# Patient Record
Sex: Female | Born: 1995 | Hispanic: Yes | Marital: Married | Smoking: Never smoker
Health system: Southern US, Community
[De-identification: ages and names within clinical notes are randomized; demographics above are authoritative.]

## PROBLEM LIST (undated history)

## (undated) ENCOUNTER — Inpatient Hospital Stay (HOSPITAL_COMMUNITY): Payer: Self-pay

## (undated) DIAGNOSIS — Z789 Other specified health status: Secondary | ICD-10-CM

## (undated) DIAGNOSIS — F329 Major depressive disorder, single episode, unspecified: Secondary | ICD-10-CM

## (undated) DIAGNOSIS — F419 Anxiety disorder, unspecified: Secondary | ICD-10-CM

## (undated) DIAGNOSIS — O24419 Gestational diabetes mellitus in pregnancy, unspecified control: Secondary | ICD-10-CM

## (undated) DIAGNOSIS — K219 Gastro-esophageal reflux disease without esophagitis: Secondary | ICD-10-CM

## (undated) DIAGNOSIS — F32A Depression, unspecified: Secondary | ICD-10-CM

## (undated) DIAGNOSIS — E119 Type 2 diabetes mellitus without complications: Secondary | ICD-10-CM

## (undated) HISTORY — DX: Depression, unspecified: F32.A

## (undated) HISTORY — PX: NO PAST SURGERIES: SHX2092

## (undated) HISTORY — PX: UPPER GASTROINTESTINAL ENDOSCOPY: SHX188

## (undated) HISTORY — DX: Gastro-esophageal reflux disease without esophagitis: K21.9

## (undated) HISTORY — PX: COLONOSCOPY: SHX174

---
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
## 2013-06-29 ENCOUNTER — Ambulatory Visit: Payer: Self-pay

## 2013-06-30 ENCOUNTER — Ambulatory Visit: Payer: Self-pay

## 2013-07-28 ENCOUNTER — Ambulatory Visit: Payer: Self-pay

## 2013-11-17 ENCOUNTER — Encounter (HOSPITAL_COMMUNITY): Payer: Self-pay | Admitting: Emergency Medicine

## 2013-11-17 ENCOUNTER — Emergency Department (HOSPITAL_COMMUNITY)
Admission: EM | Admit: 2013-11-17 | Discharge: 2013-11-18 | Disposition: A | Discharge status: Home / Self Care | Payer: Self-pay | Attending: Emergency Medicine | Admitting: Emergency Medicine

## 2013-11-17 DIAGNOSIS — R1032 Left lower quadrant pain: Secondary | ICD-10-CM | POA: Insufficient documentation

## 2013-11-17 DIAGNOSIS — Z3202 Encounter for pregnancy test, result negative: Secondary | ICD-10-CM | POA: Insufficient documentation

## 2013-11-17 DIAGNOSIS — R10814 Left lower quadrant abdominal tenderness: Secondary | ICD-10-CM

## 2013-11-17 LAB — CBC WITH DIFFERENTIAL/PLATELET
BASOS ABS: 0 10*3/uL (ref 0.0–0.1)
BASOS PCT: 1 % (ref 0–1)
EOS PCT: 2 % (ref 0–5)
Eosinophils Absolute: 0.1 10*3/uL (ref 0.0–0.7)
HEMATOCRIT: 36.2 % (ref 36.0–46.0)
Hemoglobin: 11.8 g/dL — ABNORMAL LOW (ref 12.0–15.0)
LYMPHS PCT: 53 % — AB (ref 12–46)
Lymphs Abs: 3.3 10*3/uL (ref 0.7–4.0)
MCH: 27.7 pg (ref 26.0–34.0)
MCHC: 32.6 g/dL (ref 30.0–36.0)
MCV: 85 fL (ref 78.0–100.0)
MONO ABS: 0.5 10*3/uL (ref 0.1–1.0)
Monocytes Relative: 7 % (ref 3–12)
NEUTROS ABS: 2.3 10*3/uL (ref 1.7–7.7)
Neutrophils Relative %: 37 % — ABNORMAL LOW (ref 43–77)
PLATELETS: 165 10*3/uL (ref 150–400)
RBC: 4.26 MIL/uL (ref 3.87–5.11)
RDW: 14 % (ref 11.5–15.5)
WBC: 6.2 10*3/uL (ref 4.0–10.5)

## 2013-11-17 LAB — URINALYSIS, ROUTINE W REFLEX MICROSCOPIC
Bilirubin Urine: NEGATIVE
Glucose, UA: NEGATIVE mg/dL
Hgb urine dipstick: NEGATIVE
KETONES UR: NEGATIVE mg/dL
LEUKOCYTES UA: NEGATIVE
Nitrite: NEGATIVE
PH: 6.5 (ref 5.0–8.0)
PROTEIN: NEGATIVE mg/dL
SPECIFIC GRAVITY, URINE: 1.022 (ref 1.005–1.030)
UROBILINOGEN UA: 0.2 mg/dL (ref 0.0–1.0)

## 2013-11-17 LAB — BASIC METABOLIC PANEL
ANION GAP: 10 (ref 5–15)
BUN: 14 mg/dL (ref 6–23)
CALCIUM: 8.8 mg/dL (ref 8.4–10.5)
CHLORIDE: 106 meq/L (ref 96–112)
CO2: 23 meq/L (ref 19–32)
CREATININE: 0.49 mg/dL — AB (ref 0.50–1.10)
GFR calc non Af Amer: >90 mL/min (ref >90)
Glucose, Bld: 93 mg/dL (ref 70–99)
Potassium: 4.3 mEq/L (ref 3.7–5.3)
SODIUM: 139 meq/L (ref 137–147)

## 2013-11-17 LAB — POC URINE PREG, ED: Preg Test, Ur: NEGATIVE

## 2013-11-17 NOTE — ED Notes (Signed)
Some pain otherwise comfortable

## 2013-11-17 NOTE — ED Provider Notes (Signed)
CSN: [id number]     Arrival date & time 11/17/13  1810 History   First MD Initiated Contact with Patient 11/17/13 2206     No chief complaint on file.    (Consider location/radiation/quality/duration/timing/severity/associated sxs/prior Treatment) HPI Jamie Lutz is a 18 y.o. female without significant PMH presenting with intermittent daily sharp LLQ or RLQ abdominal pain since May 2014 after she gave birth to her second child. Pain lasts for an hour. Pt denies anything making it better or worse. Pt states pain is worsening today in LLQ starting around 8am without relief with ibuprofen causing her to leave work. Pt noted that she has increased urinary frequency but denies dysuria, foul smell to urine or difficulty urinating. Pain not associated with menstrual cycle. LMP 10/28/13. Pt with no increased vaginal discharge or odor. Pt does note dyspareunia. Last BM yesterday and normal. Non bloody. Patient eating and drinking like normal. [facility] and tested for UTI and was unremarkable. Pt followed [place] Dr. Christin BachJohn Ferguson [place].    Past Medical History  Diagnosis Date  . No pertinent past medical history    Past Surgical History  Procedure Laterality Date  . No past surgeries    . Perineal laceration repair  04/17/2011    Procedure: SUTURE REPAIR PERINEAL LACERATION;  Surgeon: Tilda BurrowJohn V Ferguson, MD;  Location: [facility];  Service: Gynecology;  Laterality: N/A;   Family History  Problem Relation Age of Onset  . Diabetes Father    History  Substance Use Topics  . Smoking status: Never Smoker   . Smokeless tobacco: Never Used  . Alcohol Use: No   OB History   Grav Para Term Preterm Abortions TAB SAB Ect Mult Living   2 2 2  0 0 0 0 0 0 2     Review of Systems  Constitutional: Negative for fever and chills.  HENT: Negative for congestion and rhinorrhea.   Eyes: Negative for visual disturbance.  Respiratory: Negative for cough and  shortness of breath.   Cardiovascular: Negative for chest pain and palpitations.  Gastrointestinal: Positive for abdominal pain. Negative for nausea, vomiting and diarrhea.  Genitourinary: Negative for dysuria and hematuria.  Musculoskeletal: Negative for back pain and gait problem.  Skin: Negative for rash.  Neurological: Negative for weakness and headaches.      Allergies  Review of patient's allergies indicates no known allergies.  Home Medications   Prior to Admission medications   Medication Sig Start Date End Date Taking? Authorizing Provider  etonogestrel (IMPLANON) 68 MG IMPL implant Inject 1 each into the skin once.    Historical Provider, MD   BP 98/60  Pulse 80  Temp(Src) 98.3 F (36.8 C) (Oral)  Resp 18  Ht 5\' 4"  (1.626 m)  Wt 138 lb (62.596 kg)  BMI 23.68 kg/m2  SpO2 99%  LMP 11/08/2013 Physical Exam  Nursing note and vitals reviewed. Constitutional: She appears well-developed and well-nourished. No distress.  HENT:  Head: Normocephalic and atraumatic.  Eyes: Conjunctivae and EOM are normal. Right eye exhibits no discharge. Left eye exhibits no discharge.  Cardiovascular: Normal rate, regular rhythm and normal heart sounds.   Pulmonary/Chest: Effort normal and breath sounds normal. No respiratory distress. She has no wheezes.  Abdominal: Soft. Bowel sounds are normal. She exhibits no distension.  Mild LLQ tenderness without rebound, guarding, rigidity. No CVA tenderness.  Genitourinary:  Cervix pink without lesions. Os closed. No CMT mild left adnexal tenderness. Minimal discharge  without foul odor. Nursing in room for exam.  Neurological: She is alert. She exhibits normal muscle tone. Coordination normal.  Skin: Skin is warm and dry. She is not diaphoretic.    ED Course  Procedures (including critical care time) Labs Review Labs Reviewed  URINALYSIS, ROUTINE W REFLEX MICROSCOPIC - Abnormal; Notable for the following:    APPearance CLOUDY (*)    All  other components within normal limits  CBC WITH DIFFERENTIAL - Abnormal; Notable for the following:    Hemoglobin 11.8 (*)    Neutrophils Relative % 37 (*)    Lymphocytes Relative 53 (*)    All other components within normal limits  BASIC METABOLIC PANEL - Abnormal; Notable for the following:    Creatinine, Ser 0.49 (*)    All other components within normal limits  WET PREP, GENITAL  GC/CHLAMYDIA PROBE AMP  RPR  HIV ANTIBODY (ROUTINE TESTING)  POC URINE PREG, ED    Imaging Review No results found.   EKG Interpretation None      MDM   Final diagnoses:  LLQ abdominal tenderness   Pt with chronic abdominal pain with acute worsening today in LLQ. Pt with VSS. Mild LLQ abdominal tenderness and pelvic without significant discharge, no CMT with mild L adnexal tenderness. Pt without leukocytosis and UA without signs of infection. Wet prep and transvaginal US to rule out torsion pending. If testing unremarkable patient to follow up with Dr. Christin BachJohn Ferguson, [facility] for follow up.   Sign out to Dr. Mora Bellmanni at shift change.     Louann SjogrenVictoria L Sheril Hammond, PA-C 11/18/13 [phone]

## 2013-11-17 NOTE — ED Notes (Signed)
Pt reports pain in lower abdomen since July 2014 with increased difficulty urinating. Pt states that the pain caused her to leave work today because it was too painful to stand. Pt states that [facility] and was tested for a UTI (results were negative for UTI). Pt denies hematuria.

## 2013-11-18 ENCOUNTER — Emergency Department (HOSPITAL_COMMUNITY): Payer: Medicaid Other

## 2013-11-18 LAB — WET PREP, GENITAL
Trich, Wet Prep: NONE SEEN
YEAST WET PREP: NONE SEEN

## 2013-11-18 LAB — RPR

## 2013-11-18 LAB — HIV ANTIBODY (ROUTINE TESTING W REFLEX): HIV: NONREACTIVE

## 2013-11-18 MED ORDER — IBUPROFEN 800 MG PO TABS
800.0000 mg | ORAL_TABLET | Freq: Once | ORAL | Status: AC
  Administered 2013-11-18: 800 mg via ORAL
  Filled 2013-11-18: qty 1

## 2013-11-18 NOTE — ED Notes (Signed)
Patient transported to Ultrasound 

## 2013-11-18 NOTE — Discharge Instructions (Signed)
Return to the emergency room with worsening of symptoms, new symptoms or with symptoms that are concerning. Follow up with your OBGYN Dr. Christin BachJohn Ferguson. Call to make appointment as soon as possible. Number above.   Dolor abdominal en las mujeres (Abdominal Pain, Women) El dolor abdominal (en el estmago, la pelvis o el vientre) puede tener muchas causas. Es importante que le informe a su mdico:  La ubicacin del Engineer, miningdolor.  Viene y se va, o persiste todo el tiempo?  Hay situaciones que Location managerinician el dolor (comer ciertos alimentos, la actividad fsica)?  Tiene otros sntomas asociados al dolor (fiebre, nuseas, vmitos, diarrea)? Todo es de gran ayuda cuando se trata de hallar la causa del dolor. CAUSAS  Estmago: Infecciones por virus o bacterias, o lcera.  Intestino: Apendicitis (apndice inflamado), ileitis regional (enfermedad de Crohn), colitis ulcerosa (colon inflamado), sndrome del colon irritable, diverticulitis (inflamacin de los divertculos del colon) o cncer de estmago oo intestino.  Enfermedades de la vescula biliar o clculos.  Enfermedades renales, clculos o infecciones en el rin.  Infeccin o cncer del pncreas.  Fibromialgia (trastorno doloroso)  Enfermedades de los rganos femeninos:  Uterus: tero: fibroma (tumor no canceroso) o infeccin  Trompas de Falopio: infeccin o embarazo ectpico  En los ovarios, quistes o tumores.  Adherencias plvicas (tejido cicatrizal).  Endometriosis (el tejido que cubre el tero se desarrolla en la pelvis y los rganos plvicos).  Sndrome de Agricultural engineercongestin plvica (los rganos femeninos se llenan de sangre antes del periodo menstrual(  Dolor durante el periodo menstrual.  Dolor durante la ovulacin (al producir vulos).  Dolor al usar el DIU (dispositivo intrauterino para el control de la natalidad)  Psychologist, clinicalCncer en los rganos femeninos.  Dolor funcional (no est originado en una enfermedad, puede mejorar sin  tratamiento).  Dolor de origen psicolgico  Depresin. DIAGNSTICO Su mdico decidir la gravedad del dolor a travs del examen fsico  Anlisis de sangre  Radiografas  Ecografas  TC (tomografa computada, tipo especial de radiografas).  IMR (resonancia magntica)  Cultivos, en el caso una infeccin  Colon por enema de bario (se inserta una sustancia de contraste en el intestino grueso para mejorar la observacin con rayos X.)  Colonoscopa (observacin del intestino con un tubo luminoso).  Laparoscopa (examen del interior del abdomen con un tubo que tiene Intel Corporationuna luz).  Ciruga exploratoria abdominal mayor (se observa el abdomen realizando una gran incisin). TRATAMIENTO El tratamiento depender de la causa del problema.   Muchos de estos casos pueden controlarse y tratarse en casa.  Medicamentos de venta libre indicados por el mdico.  Medicamentos con receta.  Antibiticos, en caso de infeccin  Pldoras anticonceptivas, en el caso de perodos dolorosos o dolor al ovular.  Tratamiento hormonal, para la endometriosis  Inyecciones para bloqueo nervioso selectivo.  Fisioterapia.  Antidepresivos.  Consejos por parte de un psclogo o psiquiatra.  Ciruga mayor o menor. INSTRUCCIONES PARA EL CUIDADO DOMICILIARIO  No tome ni administre laxantes a menos que se lo haya indicado su mdico.  Tome analgsicos de venta libre slo si se lo ha indicado el profesional que lo asiste. No tome aspirina, ya que puede causar Apple Computermolestias en el estmago o hemorragias.  Consuma una dieta lquida (caldo o agua) segn lo indicado por el mdico. Progrese lentamente a una dieta blanda, segn la tolerancia, si el dolor se relaciona con el estmago o el intestino.  Tenga un termmetro y tmese la temperatura varias veces al da.  Haga reposo en la cama y [place] Palm Estatesduerma, si esto  alivia el dolor.  Evite las relaciones sexuales, Counsellorsi le producen dolor.  Evite las situaciones  estresantes.  Cumpla con las visitas y los anlisis de control, segn las indicaciones de su mdico.  Si el dolor no se [place]ivia con los medicamentos o la [place]erciruga, [place]puede tratar con:  Acupuntura.  Ejercicios de relajacin (yoga, meditacin).  Terapia grupal.  Psicoterapia. SOLICITE ATENCIN MDICA SI:  Nota que ciertos Pharmacist, communityalimentos le producen dolor de [place] Gardensestmago.  El tratamiento indicado para Arboriculturistrealizar en el hogar no Marketing executivele alivia el dolor.  Necesita analgsicos ms fuertes.  Quiere que le retiren el DIU.  Si se siente confundido o desfalleciente.  Presenta nuseas o vmitos.  Aparece una erupcin cutnea.  Sufre efectos adversos o una reaccin alrgica debido a los medicamentos que toma. SOLICITE ATENCIN MDICA DE INMEDIATO SI:  El dolor persiste o se agrava.  Tiene fiebre.  Siente el dolor slo en algunos sectores del abdomen. Si se localiza en la zona derecha, posiblemente podra tratarse de apendicitis. En un adulto, si se localiza en la regin inferior izquierda del abdomen, podra tratarse de colitis o diverticulitis.  Hay sangre en las heces (deposiciones de color rojo brillante o negro alquitranado), con o sin vmitos.  Usted presenta sangre en la orina.  Siente escalofros con o sin fiebre.  Se desmaya. ASEGRESE QUE:   Comprende estas instrucciones.  Controlar su enfermedad.  Solicitar ayuda de inmediato si no mejora o si empeora. Document Released: 04/24/2008 Document Revised: 03/31/2011 [facility] Patient Information 2015 [employer], [place]. This information is not intended to replace advice given to you by your health care provider. Make sure you discuss any questions you have with your health care provider.

## 2013-11-18 NOTE — ED Provider Notes (Signed)
Patient signed out to me as pending transvaginal ultrasound to evaluate for left-sided torsion. She had acute left lower quadrant pain occurring at 8:30 PM this evening. She has a history of chronic bilateral pelvic pain however. She also had left adnexal tenderness on exam. Transvaginal ultrasound is negative for torsion or other abnormality. Upon my repeat assessment patient's abdomen is soft nontender in the left lower quadrant. I informed her to continue to history of pituitary surgery follow-up with her regular doctor regarding her chronic pelvic pain. Her vital signs remain within normal limits and she is safe for discharge.  Tomasita CrumbleAdeleke Calahan Pak, MD 11/18/13 [phone]

## 2013-11-18 NOTE — ED Notes (Signed)
Pelvic  Prods sent to lab  Waiting on the us tech

## 2013-11-18 NOTE — ED Provider Notes (Signed)
Medical screening examination/treatment/procedure(s) were conducted as a shared visit with non-physician practitioner(s) and myself.  I personally evaluated the patient during the encounter.   EKG Interpretation None       Results for orders placed during the hospital encounter of 11/17/13  URINALYSIS, ROUTINE W REFLEX MICROSCOPIC      Result Value Ref Range   Color, Urine YELLOW  YELLOW   APPearance CLOUDY (*) CLEAR   Specific Gravity, Urine 1.022  1.005 - 1.030   pH 6.5  5.0 - 8.0   Glucose, UA NEGATIVE  NEGATIVE mg/dL   Hgb urine dipstick NEGATIVE  NEGATIVE   Bilirubin Urine NEGATIVE  NEGATIVE   Ketones, ur NEGATIVE  NEGATIVE mg/dL   Protein, ur NEGATIVE  NEGATIVE mg/dL   Urobilinogen, UA 0.2  0.0 - 1.0 mg/dL   Nitrite NEGATIVE  NEGATIVE   Leukocytes, UA NEGATIVE  NEGATIVE  CBC WITH DIFFERENTIAL      Result Value Ref Range   WBC 6.2  4.0 - 10.5 K/uL   RBC 4.26  3.87 - 5.11 MIL/uL   Hemoglobin 11.8 (*) 12.0 - 15.0 g/dL   HCT 13.036.2  86.536.0 - 78.446.0 %   MCV 85.0  78.0 - 100.0 fL   MCH 27.7  26.0 - 34.0 pg   MCHC 32.6  30.0 - 36.0 g/dL   RDW 69.614.0  29.511.5 - 28.415.5 %   Platelets 165  150 - 400 K/uL   Neutrophils Relative % 37 (*) 43 - 77 %   Neutro Abs 2.3  1.7 - 7.7 K/uL   Lymphocytes Relative 53 (*) 12 - 46 %   Lymphs Abs 3.3  0.7 - 4.0 K/uL   Monocytes Relative 7  3 - 12 %   Monocytes Absolute 0.5  0.1 - 1.0 K/uL   Eosinophils Relative 2  0 - 5 %   Eosinophils Absolute 0.1  0.0 - 0.7 K/uL   Basophils Relative 1  0 - 1 %   Basophils Absolute 0.0  0.0 - 0.1 K/uL  BASIC METABOLIC PANEL      Result Value Ref Range   Sodium 139  137 - 147 mEq/L   Potassium 4.3  3.7 - 5.3 mEq/L   Chloride 106  96 - 112 mEq/L   CO2 23  19 - 32 mEq/L   Glucose, Bld 93  70 - 99 mg/dL   BUN 14  6 - 23 mg/dL   Creatinine, Ser 1.320.49 (*) 0.50 - 1.10 mg/dL   Calcium 8.8  8.4 - 44.010.5 mg/dL   GFR calc non Af Amer >90  >90 mL/min   GFR calc Af Amer >90  >90 mL/min   Anion gap 10  5 - 15  POC URINE PREG,  ED      Result Value Ref Range   Preg Test, Ur NEGATIVE  NEGATIVE   Patient with several month history of pelvic complaints. Patient with acute onset of pain in the pelvic area mostly on the left side today. Much more severe than usual. That started about 8:30 in the morning. Patient was [facility] yesterday tested for urinary tract infection and it was negative. Patient has been followed by Dr. Emelda FearFerguson at [facility] [place] in the past. He has helped her with her first pregnancy. Patient due to the pain on the left side of the pelvis the concern is for torsion. Certainly does not explain her chronic pain over the past several months. Ultrasound ordered to rule out  torsion. Wet prep pending. If ultrasound negative for torsion patient will be discharged home with follow-up with Dr. Emelda FearFerguson.  On examination normal bowel sounds lungs clear on both sides heart regular rate and rhythm without murmurs. Mild tenderness left side pelvis on abdominal palpation. Also reported by mid-level that there was left adnexal tenderness. No significant discharge.  Basic metabolic panel negative pregnancy test negative urinalysis negative for urinary tract infection. No leukocytosis and no significant anemia.  Vanetta MuldersScott Amberlie Gaillard, MD 11/18/13 [phone]

## 2013-11-19 LAB — GC/CHLAMYDIA PROBE AMP
CT Probe RNA: NEGATIVE
GC PROBE AMP APTIMA: NEGATIVE

## 2013-11-21 ENCOUNTER — Encounter (HOSPITAL_COMMUNITY): Payer: Self-pay | Admitting: Emergency Medicine

## 2013-11-23 ENCOUNTER — Ambulatory Visit: Payer: Self-pay | Admitting: Obstetrics & Gynecology

## 2014-02-02 ENCOUNTER — Ambulatory Visit (INDEPENDENT_AMBULATORY_CARE_PROVIDER_SITE_OTHER): Payer: Self-pay | Admitting: Pediatrics

## 2014-02-02 DIAGNOSIS — Z30013 Encounter for initial prescription of injectable contraceptive: Secondary | ICD-10-CM

## 2014-02-02 DIAGNOSIS — Z113 Encounter for screening for infections with a predominantly sexual mode of transmission: Secondary | ICD-10-CM

## 2014-02-02 DIAGNOSIS — Z638 Other specified problems related to primary support group: Secondary | ICD-10-CM

## 2014-02-02 DIAGNOSIS — IMO0001 Reserved for inherently not codable concepts without codable children: Secondary | ICD-10-CM

## 2014-02-02 DIAGNOSIS — Z3202 Encounter for pregnancy test, result negative: Secondary | ICD-10-CM

## 2014-02-02 LAB — POCT URINE PREGNANCY: PREG TEST UR: NEGATIVE

## 2014-02-02 MED ORDER — MEDROXYPROGESTERONE ACETATE 150 MG/ML IM SUSP
150.0000 mg | Freq: Once | INTRAMUSCULAR | Status: AC
  Administered 2014-02-02: 150 mg via INTRAMUSCULAR

## 2014-02-03 DIAGNOSIS — IMO0001 Reserved for inherently not codable concepts without codable children: Secondary | ICD-10-CM | POA: Insufficient documentation

## 2014-02-03 LAB — GC/CHLAMYDIA PROBE AMP, URINE
Chlamydia, Swab/Urine, PCR: NEGATIVE
GC Probe Amp, Urine: NEGATIVE

## 2014-02-03 NOTE — Progress Notes (Signed)
  Subjective:    Jamie Lutz is a 19 y.o. old female here with her self for birth control.   HPI Here at child's appt. Desires birth control. Has previously had Nexplanon but didn't like it (excessive bleeding) so had it removed. Currently uses condoms. LMP 01/08/14.  No unprotected sex since LMP. Last sex approx 2 weeks ago, used a condom.  Also with h/o of some lower pelvic pain. Some what difficult to quantify or qualify. On chart review, went to [facility] approx 18 months ago with pain and had normal u/s to r/o torsed ovary. [facility] but wondering if she can get care here.    Review of Systems  Genitourinary: Negative for vaginal bleeding, vaginal pain and menstrual problem.    Immunizations needed: vaccine record not available     Objective:    There were no vitals taken for this visit. Physical Exam  Constitutional: She appears well-nourished. No distress.  HENT:  Head: Normocephalic and atraumatic.  Eyes: Conjunctivae and EOM are normal. Right eye exhibits no discharge. Left eye exhibits no discharge.  Neck: Normal range of motion.  Pulmonary/Chest: No respiratory distress.  Skin: Skin is warm and dry. No rash noted.  Nursing note and vitals reviewed.      Assessment and Plan:     Jamie Lutz was seen today for contraceptive management. .   Problem List Items Addressed This Visit    None    Visit Diagnoses    Negative pregnancy test    -  Primary    Relevant Orders    POCT urine pregnancy (Completed)    GC/chlamydia probe amp, urine (Completed)    Ambulatory referral to Adolescent Medicine    Encounter for initial prescription of injectable contraceptive          Discussed options including Nexplanon, Depo and IUD.  Would consider IUD placement but would like to think about it. Referred to Dr Marina GoodellPerry for possible IUD placement.  In the meantime, will cover with Depo today.   Referred to Dr Marina GoodellPerry - appt scheduled  for next month - possible IUD placement.  Dory PeruBROWN,Ambrose Wile R, MD

## 2014-02-06 ENCOUNTER — Encounter: Payer: Self-pay | Admitting: Pediatrics

## 2014-02-06 ENCOUNTER — Ambulatory Visit (INDEPENDENT_AMBULATORY_CARE_PROVIDER_SITE_OTHER): Payer: Self-pay | Admitting: Pediatrics

## 2014-02-06 VITALS — BP 104/82 | Temp 97.7°F | Wt 140.0 lb

## 2014-02-06 DIAGNOSIS — R350 Frequency of micturition: Secondary | ICD-10-CM

## 2014-02-06 DIAGNOSIS — R202 Paresthesia of skin: Secondary | ICD-10-CM

## 2014-02-06 LAB — POCT URINALYSIS DIPSTICK
BILIRUBIN UA: NEGATIVE
GLUCOSE UA: NORMAL
Ketones, UA: NEGATIVE
LEUKOCYTES UA: NEGATIVE
NITRITE UA: NEGATIVE
Protein, UA: NEGATIVE
RBC UA: NEGATIVE
Spec Grav, UA: 1.01
Urobilinogen, UA: NEGATIVE
pH, UA: 5

## 2014-02-06 LAB — CBC
HEMATOCRIT: 40.1 % (ref 36.0–46.0)
HEMOGLOBIN: 13.4 g/dL (ref 12.0–15.0)
MCH: 28 pg (ref 26.0–34.0)
MCHC: 33.4 g/dL (ref 30.0–36.0)
MCV: 83.7 fL (ref 78.0–100.0)
MPV: 11.1 fL (ref 8.6–12.4)
Platelets: 179 10*3/uL (ref 150–400)
RBC: 4.79 MIL/uL (ref 3.87–5.11)
RDW: 14.4 % (ref 11.5–15.5)
WBC: 6.8 10*3/uL (ref 4.0–10.5)

## 2014-02-06 NOTE — Progress Notes (Signed)
Subjective:    Jamie Lutz is a 19 y.o. old female here for Numbness .    HPI Comments: Pt comes in today for paresthesia in arms and legs that have been occuring for past 2 weeks. She report "pin and needle" sensation in her distal extremities a few times a day mainly when sitting on toilet or chairs that last for a few minutes and resolves with movement. Denies any numbness or weakness. She also reports multiple other problems. Freq urination that has been occuring for ~ 2 years without dysuria, polydipsia, or nocturia. Left pelvic pain x 10 months that is dull constant and helped some by mobic. Mild HA almost daily without photophobia, phonophobia or aura and doesn't need treatment. Also well as Dizziness a few times a week. On further questioning she report panic attacks that occur several times a week: extreme anxiety without known triggers, palpitation, trouble breathing - which last for 5-10 minutes and resolves without treatment. She says this started ~ 1.5 yrs ago during her second pregnancy and has been associated with cold intolerance, mild constipation and weight gain. Denies hx of thyroid problems, but sister has unknown thyroid issues.     Review of Systems  Constitutional: Negative for fever, appetite change and unexpected weight change.  Respiratory: Negative for cough, chest tightness and shortness of breath.   Cardiovascular: Positive for palpitations. Negative for chest pain.  Gastrointestinal: Positive for abdominal pain and constipation. Negative for nausea, vomiting and diarrhea.  Endocrine: Positive for cold intolerance and polyuria. Negative for heat intolerance, polydipsia and polyphagia.  Genitourinary: Positive for frequency. Negative for dysuria, difficulty urinating and menstrual problem.  Musculoskeletal: Positive for myalgias. Negative for arthralgias.  Skin: Negative for rash.  Neurological: Positive for dizziness and headaches. Negative for weakness and numbness.   Psychiatric/Behavioral: Positive for decreased concentration. Negative for dysphoric mood. The patient is nervous/anxious.     History and Problem List: Jamie Lutz has Teenage mother and Paresthesia on her problem list.  Jamie Lutz  has a past medical history of No pertinent past medical history.  Immunizations needed: none     Objective:    BP 104/82 mmHg  Temp(Src) 97.7 F (36.5 C)  Wt 140 lb (63.504 kg) Physical Exam  Constitutional: She is oriented to person, place, and time. She appears well-developed and well-nourished. No distress.  HENT:  Mouth/Throat: Oropharynx is clear and moist.  Eyes: Conjunctivae and EOM are normal. Pupils are equal, round, and reactive to light.  Neck: Normal range of motion. Neck supple. No thyromegaly present.  Cardiovascular: Normal rate, regular rhythm and normal heart sounds.   No murmur heard. Pulmonary/Chest: Effort normal and breath sounds normal.  Abdominal: Soft. Bowel sounds are normal. She exhibits no distension.  Mild LLQ tenderness   Musculoskeletal: Normal range of motion. She exhibits no edema or tenderness.  Neurological: She is alert and oriented to person, place, and time. No cranial nerve deficit. Coordination normal.  Upper and lower sensation and strength grossly intact   Skin: Skin is warm. She is not diaphoretic.       Assessment and Plan:     Jamie Lutz was seen today for numbness which she describes as paresthesias. Also with multiple other complaints. Chronic pelvic pain, panic attacks, anxiety, urinary frequency, and mild daily HA. No decreased sensory or strength that would be concerning for neuromuscular disease. No murmur concern for mitral valve prolapse. Constellation of symptoms likely due to anxiety, but will check TSH to assess thyroid and CBC, BMP to assess for  anemia or electrolyte abnormalities. Low suspicion for rheumatological pathology; will check ESR which if wnl would be reassuring. Recommend miralax to treat  constipation which could be contributing to LLQ pain and urinary frequency. We have her f/u with Jamie Lutz as scheduled to discuss lab results.  If no lab abnormalities found would consider treating with SSRI.   Problem List Items Addressed This Visit      Other   Paresthesia   Relevant Orders   CBC   Basic metabolic panel   TSH   Ferritin   Sedimentation rate    Other Visit Diagnoses    Frequency of urination    -  Primary    Relevant Orders    POCT urinalysis dipstick (Completed)       Return in about 1 month (around 03/09/2014).  Phill Myron, MD

## 2014-02-06 NOTE — Patient Instructions (Signed)
I have order some labs today to assess your numbness. You should follow-up with Dr Marina GoodellPerry as already scheduled to discuss this results. For you occasional constipation you can use over the counter Miralax as needed.

## 2014-02-06 NOTE — Progress Notes (Signed)
I reviewed the resident's note and agree with the findings and plan. Corran Lalone, PPCNP-BC  

## 2014-02-07 LAB — BASIC METABOLIC PANEL
BUN: 9 mg/dL (ref 6–23)
CALCIUM: 8.6 mg/dL (ref 8.4–10.5)
CO2: 23 mEq/L (ref 19–32)
CREATININE: 0.47 mg/dL — AB (ref 0.50–1.10)
Chloride: 105 mEq/L (ref 96–112)
Glucose, Bld: 62 mg/dL — ABNORMAL LOW (ref 70–99)
POTASSIUM: 4.8 meq/L (ref 3.5–5.3)
Sodium: 136 mEq/L (ref 135–145)

## 2014-02-07 LAB — SEDIMENTATION RATE: SED RATE: 4 mm/h (ref 0–22)

## 2014-02-07 LAB — TSH: TSH: 0.514 u[IU]/mL (ref 0.350–4.500)

## 2014-02-07 LAB — FERRITIN: Ferritin: 35 ng/mL (ref 10–291)

## 2014-02-25 ENCOUNTER — Encounter: Payer: Self-pay | Admitting: Licensed Clinical Social Worker

## 2014-03-14 ENCOUNTER — Institutional Professional Consult (permissible substitution): Payer: Self-pay | Admitting: Pediatrics

## 2014-03-20 ENCOUNTER — Encounter (HOSPITAL_COMMUNITY): Payer: Self-pay | Admitting: Emergency Medicine

## 2014-03-20 ENCOUNTER — Emergency Department (HOSPITAL_COMMUNITY)
Admission: EM | Admit: 2014-03-20 | Discharge: 2014-03-20 | Disposition: A | Discharge status: Home / Self Care | Payer: Self-pay | Attending: Emergency Medicine | Admitting: Emergency Medicine

## 2014-03-20 ENCOUNTER — Emergency Department (HOSPITAL_COMMUNITY): Payer: Medicaid Other

## 2014-03-20 DIAGNOSIS — R1032 Left lower quadrant pain: Secondary | ICD-10-CM

## 2014-03-20 DIAGNOSIS — N760 Acute vaginitis: Secondary | ICD-10-CM | POA: Insufficient documentation

## 2014-03-20 DIAGNOSIS — R350 Frequency of micturition: Secondary | ICD-10-CM

## 2014-03-20 DIAGNOSIS — B9689 Other specified bacterial agents as the cause of diseases classified elsewhere: Secondary | ICD-10-CM

## 2014-03-20 DIAGNOSIS — Z3202 Encounter for pregnancy test, result negative: Secondary | ICD-10-CM | POA: Insufficient documentation

## 2014-03-20 DIAGNOSIS — N898 Other specified noninflammatory disorders of vagina: Secondary | ICD-10-CM

## 2014-03-20 LAB — WET PREP, GENITAL
TRICH WET PREP: NONE SEEN
Yeast Wet Prep HPF POC: NONE SEEN

## 2014-03-20 LAB — COMPREHENSIVE METABOLIC PANEL
ALT: 15 U/L (ref 0–35)
AST: 18 U/L (ref 0–37)
Albumin: 4.1 g/dL (ref 3.5–5.2)
Alkaline Phosphatase: 89 U/L (ref 39–117)
Anion gap: 4 — ABNORMAL LOW (ref 5–15)
BUN: 12 mg/dL (ref 6–23)
CALCIUM: 8.9 mg/dL (ref 8.4–10.5)
CHLORIDE: 109 mmol/L (ref 96–112)
CO2: 24 mmol/L (ref 19–32)
Creatinine, Ser: 0.66 mg/dL (ref 0.50–1.10)
GFR calc Af Amer: >90 mL/min (ref >90)
GFR calc non Af Amer: >90 mL/min (ref >90)
Glucose, Bld: 90 mg/dL (ref 70–99)
POTASSIUM: 3.9 mmol/L (ref 3.5–5.1)
Sodium: 137 mmol/L (ref 135–145)
Total Bilirubin: 0.5 mg/dL (ref 0.3–1.2)
Total Protein: 7.3 g/dL (ref 6.0–8.3)

## 2014-03-20 LAB — URINALYSIS, ROUTINE W REFLEX MICROSCOPIC
BILIRUBIN URINE: NEGATIVE
GLUCOSE, UA: NEGATIVE mg/dL
KETONES UR: NEGATIVE mg/dL
Leukocytes, UA: NEGATIVE
Nitrite: NEGATIVE
PROTEIN: NEGATIVE mg/dL
Specific Gravity, Urine: 1.022 (ref 1.005–1.030)
UROBILINOGEN UA: 0.2 mg/dL (ref 0.0–1.0)
pH: 6 (ref 5.0–8.0)

## 2014-03-20 LAB — CBC WITH DIFFERENTIAL/PLATELET
BASOS ABS: 0 10*3/uL (ref 0.0–0.1)
Basophils Relative: 0 % (ref 0–1)
EOS PCT: 2 % (ref 0–5)
Eosinophils Absolute: 0.2 10*3/uL (ref 0.0–0.7)
HEMATOCRIT: 40.7 % (ref 36.0–46.0)
Hemoglobin: 13.6 g/dL (ref 12.0–15.0)
LYMPHS PCT: 41 % (ref 12–46)
Lymphs Abs: 3.2 10*3/uL (ref 0.7–4.0)
MCH: 28.8 pg (ref 26.0–34.0)
MCHC: 33.4 g/dL (ref 30.0–36.0)
MCV: 86 fL (ref 78.0–100.0)
MONO ABS: 0.6 10*3/uL (ref 0.1–1.0)
Monocytes Relative: 8 % (ref 3–12)
Neutro Abs: 3.8 10*3/uL (ref 1.7–7.7)
Neutrophils Relative %: 49 % (ref 43–77)
Platelets: 185 10*3/uL (ref 150–400)
RBC: 4.73 MIL/uL (ref 3.87–5.11)
RDW: 13.7 % (ref 11.5–15.5)
WBC: 7.9 10*3/uL (ref 4.0–10.5)

## 2014-03-20 LAB — URINE MICROSCOPIC-ADD ON

## 2014-03-20 LAB — POC URINE PREG, ED: Preg Test, Ur: NEGATIVE

## 2014-03-20 LAB — LIPASE, BLOOD: Lipase: 34 U/L (ref 11–59)

## 2014-03-20 MED ORDER — MORPHINE SULFATE 4 MG/ML IJ SOLN
4.0000 mg | Freq: Once | INTRAMUSCULAR | Status: AC
  Administered 2014-03-20: 4 mg via INTRAVENOUS
  Filled 2014-03-20: qty 1

## 2014-03-20 MED ORDER — AZITHROMYCIN 250 MG PO TABS
1000.0000 mg | ORAL_TABLET | Freq: Once | ORAL | Status: AC
  Administered 2014-03-20: 1000 mg via ORAL
  Filled 2014-03-20: qty 4

## 2014-03-20 MED ORDER — METRONIDAZOLE 500 MG PO TABS: 500.0000 mg | ORAL_TABLET | Freq: Two times a day (BID) | ORAL | Status: DC

## 2014-03-20 MED ORDER — CEFTRIAXONE SODIUM 250 MG IJ SOLR
250.0000 mg | Freq: Once | INTRAMUSCULAR | Status: AC
  Administered 2014-03-20: 250 mg via INTRAMUSCULAR
  Filled 2014-03-20: qty 250

## 2014-03-20 MED ORDER — LIDOCAINE HCL (PF) 1 % IJ SOLN
2.0000 mL | Freq: Once | INTRAMUSCULAR | Status: AC
  Administered 2014-03-20: 0.9 mL
  Filled 2014-03-20: qty 5

## 2014-03-20 NOTE — ED Provider Notes (Signed)
CSN: [id number]     Arrival date & time 03/20/14  1607 History   First MD Initiated Contact with Patient 03/20/14 1811     Chief Complaint  Patient presents with  . Abdominal Pain     (Consider location/radiation/quality/duration/timing/severity/associated sxs/prior Treatment) HPI Comments: Jamie Lutz is a 19 y.o. healthy female, who presents to the ED with complaints of ongoing left lower quadrant abdominal pain which has been present for 1.5 years since giving birth, but over the last 3 days has worsened. Patient reports that it is currently a 9/10 constant left lower quadrant nonradiating dull cramping pain that worsens with urination and improves with ibuprofen. She reports for the last year she has had increased urinary frequency which worsens with caffeine before she avoids this. She endorses associated vaginal spotting, reports that she had a Depotprovera shot on 02/01/14 and LMP was 02/20/14. She also has yellow malodorous vaginal discharge, and some slight dysuria. She is fixated with one female partner, protected with condoms. She denies any fevers, chills, chest pain, shortness breath, nausea, vomiting, diarrhea, constipation, melena, hematochezia, constipation, rectal pain, numbness, tingling, weakness, hematuria, urinary urgency, vaginal itching or sores, sick contacts, recent travel, suspicious food intake, or alcohol use. Has been seen multiple times by several different providers for this same pain, and has no definitive source found at this time. Had a pelvic done 2wks ago with no infections found, and states the pain is very similar.   Patient is a 19 y.o. female presenting with abdominal pain. The history is provided by the patient. No language interpreter was used.  Abdominal Pain Pain location:  LLQ Pain quality: cramping and dull   Pain radiates to:  Does not radiate Pain severity:  Moderate Onset quality:  Gradual Duration:  3 days (ongoing x1.76yrs but worsened x3  days) Timing:  Constant Progression:  Unchanged Chronicity:  Chronic Context: not recent illness, not recent travel, not sick contacts, not suspicious food intake and not trauma   Relieved by:  NSAIDs Worsened by:  Urination Ineffective treatments:  None tried Associated symptoms: vaginal bleeding and vaginal discharge   Associated symptoms: no chest pain, no chills, no constipation, no diarrhea, no dysuria, no fever, no flatus, no hematemesis, no hematochezia, no hematuria, no melena, no nausea, no shortness of breath and no vomiting   Vaginal discharge:    Quality:  Yellow   Severity:  Mild   Onset quality:  Gradual   Duration:  3 days   Timing:  Constant   Progression:  Unchanged   Chronicity:  New   Past Medical History  Diagnosis Date  . No pertinent past medical history    Past Surgical History  Procedure Laterality Date  . No past surgeries    . Perineal laceration repair  04/17/2011    Procedure: SUTURE REPAIR PERINEAL LACERATION;  Surgeon: Tilda Burrow, MD;  Location: [facility];  Service: Gynecology;  Laterality: N/A;   Family History  Problem Relation Age of Onset  . Diabetes Father    History  Substance Use Topics  . Smoking status: Never Smoker   . Smokeless tobacco: Never Used  . Alcohol Use: No   OB History    Gravida Para Term Preterm AB TAB SAB Ectopic Multiple Living   2 2 2  0 0 0 0 0 0 2     Review of Systems  Constitutional: Negative for fever and chills.  Respiratory: Negative for shortness of breath.   Cardiovascular: Negative for chest pain.  Gastrointestinal: Positive for abdominal pain. Negative for nausea, vomiting, diarrhea, constipation, blood in stool, melena, hematochezia, abdominal distention, anal bleeding, rectal pain, flatus and hematemesis.  Genitourinary: Positive for frequency, vaginal bleeding, vaginal discharge and pelvic pain. Negative for dysuria, hematuria, flank pain and menstrual problem.  Musculoskeletal: Negative for  myalgias and arthralgias.  Skin: Negative for color change.  Allergic/Immunologic: Negative for immunocompromised state.  Neurological: Negative for weakness and numbness.  Psychiatric/Behavioral: Negative for confusion.   10 Systems reviewed and are negative for acute change except as noted in the HPI.    Allergies  Review of patient's allergies indicates no known allergies.  Home Medications   Prior to Admission medications   Medication Sig Start Date End Date Taking? Authorizing Provider  etonogestrel (IMPLANON) 68 MG IMPL implant Inject 1 each into the skin once.   Yes Historical Provider, MD   BP 107/62 mmHg  Pulse 84  Temp(Src) 98.3 F (36.8 C) (Oral)  Resp 18  Ht 5\' 4"  (1.626 m)  Wt 152 lb (68.947 kg)  BMI 26.08 kg/m2  SpO2 97%  LMP 02/20/2014 Physical Exam  Constitutional: She is oriented to person, place, and time. Vital signs are normal. She appears well-developed and well-nourished.  Non-toxic appearance. No distress.  Afebrile, nontoxic, NAD  HENT:  Head: Normocephalic and atraumatic.  Mouth/Throat: Oropharynx is clear and moist and mucous membranes are normal.  Eyes: Conjunctivae and EOM are normal. Right eye exhibits no discharge. Left eye exhibits no discharge.  Neck: Normal range of motion. Neck supple.  Cardiovascular: Normal rate, regular rhythm, normal heart sounds and intact distal pulses.  Exam reveals no gallop and no friction rub.   No murmur heard. Pulmonary/Chest: Effort normal and breath sounds normal. No respiratory distress. She has no decreased breath sounds. She has no wheezes. She has no rhonchi. She has no rales.  Abdominal: Soft. Normal appearance and bowel sounds are normal. She exhibits no distension. There is tenderness in the left lower quadrant. There is no rigidity, no rebound, no guarding, no CVA tenderness, no tenderness at McBurney's point and negative Murphy's sign.    Soft, nondistended, +BS throughout, mild LLQ TTP near pelvic  brim, no r/g/r, neg murphy's, neg mcburney's, no CVA TTP   Genitourinary: Uterus normal. Pelvic exam was performed with patient supine. There is no rash, tenderness or lesion on the right labia. There is no rash, tenderness or lesion on the left labia. Cervix exhibits friability. Cervix exhibits no motion tenderness and no discharge. Right adnexum displays no mass, no tenderness and no fullness. Left adnexum displays tenderness. Left adnexum displays no mass and no fullness. There is bleeding (dark blood in vaginal vault) in the vagina. No erythema in the vagina. Vaginal discharge found.  Chaperone present for exam. No rashes, lesions, or tenderness to external genitalia. No erythema, injury, or tenderness to vaginal mucosa. Slight mucoid vaginal discharge in vault, scant amount of dark blood within vaginal vault. No adnexal masses or fullness, mild L sided adnexal TTP. No CMT or discharge from cervical os, with slight friability noted. Uterus non-deviated, mobile, nonTTP, and without enlargement.    Musculoskeletal: Normal range of motion.  Neurological: She is alert and oriented to person, place, and time. She has normal strength. No sensory deficit.  Skin: Skin is warm, dry and intact. No rash noted.  Psychiatric: She has a normal mood and affect.  Nursing note and vitals reviewed.   ED Course  Procedures (including critical care time) Labs Review Labs Reviewed  WET  PREP, GENITAL - Abnormal; Notable for the following:    Clue Cells Wet Prep HPF POC FEW (*)    WBC, Wet Prep HPF POC FEW (*)    All other components within normal limits  URINALYSIS, ROUTINE W REFLEX MICROSCOPIC - Abnormal; Notable for the following:    Hgb urine dipstick SMALL (*)    All other components within normal limits  COMPREHENSIVE METABOLIC PANEL - Abnormal; Notable for the following:    Anion gap 4 (*)    All other components within normal limits  URINE MICROSCOPIC-ADD ON  CBC WITH DIFFERENTIAL/PLATELET  LIPASE,  BLOOD  RPR  HIV ANTIBODY (ROUTINE TESTING)  POC URINE PREG, ED  GC/CHLAMYDIA PROBE AMP ([place])    Imaging Review US Transvaginal Non-ob  03/20/2014   CLINICAL DATA:  Acute onset of left adnexal tenderness. Initial encounter.  EXAM: TRANSABDOMINAL AND TRANSVAGINAL ULTRASOUND OF PELVIS  DOPPLER ULTRASOUND OF OVARIES  TECHNIQUE: Both transabdominal and transvaginal ultrasound examinations of the pelvis were performed. Transabdominal technique was performed for global imaging of the pelvis including uterus, ovaries, adnexal regions, and pelvic cul-de-sac.  It was necessary to proceed with endovaginal exam following the transabdominal exam to visualize the uterus and ovaries in greater detail. Color and duplex Doppler ultrasound was utilized to evaluate blood flow to the ovaries.  COMPARISON:  None.  FINDINGS: Uterus  Measurements: 7.8 x 3.7 x 4.3 cm. No fibroids or other mass visualized. Mildly prominent arcuate vessels are noted, within normal limits.  Endometrium  Thickness: 0.4 cm.  No focal abnormality visualized.  Right ovary  Measurements: 3.4 x 1.3 x 1.7 cm. Normal appearance/no adnexal mass.  Left ovary  Measurements: 2.4 x 2.2 x 2.2 cm. Normal appearance/no adnexal mass.  Pulsed Doppler evaluation of both ovaries demonstrates normal low-resistance arterial and venous waveforms.  Other findings  No free fluid.  IMPRESSION: Unremarkable pelvic ultrasound.  No evidence for ovarian torsion.   Electronically Signed   By: Roanna Raider M.D.   On: 03/20/2014 20:26   US Pelvis Complete  03/20/2014   CLINICAL DATA:  Acute onset of left adnexal tenderness. Initial encounter.  EXAM: TRANSABDOMINAL AND TRANSVAGINAL ULTRASOUND OF PELVIS  DOPPLER ULTRASOUND OF OVARIES  TECHNIQUE: Both transabdominal and transvaginal ultrasound examinations of the pelvis were performed. Transabdominal technique was performed for global imaging of the pelvis including uterus, ovaries, adnexal regions, and pelvic cul-de-sac.   It was necessary to proceed with endovaginal exam following the transabdominal exam to visualize the uterus and ovaries in greater detail. Color and duplex Doppler ultrasound was utilized to evaluate blood flow to the ovaries.  COMPARISON:  None.  FINDINGS: Uterus  Measurements: 7.8 x 3.7 x 4.3 cm. No fibroids or other mass visualized. Mildly prominent arcuate vessels are noted, within normal limits.  Endometrium  Thickness: 0.4 cm.  No focal abnormality visualized.  Right ovary  Measurements: 3.4 x 1.3 x 1.7 cm. Normal appearance/no adnexal mass.  Left ovary  Measurements: 2.4 x 2.2 x 2.2 cm. Normal appearance/no adnexal mass.  Pulsed Doppler evaluation of both ovaries demonstrates normal low-resistance arterial and venous waveforms.  Other findings  No free fluid.  IMPRESSION: Unremarkable pelvic ultrasound.  No evidence for ovarian torsion.   Electronically Signed   By: Roanna Raider M.D.   On: 03/20/2014 20:26   [place] Art/ven Flow Abd Pelv Doppler  03/20/2014   CLINICAL DATA:  Acute onset of left adnexal tenderness. Initial encounter.  EXAM: TRANSABDOMINAL AND TRANSVAGINAL ULTRASOUND OF PELVIS  DOPPLER ULTRASOUND OF  OVARIES  TECHNIQUE: Both transabdominal and transvaginal ultrasound examinations of the pelvis were performed. Transabdominal technique was performed for global imaging of the pelvis including uterus, ovaries, adnexal regions, and pelvic cul-de-sac.  It was necessary to proceed with endovaginal exam following the transabdominal exam to visualize the uterus and ovaries in greater detail. Color and duplex Doppler ultrasound was utilized to evaluate blood flow to the ovaries.  COMPARISON:  None.  FINDINGS: Uterus  Measurements: 7.8 x 3.7 x 4.3 cm. No fibroids or other mass visualized. Mildly prominent arcuate vessels are noted, within normal limits.  Endometrium  Thickness: 0.4 cm.  No focal abnormality visualized.  Right ovary  Measurements: 3.4 x 1.3 x 1.7 cm. Normal appearance/no adnexal mass.  Left  ovary  Measurements: 2.4 x 2.2 x 2.2 cm. Normal appearance/no adnexal mass.  Pulsed Doppler evaluation of both ovaries demonstrates normal low-resistance arterial and venous waveforms.  Other findings  No free fluid.  IMPRESSION: Unremarkable pelvic ultrasound.  No evidence for ovarian torsion.   Electronically Signed   By: Roanna Raider M.D.   On: 03/20/2014 20:26     EKG Interpretation None      MDM   Final diagnoses:  Abdominal pain, left lower quadrant  Urinary frequency  Vaginal discharge  BV (bacterial vaginosis)    19 y.o. 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 Has been seen here for this before, with negative HIV/RPR/GC/CT testing. Given that she states it worsened since 3days ago, will obtain labs and perform pelvic. Will give some pain meds then reassess. Doubt acute emergent condition, but will assess need for imaging after pelvic. No CVA TTP, doubt pyelo or stones. U/A neg for UTI, Upreg neg. Will reassess shortly.   7:30 PM Pelvic showing some scant old blood in vault, with some mucoid discharge, some friability to cervix, no CMT, slight L adnexal tenderness. Will proceed with ultrasound. No labs returned yet, will reassess shortly.   8:55 PM Pain improved. Wet prep showing few clue cells, will give metronidazole for BV. CBC and CMP unremarkable. Lipase WNL. U/S unremarkable, no TOA. Highly doubt PID, will send home only on metronidazole and have her f/up with PCP or OBGYN for ongoing care of her chronic abd pain. Also discussed kegal exercises to see if weak pelvic floor muscles could explain her urinary frequency. Discussed diet modifications and use of tylenol/motrin for pain. I explained the diagnosis and have given explicit precautions to return to the ER including for any other new or worsening symptoms. The patient understands and accepts the medical plan as it's been dictated and I have answered their questions. Discharge instructions concerning home care and  prescriptions have been given. The patient is STABLE and is discharged to home in good condition.  BP 98/55 mmHg  Pulse 71  Temp(Src) 98.3 F (36.8 C) (Oral)  Resp 16  Ht  (1.626 m)  Wt 152 lb (68.947 kg)  BMI 26.08 kg/m2  SpO2 100%  LMP 02/20/2014  Meds ordered this encounter  Medications  . morphine 4 MG/ML injection 4 mg    Sig:   . azithromycin (ZITHROMAX) tablet 1,000 mg    Sig:    And  . cefTRIAXone (ROCEPHIN) injection 250 mg    Sig:     Order Specific Question:  Antibiotic Indication:    Answer:  STD  . lidocaine (PF) (XYLOCAINE) 1 % injection 2 mL    Sig:   . metroNIDAZOLE (FLAGYL) 500 MG tablet    Sig:  Take 1 tablet (500 mg total) by mouth 2 (two) times daily. One po bid x 7 days    Dispense:  14 tablet    Refill:  0    Order Specific Question:  Supervising Provider    Answer:  Eber Hong D [address] [place], PA-C 03/20/14 2105  Ethelda Chick, MD 03/20/14 2105

## 2014-03-20 NOTE — ED Notes (Signed)
Patient transported to Ultrasound 

## 2014-03-20 NOTE — ED Notes (Signed)
Pt reports LLQ pain since Friday that increases when she urinates. lmp-feb 1. Denies nvd.

## 2014-03-20 NOTE — Discharge Instructions (Signed)
Your symptoms could be related to weak pelvic floor muscles. Perform kegal exercises daily to help strengthen these. Use tylenol or motrin as needed for pain. Your vaginal discharge appears to be bacterial vaginosis. Take metronidazole as directed to help with this. Stay well hydrated. See your regular doctor in 1 week for recheck. Return to the ER for changes or worsening symptoms.   Abdominal (belly) pain can be caused by many things. Your caregiver performed an examination and possibly ordered blood/urine tests and imaging (CT scan, x-rays, ultrasound). Many cases can be observed and treated at home after initial evaluation in the emergency department. Even though you are being discharged home, abdominal pain can be unpredictable. Therefore, you need a repeated exam if your pain does not resolve, returns, or worsens. Most patients with abdominal pain don't have to be admitted to the hospital or have surgery, but serious problems like appendicitis and gallbladder attacks can start out as nonspecific pain. Many abdominal conditions cannot be diagnosed in one visit, so follow-up evaluations are very important. SEEK IMMEDIATE MEDICAL ATTENTION IF YOU DEVELOP ANY OF THE FOLLOWING SYMPTOMS:  The pain does not go away or becomes severe.   A temperature above 101 develops.   Repeated vomiting occurs (multiple episodes).   The pain becomes localized to portions of the abdomen. The right side could possibly be appendicitis. In an adult, the left lower portion of the abdomen could be colitis or diverticulitis.   Blood is being passed in stools or vomit (bright red or black tarry stools).   Return also if you develop chest pain, difficulty breathing, dizziness or fainting, or become confused, poorly responsive, or inconsolable (young children).  The constipation stays for more than 4 days.   There is belly (abdominal) or rectal pain.   You do not seem to be getting better.      Dolor  abdominal (Abdominal Pain) El dolor de estmago (abdominal) puede tener muchas causas. La mayora de las veces, el dolor de Prichardestmago no es peligroso. Muchos de Franklin Resourcesestos casos de dolor de estmago pueden controlarse y tratarse en casa. CUIDADOS EN EL HOGAR   No tome medicamentos que lo ayuden a defecar (laxantes), salvo que su mdico se lo indique.  Solo tome los medicamentos que le haya indicado su mdico.  Coma o beba lo que le indique su mdico. Su mdico le dir si debe seguir una dieta especial. SOLICITE AYUDA SI:  No sabe cul es la causa del dolor de Leachestmago.  Tiene dolor de estmago cuando siente ganas de vomitar (nuseas) o tiene colitis (diarrea).  Tiene dolor durante la miccin o la evacuacin.  El dolor de estmago lo despierta de noche.  Tiene dolor de Mirantestmago que empeora o [place] cuando come.  Tiene dolor de Mirantestmago que empeora cuando come CIGNAalimentos grasosos.  Tiene fiebre. SOLICITE AYUDA DE INMEDIATO SI:   El dolor no desaparece en un plazo mximo de 2horas.  No deja de (vomitar).  El dolor cambia y se Librarian, academiclocaliza solo en la parte derecha o izquierda del Selmaestmago.  La materia fecal es sanguinolenta o de aspecto alquitranado. ASEGRESE DE QUE:   Comprende estas instrucciones.  Controlar su afeccin.  Recibir ayuda de inmediato si no mejora o si empeora. Document Released: 04/04/2008 Document Revised: 01/11/2013 [facility] Patient Information 2015 [place], [place]. This information is not intended to replace advice given to you by your health care provider. Make sure you discuss any questions you have with your health care provider.  Vaginosis bacteriana (Bacterial  Vaginosis) La vaginosis bacteriana es una infeccin vaginal que perturba el equilibrio normal de las bacterias que se encuentran en la vagina. Es el resultado de un crecimiento excesivo de ciertas bacterias. Esta es la infeccin vaginal ms frecuente en mujeres en edad reproductiva. El tratamiento es  importante para prevenir complicaciones, especialmente en mujeres embarazadas, dado que puede causar un parto prematuro. CAUSAS  La vaginosis bacteriana se origina por un aumento de bacterias nocivas que, generalmente, estn presentes en cantidades ms pequeas en la vagina. Varios tipos diferentes de bacterias pueden causar esta afeccin. Sin embargo, la causa de su desarrollo no se comprende totalmente. FACTORES DE RIESGO Ciertas actividades o comportamientos pueden exponerlo a un mayor riesgo de desarrollar vaginosis bacteriana, entre los que se incluyen:  Tener una nueva pareja sexual o mltiples parejas sexuales.  Las duchas vaginales  El uso del DIU (dispositivo intrauterino) como mtodo anticonceptivo. El contagio no se produce en baos, por ropas de cama, en piscinas o por contacto con objetos. SIGNOS Y SNTOMAS  Algunas mujeres que padecen vaginosis bacteriana no presentan signos ni sntomas. Los sntomas ms comunes son:  Secrecin vaginal de color grisceo.  Secrecin vaginal con olor similar al [employer], especialmente despus de Sales promotion account executive.  Picazn o sensacin de ardor en la vagina o la vulva.  Ardor o dolor al [employer]. DIAGNSTICO  Su mdico analizar su historia clnica y le examinar la vagina para detectar signos de vaginosis bacteriana. Puede tomarle Lauris Poag de flujo vaginal. Su mdico examinar esta muestra con un microscopio para controlar las bacterias y clulas anormales. Tambin puede realizarse un anlisis del pH vaginal.  TRATAMIENTO  La vaginosis bacteriana puede tratarse con antibiticos, en forma de comprimidos o de crema vaginal. Puede indicarse una segunda tanda de antibiticos si la afeccin se repite despus del tratamiento.  INSTRUCCIONES PARA EL CUIDADO EN EL HOGAR   Tome solo medicamentos de venta libre o recetados, segn las indicaciones del mdico.  Si le han recetado antibiticos, tmelos como se le indic. Asegrese de que  finaliza la prescripcin completa aunque se sienta mejor.  No mantenga relaciones sexuales Librarian, academic.  Comunique a sus compaeros sexuales que sufre una infeccin vaginal. Deben consultar a su mdico y recibir tratamiento si tienen problemas, como picazn o una erupcin cutnea leve.  Practique el sexo seguro usando preservativos y tenga un nico compaero sexual. SOLICITE ATENCIN MDICA SI:   Sus sntomas no mejoran despus de 3 das de [place].  Aumenta la secrecin o Chief Technology Officer.  Tiene fiebre. ASEGRESE DE QUE:   Comprende estas instrucciones.  Controlar su afeccin.  Recibir ayuda de inmediato si no mejora o si empeora. PARA OBTENER MS INFORMACIN  Centros para el control y la prevencin de Child psychotherapist for Disease Control and Prevention, CDC): SolutionApps.co.za Asociacin Estadounidense de la Salud Sexual (American Sexual Health Association, SHA): www.ashastd.org  Document Released: 04/15/2007 Document Revised: 10/27/2012 [facility] Patient Information 2015 [place], [place]. This information is not intended to replace advice given to you by your health care provider. Make sure you discuss any questions you have with your health care provider.  Dolor plvico  (Pelvic Pain)  El dolor plvico se siente debajo del ombligo y a nivel de las caderas. Puede tener diferentes causas. Es importante recibir asistencia inmediatamente. Especialmente en los casos de dolor agudo, intenso e inusual que aparece de [place] sbita.  CUIDADOS EN EL HOGAR   Slo tome los medicamentos segn le indique el mdico.  Haga reposo tal [employer]  indic el mdico.  Consuma una dieta rica en frutas, vegetales y carnes magras.  Beba gran cantidad de lquido para mantener el pis (orina) de tono claro o amarillo plido.  Evite las relaciones sexuales, Counsellor.  Aplique compresas calientes o fras en la zona baja del vientre (abdomen). Aplique la compresa que Mining engineer.  Evite las situaciones que le causen estrs.  Lleve un registro Programmer, systems. Marcelino Freestone:  Cundo comenz el dolor.  Dnde se localiza.  Las cosas que parecen relacionarse con el dolor, como las comidas o el perodo menstrual.  Concurra a las consultas de control con el mdico, segn las indicaciones. SOLICITE AYUDA DE INMEDIATO SI:   Tiene una hemorragia por la vagina.  Siente ms dolor plvico.  Se siente dbil, tiene mareos o se desvanece (se desmaya).  Siente escalofros.  Siente dolor al orinar u observa sangre en la orina.  La materia fecal es lquida (diarrea).  No puede detener los vmitos.  Tiene fiebre o sntomas que persisten durante ms de [address]  [address].  Tiene fiebre y los sntomas [address].  Ha sido abusada fsica o sexualmente.  Los medicamentos no Editor, commissioning.  Observa lquido (secrecin) por la vagina o un lquido que no es normal. ASEGRESE DE QUE:   Comprende estas instrucciones.  Controlar su enfermedad.  Solicitar ayuda de inmediato si no mejora o si empeora. Document Released: 07/08/2011 [facility] Patient Information 2015 [place], [place]. This information is not intended to replace advice given to you by your health care provider. Make sure you discuss any questions you have with your health care provider.

## 2014-03-21 LAB — HIV ANTIBODY (ROUTINE TESTING W REFLEX): HIV SCREEN 4TH GENERATION: NONREACTIVE

## 2014-03-21 LAB — RPR: RPR: NONREACTIVE

## 2014-03-22 LAB — GC/CHLAMYDIA PROBE AMP (~~LOC~~) NOT AT ARMC
Chlamydia: NEGATIVE
Neisseria Gonorrhea: NEGATIVE

## 2014-04-02 ENCOUNTER — Encounter: Payer: Self-pay | Admitting: Licensed Clinical Social Worker

## 2014-05-02 ENCOUNTER — Ambulatory Visit (INDEPENDENT_AMBULATORY_CARE_PROVIDER_SITE_OTHER): Payer: Self-pay | Admitting: Pediatrics

## 2014-05-02 ENCOUNTER — Encounter: Payer: Self-pay | Admitting: Pediatrics

## 2014-05-02 ENCOUNTER — Encounter (INDEPENDENT_AMBULATORY_CARE_PROVIDER_SITE_OTHER): Payer: Self-pay

## 2014-05-02 ENCOUNTER — Ambulatory Visit
Admission: RE | Admit: 2014-05-02 | Discharge: 2014-05-02 | Disposition: A | Discharge status: Home / Self Care | Payer: Medicaid Other | Source: Ambulatory Visit | Attending: Pediatrics | Admitting: Pediatrics

## 2014-05-02 VITALS — BP 100/70 | HR 70 | Ht 64.0 in | Wt 146.6 lb

## 2014-05-02 DIAGNOSIS — R109 Unspecified abdominal pain: Secondary | ICD-10-CM

## 2014-05-02 DIAGNOSIS — G8929 Other chronic pain: Secondary | ICD-10-CM

## 2014-05-02 DIAGNOSIS — M791 Myalgia: Secondary | ICD-10-CM

## 2014-05-02 DIAGNOSIS — M7918 Myalgia, other site: Secondary | ICD-10-CM

## 2014-05-02 DIAGNOSIS — K5901 Slow transit constipation: Secondary | ICD-10-CM

## 2014-05-02 DIAGNOSIS — Z113 Encounter for screening for infections with a predominantly sexual mode of transmission: Secondary | ICD-10-CM

## 2014-05-02 MED ORDER — DOCUSATE SODIUM 100 MG PO CAPS: 100.0000 mg | ORAL_CAPSULE | Freq: Two times a day (BID) | ORAL | Status: DC

## 2014-05-02 NOTE — Progress Notes (Signed)
Adolescent Medicine Consultation Initial Visit Jamie Lutz  is a 19 y.o. female referred by Dillon Bjork, MD here today for evaluation of chronic lower abdominal pain.      PCP Confirmed?  yes  Previsit planning completed:  yes  Growth Chart Viewed? not applicable   History was provided by the patient.  HPI:    Jamie Lutz is a 19 year old female with PMH of anxiety, "panic attacks," and extremity paresthesias who presents for evaluation of chronic LLQ abdominal pain.  She was last seen for pain in the ED on 03/20/14 for acute worsening of the pain.  At that time, pelvic ultrasound was normal.  Wet prep demonstrated few clue cells, therefore she was treated with metronidazole for BV.  CBC, CMP, lipase, UA were unremarkable.   GC/chlamydia, HIV and RPR each negative.  She has also been seen in our clinic on 02/06/14 for multiple complaints, and at that time had normal UA, CBC, BMP, TSH, ferritin, and ESR.   Complains of constant, dull LLQ pain that began after having first child in 2013, approximately 3 years ago.  The pain worsened after having second child in May 2014, approximately 2 years ago.  Daily, has "pinch" or crampy sensation, which lasts for approximately 5 minutes.  The pain sometimes radiates down her left leg.  Endorses urinary frequency also.  The pain is sometimes associated with menses, and sometimes alleviated by stools.  States she became constipated approximately 1 month ago. States she has daily stool.  No change in character of stool, and sometimes has to strain to stool.  No change in color of stool - no melena or hematochezia.  Endorses feeling "bloated" after eating, also recent in onset, for the past month.  Unable to state whether the pain is worse at any time of day.  Takes ibuprofen approximately once per week, when the pain reaches 8/10.  The ibuprofen does alleviate the pain.  Has cut out caffeine, but still has urinary frequency (voids ~every 30 minutes to 1 hour).   No pain with urination.    No nausea, vomiting, diarrhea, fever, or rash - endorses appetite change and weight gain since second child.  No association of the pain with eating.  No history of STI.  Last sexually active 1 month ago; always uses condoms.  Normal vaginal discharge.  States that she has dyspareunia with onset of sex, which began after having her second child.    Endorses headache, unsure of frequency, but states vision is blurry.  Seen by eye doctor who prescribed glasses, which has helped.  Wears glasses only when driving.  States she sometimes feels depressed (most days).  Sometimes feels passive suicidality.  No anhedonia; sometimes feelings of guilt; some days has loss of energy; has decrease in concentration; no psychomotor symptoms; has difficulty falling asleep.  No FH of IBD or celiac disease.  Is currently in relationship with sons' father, who lives with his parents.  She lives with her parents.    Patient wishes to defer contraception at today's visit.  Patient's last menstrual period was 04/21/2014.  ROS   The following portions of the patient's history were reviewed and updated as appropriate: allergies, current medications, past family history, past medical history, past social history, past surgical history and problem list.  No Known Allergies  Past Medical History:  Reviewed and updated?  no Past Medical History  Diagnosis Date  . No pertinent past medical history     Family History: Reviewed and updated?  yes Family History  Problem Relation Age of Onset  . Diabetes Father     Social History: Lives with mother and her two sons.  Is in a sexual relationship with her sons' father, who lives locally with his parents.    Physical Exam:  Filed Vitals:   05/02/14 1416  BP: 100/70  Pulse: 70  Height: '5\' 4"'  (1.626 m)  Weight: 146 lb 9.6 oz (66.497 kg)   BP 100/70 mmHg  Pulse 70  Ht '5\' 4"'  (1.626 m)  Wt 146 lb 9.6 oz (66.497 kg)  BMI 25.15 kg/m2  LMP  04/21/2014 Body mass index: body mass index is 25.15 kg/(m^2). Facility age limit for growth percentiles is 20 years.  Physical Exam  Constitutional: She is oriented to person, place, and time. She appears well-developed and well-nourished.  HENT:  Head: Normocephalic and atraumatic.  Mouth/Throat: Oropharynx is clear and moist. No oropharyngeal exudate.  Eyes: Conjunctivae and EOM are normal. Pupils are equal, round, and reactive to light.  Neck: Normal range of motion. Neck supple.  Cardiovascular: Normal rate, regular rhythm and normal heart sounds.  Exam reveals no friction rub.   No murmur heard. Pulmonary/Chest: Effort normal and breath sounds normal. No respiratory distress.  Abdominal: Soft. Bowel sounds are normal. She exhibits no distension and no mass. There is no hepatosplenomegaly. There is tenderness in the right upper quadrant and left lower quadrant. There is no rigidity, no rebound and no guarding. No hernia.  LLQ tenderness; mild RUQ tenderness; tenderness with palpation of midline abdomen.   Genitourinary: Pelvic exam was performed with patient supine.  Visible well-healed scar in vaginal vault, c/w history of perineal laceration during childbirth.  Tenderness of pelvic floor musculature, likely iliopsoas vs pyriformis, as well as levator muscle.  No CMT or tenderness of adnexa otherwise.  Normal external GU exam.   Neurological: She is alert and oriented to person, place, and time.  Skin: Skin is warm and dry. No rash noted.  Psychiatric: Her behavior is normal.  Nursing note and vitals reviewed.  PHQ Completed on: 05/02/14 Somatic Disorder: 10 PHQ-9:  17 Panic Disorder: yes, had had panic attacks in past GAD-7:  8 Eating Disorder: no, some binge-eating potentially (no purging) Alcohol Abuse: no Reported problems make it somewhat difficult to complete activities of daily functioning.   Assessment/Plan: Jamie Lutz is a 19-yo pleasant young lady pleasant young lady who presents with 3-year  history of chronic LLQ abdominal / pelvic pain.  Differential diagnosis includes: musculoskeletal (e.g., abdominal wall myofascial syndrome), GI (e.g., constipation), gynecological (e.g., endometriosis, PID), and psychological (e.g., anxiety, IBS) in nature.  Abdominal wall myofascial syndrome diagnosis would be supported by the following factors: onset after vaginal childbirth, radiation of the pain to the thigh, increased abdominal pain with flexion of neck while lying supine, urinary symptoms, and focal pain on exam that supports an MSK etiology.  X-ray obtained during today's clinic visit demonstrates mild to moderate constipation, which we would like to alleviate.   1. For constipation on abdominal XR: Start colace 100 mg BID 2. Will place referral to physical therapy.  We will discuss with our referrals coordinator and contact patient for further information about this referral.  3. Patient deferred contraception at today's visit, despite counseling and encouragement to consider various contraceptive options.  Provided contraception handout.   Follow-up:   Return in about 6 weeks (around 06/13/2014) for abdominal pain , with Dr. Henrene Pastor only.   Medical decision-making:  > 40 minutes spent, more than 50% of appointment  was spent discussing diagnosis and management of symptoms

## 2014-05-02 NOTE — Patient Instructions (Signed)
Start taking the Colace (docusate sodium) 1 pill twice daily.  We will call you for a referral for the pain you are having.  The pain in your belly is caused by your muscles in your belly.  You need some physical therapy to help those muscles get better and that will help get rid of the pain.

## 2014-05-02 NOTE — Progress Notes (Signed)
Attending Co-Signature.  I saw and evaluated the patient, performing the key elements of the service.  I developed the management plan that is described in the resident's note, and I agree with the content.  19 yo female with h/o anxiety, panic attacks and paresthesias presents for LLQ pain, constant and dull.  Has cramping or pinching sensation in LLQ daily, unclear timing or trigger.  Occasionally radiates down her left leg.  Sometimes associated with menses and/or with constipation.  Began after her 1st child, worsened after her 2nd child.  Pain is sometimes relieved by ibuprofen.  Does report dyspareunia at times.  Had depoprovera x 1 3 months ago, but declined birth control today.  After careful review of her history, work-up and physical exam, most likely etiology of her pain is myofascial abdominal wall pain.  Most beneficial is PT.  Will need to investigate possible referral sources given her lack of insurance.  We also discussed treating her constipation as this may help decrease her pain as well.  Pt also reported significant depressive symptoms and her PHQ was positive.  Pt denied suicidality.  Pt agreed to discuss more at a future visit.  Cain SievePERRY, MARTHA FAIRBANKS, MD Adolescent Medicine Specialist

## 2014-05-03 DIAGNOSIS — G8929 Other chronic pain: Secondary | ICD-10-CM | POA: Insufficient documentation

## 2014-05-03 DIAGNOSIS — M7918 Myalgia, other site: Secondary | ICD-10-CM | POA: Insufficient documentation

## 2014-05-03 DIAGNOSIS — K5900 Constipation, unspecified: Secondary | ICD-10-CM | POA: Insufficient documentation

## 2014-05-03 DIAGNOSIS — R109 Unspecified abdominal pain: Principal | ICD-10-CM

## 2014-05-03 LAB — GC/CHLAMYDIA PROBE AMP, URINE
Chlamydia, Swab/Urine, PCR: NEGATIVE
GC Probe Amp, Urine: NEGATIVE

## 2014-06-13 ENCOUNTER — Ambulatory Visit: Payer: Self-pay | Admitting: Pediatrics

## 2014-06-29 ENCOUNTER — Ambulatory Visit: Payer: No Typology Code available for payment source

## 2014-06-30 ENCOUNTER — Ambulatory Visit: Payer: No Typology Code available for payment source

## 2014-07-04 ENCOUNTER — Ambulatory Visit: Payer: No Typology Code available for payment source | Admitting: Physical Therapy

## 2014-07-07 ENCOUNTER — Ambulatory Visit: Payer: Self-pay

## 2014-07-10 ENCOUNTER — Ambulatory Visit: Payer: No Typology Code available for payment source | Attending: Pediatrics | Admitting: Physical Therapy

## 2014-07-10 DIAGNOSIS — R1031 Right lower quadrant pain: Secondary | ICD-10-CM | POA: Insufficient documentation

## 2014-07-10 DIAGNOSIS — M25552 Pain in left hip: Secondary | ICD-10-CM | POA: Insufficient documentation

## 2014-07-10 DIAGNOSIS — R1032 Left lower quadrant pain: Secondary | ICD-10-CM | POA: Insufficient documentation

## 2014-07-10 DIAGNOSIS — G729 Myopathy, unspecified: Secondary | ICD-10-CM | POA: Insufficient documentation

## 2014-07-10 DIAGNOSIS — M6289 Other specified disorders of muscle: Secondary | ICD-10-CM

## 2014-07-11 NOTE — Therapy (Signed)
[facility] [address] [place], [place], [address] Phone: [phone]   Fax:  [phone]  Physical Therapy Evaluation  Patient Details  Name: Jamie Lutz MRN: [id number] Date of Birth: 10/26/1995 Referring Provider:  Jonetta Osgood, MD  Encounter Date: 07/10/2014      PT End of Session - 07/10/14 2032    Visit Number 1   Number of Visits 12   Date for PT Re-Evaluation 08/21/14   PT Start Time 1335   PT Stop Time 1425   PT Time Calculation (min) 50 min   Activity Tolerance Patient tolerated treatment well   Behavior During Therapy [facility] for tasks assessed/performed      Past Medical History  Diagnosis Date  . No pertinent past medical history     Past Surgical History  Procedure Laterality Date  . No past surgeries    . Perineal laceration repair  04/17/2011    Procedure: SUTURE REPAIR PERINEAL LACERATION;  Surgeon: Tilda Burrow, MD;  Location: [facility];  Service: Gynecology;  Laterality: N/A;    There were no vitals filed for this visit.  Visit Diagnosis:  Abdominal pain, bilateral lower quadrant  Pain in the hip, left  Muscle tightness      Subjective Assessment - 07/10/14 1526    Subjective Patient states after childbirth she began to have pain in L side ()  Pain began after the delivery of 1 st baby (conflict with previous MD note)   Constant pain in L lower abdominal area. She also has Frequent urination, has chronic constipation, urgency and does report x 1  loss of contorl of bladder.  She has been dealing with this for over 2 yrs without any relief.     Patient is accompained by: Family member   Pertinent History pregnancies 1st 04/16/11 and 2nd 06/11/12   Limitations Lifting;Sitting   How long can you sit comfortably? usually uncomfortable   How long can you stand comfortably? does not affect   How long can you walk comfortably? does not affect in fact may relieve to a degree   Diagnostic tests  XR, [place] without (neg)   Patient Stated Goals would like to have less discomfort, anxiety and not have to go to the bathroom all day (every 30 min)   Currently in Pain? 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 Marina Goodell this week   Precautions   Precautions None   Restrictions   Weight Bearing Restrictions No   Balance Screen   Has the patient fallen in the past 6 months No  "Clumsy"   Home Environment   Living Environment Private residence   Living Arrangements Children;Parent;Other (Comment)  father is involved with kids, does not live with them  Prior Function   Level of Independence Independent   Cognition   Overall Cognitive Status Within Functional Limits for tasks assessed   Observation/Other Assessments   Focus on Therapeutic Outcomes (FOTO)  NT   Sensation   Light Touch Appears Intact   Posture/Postural Control   Posture/Postural Control No significant limitations   AROM   Right/Left Hip --  L hip "pop" with ER/IR PROM   Right Hip Extension 16   Right Hip Flexion 80  90    Left Hip Extension 9   Left Hip Flexion 85  94   Lumbar Flexion WNL   Lumbar Extension stretch L abdominals   Lumbar - Right Side Bend WNL   Lumbar - Left Side Bend WNL   Lumbar - Right Rotation WNL   Lumbar - Left Rotation WNL   Strength   Right Hip Flexion 4/5   Right Hip  Extension 4/5   Right Hip ABduction 4/5   Left Hip Flexion 4/5   Left Hip Extension 4/5   Left Hip ABduction 4/5  pain   Flexibility   Soft Tissue Assessment /Muscle Length yes   Hamstrings mild tightness   Quadriceps WNL   ITB tight   Piriformis not tested   Palpation   Palpation comment pain L psoas, pectineus. Sore in rectus femoris origin and prox m belly.  Sore laterally (ITB and TFL)  able to slide heel up without pain when in supine          [facility] Adult PT Treatment/Exercise - 07/10/14 1349    Knee/Hip Exercises: Stretches   Hip Flexor Stretch (p) Both;3 reps   Hip Flexor Stretch Limitations (p) edge of high mat (much more restricted on L side, hip lacks apprx 5-10 deg from neutral)   ITB Stretch (p) Left;1 rep;60 seconds   ITB Stretch Limitations (p) QL position   Moist Heat Therapy   Number Minutes Moist Heat (p) 10 Minutes   Moist Heat Location (p) Hip  L lateral in sidelying            PT Education - 07/10/14 2031    Education provided Yes   Education Details PT/POC, pelvic pain specialist, anatomy of hip    Person(s) Educated Patient;Parent(s)   Methods Explanation;Demonstration;Handout   Comprehension Verbalized understanding;Returned demonstration;Need further instruction          PT Short Term Goals - 07/11/14 1610    PT SHORT TERM GOAL #1   Title Patient will be i with initial HEP   Time 4   Period Weeks   Status New   PT SHORT TERM GOAL #2   Title Pt will be able to sit 25% more comfortably for meals, riding in car   Time 4   Period Weeks   Status New   PT SHORT TERM GOAL #3   Title Pt will be able to lift, bathe kids with min pain in LLQ   Time 4   Period Weeks   Status New   PT SHORT TERM GOAL #4   Title Pt will be i with concepts of RICE, stretching, posture   Time 4   Period Weeks   Status New           PT Long Term Goals - 07/11/14 0620    PT LONG TERM GOAL #1   Title Pt will be I with more advanced HEP, self care    Time 8   Period Weeks   Status New   PT LONG  TERM GOAL #2   Title Pt will be able to report pain <2/10 with ADLs and mobility    Time 8   Period Weeks   Status New   PT LONG TERM GOAL #3   Title Pt will be able to sit as needed without pain in LLQ   Time 8   Period Weeks   Status New   PT LONG TERM GOAL #4   Title additional LTG to be set by Eulis Foster               Plan - 07/11/14 0612    Clinical Impression Statement This young lady has symptoms consistent with L ant.lateral hip inflammation, tightness.  She does also have other symptoms which may be better addressed with a pelvic pain specialist who can also address urinary urgency, frequency and shed some light on the cause ofher ongoing  symptoms. Understandably she is anxious about the cause of her pain.  Plan to transition her care to Eulis Foster when able to schedule with her.     Pt will benefit from skilled therapeutic intervention in order to improve on the following deficits Decreased range of motion;Increased fascial restricitons;Dizziness;Decreased activity tolerance;Pain;Impaired flexibility;Decreased balance;Decreased mobility;Decreased strength   Rehab Potential Good   PT Frequency 2x / week   PT Duration 8 weeks   PT Treatment/Interventions ADLs/Self Care Home Management;Ultrasound;Neuromuscular re-education;Passive range of motion;Functional mobility training;Iontophoresis 4mg /ml Dexamethasone;Moist Heat;Therapeutic exercise;Manual techniques;Therapeutic activities;Cryotherapy;Electrical Stimulation;Dry needling;Balance training   PT Next Visit Plan review hip stretches, manual to ant/lat hip and modalities   PT Home Exercise Plan psoas and L lateral trunk/hip stretch   Recommended Other Services pelvic pain   Consulted and Agree with Plan of Care Patient;Family member/caregiver   Family Member Consulted Mother         Problem List Patient Active Problem List   Diagnosis Date Noted  . Myofascial  abdominal wall pain 05/03/2014  . Chronic abdominal pain 05/03/2014  . Constipation 05/03/2014  . Paresthesia 02/06/2014  . Teenage mother 02/03/2014    Kinza Gouveia 07/11/2014, 6:26 AM  [facility] [address] [place], [place], [address] Phone: [phone]   Fax:  [phone]  Karie Mainland, PT 07/11/2014 6:26 AM Phone: [phone] Fax: [phone]

## 2014-07-12 ENCOUNTER — Ambulatory Visit: Payer: Self-pay | Admitting: Pediatrics

## 2014-07-18 ENCOUNTER — Encounter: Payer: Self-pay | Admitting: Physical Therapy

## 2014-07-18 ENCOUNTER — Ambulatory Visit: Payer: No Typology Code available for payment source | Attending: Pediatrics | Admitting: Physical Therapy

## 2014-07-18 DIAGNOSIS — N949 Unspecified condition associated with female genital organs and menstrual cycle: Secondary | ICD-10-CM

## 2014-07-18 DIAGNOSIS — M6289 Other specified disorders of muscle: Secondary | ICD-10-CM

## 2014-07-18 DIAGNOSIS — M25552 Pain in left hip: Secondary | ICD-10-CM | POA: Insufficient documentation

## 2014-07-18 DIAGNOSIS — R1032 Left lower quadrant pain: Secondary | ICD-10-CM | POA: Insufficient documentation

## 2014-07-18 DIAGNOSIS — G729 Myopathy, unspecified: Secondary | ICD-10-CM | POA: Insufficient documentation

## 2014-07-18 DIAGNOSIS — R102 Pelvic and perineal pain: Secondary | ICD-10-CM | POA: Insufficient documentation

## 2014-07-18 DIAGNOSIS — R1031 Right lower quadrant pain: Secondary | ICD-10-CM

## 2014-07-18 NOTE — Patient Instructions (Signed)
Diastasis Recti Correction With Towel (Hook-Lying)   Loop long towel or sheet under back and criss-cross over lower abdomen. Inhale. In one fluid movement: Pull in navel, pull towel tight across abdomen, exhale, raise head toward chest, and Hold for _5__ seconds. Return, rest for 5___ seconds. Repeat _10__ times. Do _3__ times a day.   Copyright  VHI. All rights reserved.  [facility] [address], [address] [place], [place] [address] Phone # [phone] Fax [phone]

## 2014-07-18 NOTE — Therapy (Signed)
[facility] [address] [place], [place], [address] Phone: [phone]   Fax:  [phone]  Physical Therapy Treatment  Patient Details  Name: Jamie Lutz MRN: [id number] Date of Birth: 01/26/1995 Referring Provider:  Gaspar Skeeters, MD  Encounter Date: 07/18/2014      PT End of Session - 07/18/14 1447    Visit Number 2   Number of Visits 12   Date for PT Re-Evaluation 08/21/14   PT Start Time 1400   PT Stop Time 1445   PT Time Calculation (min) 45 min   Activity Tolerance Patient tolerated treatment well   Behavior During Therapy [facility] for tasks assessed/performed      Past Medical History  Diagnosis Date  . No pertinent past medical history     Past Surgical History  Procedure Laterality Date  . No past surgeries    . Perineal laceration repair  04/17/2011    Procedure: SUTURE REPAIR PERINEAL LACERATION;  Surgeon: Jonnie Kind, MD;  Location: [place];  Service: Gynecology;  Laterality: N/A;    There were no vitals filed for this visit.  Visit Diagnosis:  Abdominal pain, bilateral lower quadrant - Plan: PT plan of care cert/re-cert  Pain in the hip, left - Plan: PT plan of care cert/re-cert  Muscle tightness - Plan: PT plan of care cert/re-cert  Perineal pain in female - Plan: PT plan of care cert/re-cert      Subjective Assessment - 07/18/14 1406    Subjective Patient had one child on 04/16/2011 and second child is 06/11/2012 and been having symptoms since then. 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 Pain with intercourse with initial penetration and deeper penetration. Patient has difficulty with constipation. Vaginal exams are painful.    Patient is accompained by: Family member   Pertinent History pregnancies 1st 04/16/11 and 2nd 06/11/12   Limitations Lifting;Sitting   How long can you sit comfortably? usually uncomfortable   How long can you stand comfortably?  does not affect   How long can you walk comfortably? does not affect in fact may relieve to a degree   Diagnostic tests XR, [place] without (neg)   Patient Stated Goals would like to have less discomfort, anxiety and not have to go to the bathroom all day (every 30 min)   Currently in Pain? Yes   Pain Score 5    Pain Location Abdomen   Pain Orientation Left;Lower   Pain Descriptors / Indicators Aching;Tender;Cramping;Sore   Pain Type Chronic pain   Pain Onset More than a month ago   Pain Frequency Constant   Aggravating Factors  sitting, laying prone   Pain Relieving Factors nothing helps   Effect of Pain on Daily Activities cannot lift or carry kids without pain            [facility] PT Assessment - 07/18/14 0001    Palpation   SI assessment  left ilium is posteriorly rotated   Special Tests    Special Tests Sacrolliac Tests   Sacroiliac Tests  Pelvic Distraction   Pelvic Dictraction   Findings Positive   Side  Left   Comment pain                  Pelvic Floor Special Questions - 07/18/14 0001    Prior Pregnancies Yes   Number of Pregnancies 2   Number of Vaginal Deliveries 2   Episiotomy Performed Yes  Diastasis Recti 2 fingers width above and below the umbilicus   Marinoff Scale pain interrupts completion   Urinary urgency Yes   Perineal Body/Introitus  Elevated   External Palpation left ischiocavernosus and bulbocavernosus   Pelvic Floor Internal Exam Patient confirms identification and approves physical therapist to perform pelvic floor muscle assessment and integrity   Exam Type Vaginal   Palpation left obturator internist, levator ani, and bil. sides of urethra   Strength weak squeeze, no lift           [facility] Adult PT Treatment/Exercise - 07/18/14 0001    Manual Therapy   Manual Therapy Muscle Energy Technique;Soft tissue mobilization;Internal Pelvic Floor   Soft tissue mobilization left psoas   Internal Pelvic Floor left pelvic floor muscles   Muscle  Energy Technique correct posteriorly rotated left ilium                PT Education - 07/18/14 1445    Education provided Yes   Education Details abdominal contraction with assistance   Person(s) Educated Patient   Methods Explanation;Demonstration;Tactile cues;Verbal cues;Handout   Comprehension Returned demonstration;Verbalized understanding          PT Short Term Goals - 07/18/14 1459    PT SHORT TERM GOAL #1   Title Patient will be i with initial HEP   Time 4   Period Weeks   Status New   PT SHORT TERM GOAL #2   Title Pt will be able to sit 25% more comfortably for meals, riding in car   Time 4   Period Weeks   Status New   PT SHORT TERM GOAL #3   Title Pt will be able to lift, bathe kids with min pain in LLQ   Time 4   Period Weeks   Status New   PT SHORT TERM GOAL #4   Title Pt will be i with concepts of RICE, stretching, posture   Time 4   Period Weeks   Status New           PT Long Term Goals - 07/18/14 1453    PT LONG TERM GOAL #1   Title Pt will be I with more advanced HEP, self care   Time 8   Period Weeks   Status New   PT LONG TERM GOAL #2   Title Pt will be able to report pain <2/10 with ADLs and mobility    Time 8   Period Weeks   Status New   PT LONG TERM GOAL #3   Title Pt will be able to sit as needed without pain in LLQ   Time 8   Period Weeks   Status New   PT LONG TERM GOAL #4   Title pain with intercourse decreased >/= 75%   Time 8   Period Weeks   Status New   PT LONG TERM GOAL #5   Title abdominal strength 4/5 with reduction of diastasis Recti to 1 finger width to assist in reduction in constipation and decreased urgency with urine   Time 8   Period Weeks   Status New               Plan - 07/18/14 1447    Clinical Impression Statement Patient is a 19 year old female with 2 finger width diastasis rectie above and below umbilicus. Patient has pain with intercourse.  Left ilium is posteriorly rotated.  Pelvic  floor strength is 2/5 due to being hypertonic.  Palpable tenderness  located in left psoas, bil. sides of urethra, left obturator internist and levator ani. Patient has tenderness located round the introitus especially posteriorly.  After muscle energy technique unable to correct patient pelvis and she understands to get a corset to assist in abdominal support.  Patient has difficulty with constipation and urgency. Patient would benefit from physical therapy to improve core strength, bilateral  hip strength, decrease pelvic floor tone, reduce muscular pain. Patient has not met goals due to just starting therapy. New goals were added to address the pelvic floor.    Pt will benefit from skilled therapeutic intervention in order to improve on the following deficits Decreased range of motion;Increased fascial restricitons;Dizziness;Decreased activity tolerance;Pain;Impaired flexibility;Decreased balance;Decreased mobility;Decreased strength;Increased muscle spasms   Rehab Potential Good   PT Frequency 2x / week   PT Duration 8 weeks   PT Treatment/Interventions ADLs/Self Care Home Management;Ultrasound;Neuromuscular re-education;Passive range of motion;Functional mobility training;Iontophoresis 57m/ml Dexamethasone;Moist Heat;Therapeutic exercise;Manual techniques;Therapeutic activities;Cryotherapy;Electrical Stimulation;Dry needling;Balance training;Biofeedback;Patient/family education   PT Next Visit Plan soft tissue work, abdominal massage for constipation, toileting technique, abdominal bracing   PT Home Exercise Plan toileting technique, abdominal bracing   Consulted and Agree with Plan of Care Patient   Family Member Consulted --        Problem List Patient Active Problem List   Diagnosis Date Noted  . 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 Chronic abdominal pain 05/03/2014  . Constipation 05/03/2014  . Paresthesia 02/06/2014  . Teenage mother 02/03/2014    Jazmaine Fuelling,PT 07/18/2014,  3:07 PM  [facility] [address] [place] [address]Phone: [phone]  Fax:  [phone]

## 2014-08-01 ENCOUNTER — Encounter: Payer: Self-pay | Admitting: Physical Therapy

## 2014-08-01 ENCOUNTER — Ambulatory Visit: Payer: No Typology Code available for payment source | Attending: Pediatrics | Admitting: Physical Therapy

## 2014-08-01 DIAGNOSIS — R1032 Left lower quadrant pain: Secondary | ICD-10-CM | POA: Insufficient documentation

## 2014-08-01 DIAGNOSIS — N949 Unspecified condition associated with female genital organs and menstrual cycle: Secondary | ICD-10-CM | POA: Insufficient documentation

## 2014-08-01 DIAGNOSIS — M6289 Other specified disorders of muscle: Secondary | ICD-10-CM

## 2014-08-01 DIAGNOSIS — G729 Myopathy, unspecified: Secondary | ICD-10-CM | POA: Insufficient documentation

## 2014-08-01 DIAGNOSIS — R1031 Right lower quadrant pain: Secondary | ICD-10-CM

## 2014-08-01 DIAGNOSIS — M25552 Pain in left hip: Secondary | ICD-10-CM

## 2014-08-01 DIAGNOSIS — R102 Pelvic and perineal pain: Secondary | ICD-10-CM

## 2014-08-01 NOTE — Therapy (Signed)
[facility] [address], [place], [place], [address] Phone: [phone]   Fax:  [phone]  Physical Therapy Treatment  Patient Details  Name: Jamie Lutz MRN: [id number] Date of Birth: 1995-06-09 Referring Provider:  Gaspar Skeeters, MD  Encounter Date: 08/01/2014      PT End of Session - 08/01/14 1107    Visit Number 3   Number of Visits 12   Date for PT Re-Evaluation 08/21/14   PT Start Time 1100   PT Stop Time 1145   PT Time Calculation (min) 45 min   Activity Tolerance Patient tolerated treatment well   Behavior During Therapy [facility] for tasks assessed/performed      Past Medical History  Diagnosis Date  . No pertinent past medical history     Past Surgical History  Procedure Laterality Date  . No past surgeries    . Perineal laceration repair  04/17/2011    Procedure: SUTURE REPAIR PERINEAL LACERATION;  Surgeon: Jonnie Kind, MD;  Location: [facility] [facility];  Service: Gynecology;  Laterality: N/A;    There were no vitals filed for this visit.  Visit Diagnosis:  Abdominal pain, bilateral lower quadrant  Pain in the hip, left  Muscle tightness  Perineal pain in female      Subjective Assessment - 08/01/14 1103    Subjective I felt better for 1 week after treatment. I got the corset and it is helping. It helps the pain a little. I have been doing my exercises.  I have been going for runs and lateral left leg hurts.    Pertinent History pregnancies 1st 04/16/11 and 2nd 06/11/12   Limitations Lifting;Sitting   How long can you sit comfortably? usually uncomfortable   How long can you stand comfortably? does not affect   How long can you walk comfortably? does not affect in fact may relieve to a degree   Diagnostic tests XR, [place] without (neg)   Patient Stated Goals would like to have less discomfort, anxiety and not have to go to the bathroom all day (every 30 min)   Currently in Pain? Yes   Pain  Score 4    Pain Location Abdomen   Pain Orientation Left   Pain Descriptors / Indicators Dull;Sharp   Pain Type Chronic pain   Pain Radiating Towards left leg   Pain Onset More than a month ago   Pain Frequency Constant   Aggravating Factors  sitting , laying on stomach   Pain Relieving Factors corset    Effect of Pain on Daily Activities cannot lift or carry kids   Multiple Pain Sites No            [facility] PT Assessment - 08/01/14 0001    Palpation   SI assessment  left ilium is posteriorly rotated                  Pelvic Floor Special Questions - 08/01/14 0001    Pelvic Floor Internal Exam Patient confirms identification and approves physical therapist to perform pelvic floor muscle assessment and integrity   Exam Type Vaginal   Palpation left obturator internist, levator ani, and bil. sides of urethra           [facility] Adult PT Treatment/Exercise - 08/01/14 0001    Manual Therapy   Manual Therapy Soft tissue mobilization;Muscle Energy Technique   Soft tissue mobilization bil. obturator internist with hip distraction and rotation, levator ani with diaphragmatic breathing, piriformis initernally  Muscle Energy Technique corrected left ilium                PT Education - 08/01/14 1140    Education provided Yes   Education Details diaphragmatic breathing, pelvic floor soft tissue work, ilium muscle energy technique   Person(s) Educated Patient   Methods Explanation;Demonstration;Tactile cues;Verbal cues;Handout   Comprehension Returned demonstration;Verbalized understanding          PT Short Term Goals - 08/01/14 1142    PT SHORT TERM GOAL #1   Title Patient will be i with initial HEP   Time 4   Period Weeks   Status Achieved   PT SHORT TERM GOAL #2   Title Pt will be able to sit 25% more comfortably for meals, riding in car   Time 4   Period Weeks   Status On-going  able to for 1 week   PT SHORT TERM GOAL #3   Title Pt will be able to  lift, bathe kids with min pain in LLQ   Time 4   Period Weeks   Status On-going   PT SHORT TERM GOAL #4   Title Pt will be i with concepts of RICE, stretching, posture   Time 4   Period Weeks   Status On-going           PT Long Term Goals - 07/18/14 1453    PT LONG TERM GOAL #1   Title Pt will be I with more advanced HEP, self care   Time 8   Period Weeks   Status New   PT LONG TERM GOAL #2   Title Pt will be able to report pain <2/10 with ADLs and mobility    Time 8   Period Weeks   Status New   PT LONG TERM GOAL #3   Title Pt will be able to sit as needed without pain in LLQ   Time 8   Period Weeks   Status New   PT LONG TERM GOAL #4   Title pain with intercourse decreased >/= 75%   Time 8   Period Weeks   Status New   PT LONG TERM GOAL #5   Title abdominal strength 4/5 with reduction of diastasis Recti to 1 finger width to assist in reduction in constipation and decreased urgency with urine   Time 8   Period Weeks   Status New               Plan - 08/01/14 1143    Clinical Impression Statement Patient has tenderness located in the introitus, bil. sides of pelvic floor.  Patient has learned how to perform self perineal tissue work to improve the mobility of the muscles.  Patient is wearing the corset to assist in core stability.  Patient left ilium was rotated but after therapy in correct alignment.  Patient contiunes to learn HEP every session. Patient has not met goals due to just starting therapy.  Patient felt good after 1 week of therapy . Patient would benefit from physical therapy to improve strength , reduce pain and improve mobility.    Pt will benefit from skilled therapeutic intervention in order to improve on the following deficits Decreased range of motion;Increased fascial restricitons;Dizziness;Decreased activity tolerance;Pain;Impaired flexibility;Decreased balance;Decreased mobility;Decreased strength;Increased muscle spasms   Rehab Potential  Good   PT Frequency 2x / week   PT Duration 8 weeks   PT Treatment/Interventions ADLs/Self Care Home Management;Ultrasound;Neuromuscular re-education;Passive range of motion;Functional mobility training;Iontophoresis 74m/ml Dexamethasone;Moist Heat;Therapeutic  exercise;Manual techniques;Therapeutic activities;Cryotherapy;Electrical Stimulation;Dry needling;Balance training;Biofeedback;Patient/family education   PT Next Visit Plan core stabilization, assess ilium, stretches of the hips   PT Home Exercise Plan abdominal bracing   Consulted and Agree with Plan of Care Patient        Problem List Patient Active Problem List   Diagnosis Date Noted  . 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 Chronic abdominal pain 05/03/2014  . Constipation 05/03/2014  . Paresthesia 02/06/2014  . Teenage mother 02/03/2014    Natalya Domzalski,PT 08/01/2014, 11:47 AM  [facility] [address] [place], [place], [address] Phone: [phone]   Fax:  [phone]

## 2014-08-01 NOTE — Patient Instructions (Addendum)
Pelvic Rotation: Contract / Relax (Supine)   Hands against left knee, resist bent leg moving toward head. Press straight leg down. Hold __6__ seconds. Relax. Repeat _3___ times per set. Do __1__ sets per session. Do __1__ sessions per day.  http://orth.exer.us/276   Copyright  VHI. All rights reserved.  Diaphragmatic Breathing - Supine   Hands on top of navel, lips closed, breathe in through nose filling stomach with air, navel moves out toward hands. Exhale through puckered lips, hands follow exhalation in. Repeat _5__ times. Rest _5__ seconds between repeats. Do _2__ times per day.  Copyright  VHI. All rights reserved.  STRETCHING THE PELVIC FLOOR MUSCLES NO DILATOR  Supplies . Vaginal lubricant . Mirror (optional) . Gloves (optional) Positioning . Start in a semi-reclined position with your head propped up. Bend your knees and place your thumb or finger at the vaginal opening. Procedure . Apply a moderate amount of lubricant on the outer skin of your vagina, the labia minora.  Apply additional lubricant to your finger. Marland Kitchen. Spread the skin away from the vaginal opening. Place the end of your finger at the opening. . Do a maximum contraction of the pelvic floor muscles. Tighten the vagina and the anus maximally and relax. . When you know they are relaxed, gently and slowly insert your finger into your vagina, directing your finger slightly downward, for 2-3 inches of insertion. . Relax and stretch the 6 o'clock position . Hold each stretch for _2 min__ and repeat __1_ time with rest breaks of _1__ seconds between each stretch. . Repeat the stretching in the 4 o'clock and 8 o'clock positions. . Total time should be _6__ minutes, _1__ x per day.  Note the amount of theme your were able to achieve and your tolerance to your finger in your vagina. . Once you have accomplished the techniques you may try them in standing with one foot resting on the tub, or in other positions.  This is a  good stretch to do in the shower if you don't need to use lubricant.   [facility] [address], [address] [place], [place] [address] Phone # [phone] Fax [phone]

## 2014-08-04 ENCOUNTER — Encounter (INDEPENDENT_AMBULATORY_CARE_PROVIDER_SITE_OTHER): Payer: Self-pay

## 2014-08-04 ENCOUNTER — Ambulatory Visit: Payer: Self-pay

## 2014-08-04 ENCOUNTER — Ambulatory Visit (INDEPENDENT_AMBULATORY_CARE_PROVIDER_SITE_OTHER): Payer: Self-pay | Admitting: Pediatrics

## 2014-08-04 ENCOUNTER — Encounter: Payer: Self-pay | Admitting: Pediatrics

## 2014-08-04 VITALS — BP 101/63 | HR 77 | Ht 64.0 in | Wt 144.8 lb

## 2014-08-04 DIAGNOSIS — M7918 Myalgia, other site: Secondary | ICD-10-CM

## 2014-08-04 DIAGNOSIS — R109 Unspecified abdominal pain: Secondary | ICD-10-CM

## 2014-08-04 DIAGNOSIS — R300 Dysuria: Secondary | ICD-10-CM

## 2014-08-04 DIAGNOSIS — M791 Myalgia: Secondary | ICD-10-CM

## 2014-08-04 DIAGNOSIS — G8929 Other chronic pain: Secondary | ICD-10-CM

## 2014-08-04 LAB — POCT URINALYSIS DIPSTICK
BILIRUBIN UA: NORMAL
Blood, UA: NORMAL
Glucose, UA: NEGATIVE
Ketones, UA: NEGATIVE
Leukocytes, UA: NEGATIVE
NITRITE UA: NEGATIVE
PH UA: 5
Spec Grav, UA: 1.02
Urobilinogen, UA: NEGATIVE

## 2014-08-04 NOTE — Progress Notes (Signed)
Adolescent Medicine Consultation Follow-Up Visit Jamie Lutz  is a 19 y.o. female referred by Dory Peru, MD here today for follow-up of chronic abdominal pain.   Previsit planning completed:  no  Growth Chart Viewed? yes  PCP Confirmed?  yes   History was provided by the patient.  HPI:  Jamie Lutz is a 19 y.o. female with a history of chronic abdominal pain, paresthesia, and constipation who presents for follow up of her abdominal pain.  Jamie Lutz has chronic LLQ abdominal pain/pelvic pain with an extensive prior work. He pain is thought to be secondary to myofascial abdominal wall pain. She describes it as a constant, dull pain. She was referred to PT at her last visit and has had 2 sessions so far. She reports that it is going well and she has noticed improvement in her abdominal pain after the therapy. She was started on Colace for constipation thought to be contributing to the pain. She reports that she stopped taking it because it didn't seem to help, and her constipation has gradually improved with her drinking tea.   She endorses burning with urination since Monday. She also has increased frequency and occasional urgency. Denies hematuria, fevers, chills, nausea/vomiting, diarrhea, or vaginal discharge. She has not used an new soaps or products that could explain the burning. She thinks she may have strained something when she was doing PT.   At her last visit, she endorses some depressive symptoms and anxiety. She reports that she occasionally gets stressed out over bills or her kids being sick, but feels she has good support from her family and her mood is overall better. Denies thoughts of self harm.   She is not currently sexually active and can't remember the last time she had sex. Her current method of birth control is abstinence and she is not interested in discussing other forms of contraception today. She thinks she may want more children in the future (has 2 kids  currently).    Patient's last menstrual period was 07/15/2014 (exact date).  The following portions of the patient's history were reviewed and updated as appropriate: allergies, current medications, past family history, past medical history, past social history, past surgical history and problem list.  No Known Allergies  Social History: Sleep: good Eating Habits: trying to lose weight with portion control, doesn't skip meals Screen Time: <2 hours Exercise: runs/works out for 30 minutes daily School: N/A Future Plans: currently caring for her 2 kids, plans to make do something in the future   Confidentiality was discussed with the patient and if applicable, with caregiver as well.  Tobacco? no Secondhand smoke exposure?no Drugs/EtOH?no Sexually active?no Pregnancy Prevention: abstinence, reviewed condoms & plan B Safe at home, in school & in relationships? Yes Guns in the home? no Safe to self? Yes  Physical Exam:  Filed Vitals:   08/04/14 1456  BP: 101/63  Pulse: 77  Height:  (1.626 m)  Weight: 144 lb 12.8 oz (65.681 kg)   BP 101/63 mmHg  Pulse 77  Ht  (1.626 m)  Wt 144 lb 12.8 oz (65.681 kg)  BMI 24.84 kg/m2  LMP 07/15/2014 (Exact Date) Body mass index: body mass index is 24.84 kg/(m^2). Facility age limit for growth percentiles is 20 years.  Physical Exam  Constitutional: She is oriented to person, place, and time. She appears well-developed and well-nourished. No distress.  HENT:  Head: Normocephalic and atraumatic.  Mouth/Throat: Oropharynx is clear and moist.  Eyes: Conjunctivae are normal. Pupils  are equal, round, and reactive to light.  Neck: Normal range of motion. Neck supple.  Cardiovascular: Normal rate, regular rhythm and normal heart sounds.   No murmur heard. Pulmonary/Chest: Effort normal and breath sounds normal.  Abdominal: Soft. Bowel sounds are normal. She exhibits no distension and no mass. There is tenderness. There is no rebound  and no guarding.  Mild LLQ and RLQ tenderness to palpation  Musculoskeletal: Normal range of motion.  Neurological: She is alert and oriented to person, place, and time. No cranial nerve deficit.  Skin: Skin is warm and dry.  Psychiatric: She has a normal mood and affect. Her behavior is normal. Thought content normal.    Assessment/Plan: Richardson DoppMaria Tello-Banda is a 19 y.o. female with a history of chronic abdominal pain, paresthesia, and constipation who presents for follow up of her abdominal pain. He pain has improved since starting PT, although she has only had 2 sessions. She is also complaining of a 4 day history of dysuria concerning for UTI, however UA is negative for nit/LE. Discomfort may be secondary to strain from PT.   1. Dysuria - POCT urinalysis dipstick negative for signs of UTI   2. Chronic abdominal pain/Myofascial pain - Continue physical therapy  - Follow up in 3 months   Follow-up:  Return in about 3 months (around 11/04/2014) for abdominal pain f/u.   Medical decision-making:  > 25 minutes spent, more than 50% of appointment was spent discussing diagnosis and management of symptoms

## 2014-08-07 ENCOUNTER — Ambulatory Visit: Payer: No Typology Code available for payment source | Admitting: Physical Therapy

## 2014-08-07 ENCOUNTER — Encounter: Payer: Self-pay | Admitting: Physical Therapy

## 2014-08-07 DIAGNOSIS — M25552 Pain in left hip: Secondary | ICD-10-CM

## 2014-08-07 DIAGNOSIS — M6289 Other specified disorders of muscle: Secondary | ICD-10-CM

## 2014-08-07 DIAGNOSIS — R1032 Left lower quadrant pain: Principal | ICD-10-CM

## 2014-08-07 DIAGNOSIS — R1031 Right lower quadrant pain: Secondary | ICD-10-CM

## 2014-08-07 NOTE — Patient Instructions (Addendum)
Therapeutic - Bridging    Put ball or folded pillow between knees. Squeeze ball first then lift buttocks slowly, keeping back straight and arms on floor. Hold 5 seconds then lower slowly.  Hold ____ seconds. Repeat __6__ times. Do this 3x day  Hamstring Step 1   Straighten left knee. Keep knee level with other knee or on bolster. Hold __20_ seconds or 3-4 breaths. Relax knee by returning foot to start. Repeat _3__ times. Do 2 x day   Outer Hip Stretch: Reclined IT Band Stretch (Strap)   Strap around opposite foot, pull across only as far as possible with shoulders on mat. Hold for _3-4___ breaths. Repeat __3__ times each leg. Do 2x day  Lower abdominal/core stability exercises  1. Practice your breathing technique: Inhale through your nose expanding your belly and rib cage. Try not to breathe into your chest. Exhale slowly and gradually out your mouth feeling a sense of softness to your body. Practice multiple times. This can be performed unlimited.  2. Finding the lower abdominals. Laying on your back with the knees bent, place your fingers just below your belly button. Using your breathing technique from above, on your exhale gently pull the belly button away from your fingertips without tensing any other muscles. Practice this 5x. Next, as you exhale, draw belly button inwards and hold onto it...then feel as if you are pulling that muscle across your pelvis like you are tightening a belt. This can be hard to do at first so be patient and practice. Do 5-10 reps 1-3 x day. Always recognize quality over quantity; if your abdominal muscles become tired you will notice you may tighten/contract other muscles. This is the time to take a break.   Practice this first laying on your back, then in sitting, progressing to standing and finally adding it to all your daily movements.   3. Finding your pelvic floor. Using the breathing technique above, when your exhale, this time draw your pelvic floor  muscles up as if you were attempting to stop the flow of urination. Be careful NOT to tense any other muscles. This can be hard, BE PATIENT. Try to hold up to 10 seconds repeating 10x. Try 2x a day. Once you feel you are doing this well, add this contraction to exercise #2. First contracting your pelvic floor followed by lower abdominals.  4. Adding leg movements. Add the following leg movements to challenge your ability to keep your core stable:  1. Single leg drop outs: Laying on your back with knees bent feet flat. Inhale,  dropping one knee outward KEEPING YOUR PELVIS STILL. Exhale as you bring the leg back, simultaneously performing your lower abdominal contraction. Do 5-10 on each leg.  2. Marching: While keeping your pelvis still, lift the right foot a few inches, put it down then lift left foot. This will mimic a march. Start slow to establish control. Once you have control you may speed it up. Do 10-20x. You MUST keep your lower abdominlas contracted while you march. Breathe naturally   3. Single leg slides: Inhale while you slowly slide one leg out keeping your pelvis still. Only slide your leg as far as you can keep your pelvis still. Exhale as you bring the leg back to the start, contracting the lower abdominals as you do that. Keep your upper body relaxed. Do 5-10 on each side.  Copyright  VHI. All rights reserved.    Copyright  VHI. All rights reserved.    Copyright  VHI. All rights reserved.

## 2014-08-07 NOTE — Therapy (Signed)
[facility] [address] [place], [place], [address] Phone: [phone]   Fax:  [phone]  Physical Therapy Treatment  Patient Details  Name: Jamie Lutz MRN: [id number] Date of Birth: 01/25/1995 Referring Provider:  Dillon Bjork, MD  Encounter Date: 08/07/2014      PT End of Session - 08/07/14 1131    Visit Number 4   Number of Visits 12   Date for PT Re-Evaluation 08/21/14   PT Start Time 7622   PT Stop Time 1131   PT Time Calculation (min) 40 min   Activity Tolerance Patient tolerated treatment well   Behavior During Therapy [facility] for tasks assessed/performed      Past Medical History  Diagnosis Date  . No pertinent past medical history     Past Surgical History  Procedure Laterality Date  . No past surgeries    . Perineal laceration repair  04/17/2011    Procedure: SUTURE REPAIR PERINEAL LACERATION;  Surgeon: Jonnie Kind, MD;  Location: [facility];  Service: Gynecology;  Laterality: N/A;    There were no vitals filed for this visit.  Visit Diagnosis:  Abdominal pain, bilateral lower quadrant  Pain in the hip, left  Muscle tightness      Subjective Assessment - 08/07/14 1055    Subjective The front of her left leg down to the ankle have hurt more since last visit. She thinks the "pushing" made it hurt. Yesterday had a lot of abdominal pain. Today not as bad.   Currently in Pain? Yes   Pain Score 3    Pain Location Abdomen  All the way down the front of the leg   Pain Orientation Left   Pain Descriptors / Indicators Dull   Aggravating Factors  Not sure the last few days   Pain Relieving Factors Corset   Multiple Pain Sites No                         [facility] Adult PT Treatment/Exercise - 08/07/14 0001    Knee/Hip Exercises: Stretches   Active Hamstring Stretch Both;3 reps;20 seconds   ITB Stretch Left;3 reps;20 seconds   Knee/Hip Exercises: Supine   Bridges with  Ball Squeeze Strengthening;Both;2 sets;5 sets   Other Supine Knee/Hip Exercises TA contraction with breathing 2x5                PT Education - 08/07/14 1111    Education provided Yes   Education Details HEP including bridging, ham & IT band stretch with strap and TA contraction   Person(s) Educated Patient   Methods Explanation;Demonstration;Tactile cues;Verbal cues;Handout   Comprehension Verbalized understanding;Returned demonstration          PT Short Term Goals - 08/07/14 1126    PT SHORT TERM GOAL #1   Title Patient will be i with initial HEP   Time 4   Period Weeks   Status Achieved   PT SHORT TERM GOAL #2   Title Pt will be able to sit 25% more comfortably for meals, riding in car   Time 4   Period Weeks   Status On-going  Reports no different   PT SHORT TERM GOAL #3   Title Pt will be able to lift, bathe kids with min pain in LLQ   Time 4   Period Weeks   Status Achieved  Mild   PT SHORT TERM GOAL #4   Time 4   Period Weeks  Status On-going  Continue to work on           Sharkey - 07/18/14 Keith #1   Title Pt will be I with more advanced HEP, self care   Time 8   Period Weeks   Status New   PT LONG TERM GOAL #2   Title Pt will be able to report pain <2/10 with ADLs and mobility    Time 8   Period Weeks   Status New   PT LONG TERM GOAL #3   Title Pt will be able to sit as needed without pain in LLQ   Time 8   Period Weeks   Status New   PT LONG TERM GOAL #4   Title pain with intercourse decreased >/= 75%   Time 8   Period Weeks   Status New   PT LONG TERM GOAL #5   Title abdominal strength 4/5 with reduction of diastasis Recti to 1 finger width to assist in reduction in constipation and decreased urgency with urine   Time 8   Period Weeks   Status New               Plan - 08/07/14 1131    Clinical Impression Statement Pt's Lt ilium was pretty significantly rotated anteriorly today. We  used the exercises as MET, specifically bridging and this corrected after 3 sets.  Pain was also reduced at end of treatment per pt report. She is meeting more STGs this week . We added core stabilization and hip stretching to HEP this week.    Pt will benefit from skilled therapeutic intervention in order to improve on the following deficits Decreased range of motion;Increased fascial restricitons;Dizziness;Decreased activity tolerance;Pain;Impaired flexibility;Decreased balance;Decreased mobility;Decreased strength;Increased muscle spasms   Rehab Potential Good   PT Frequency 2x / week   PT Duration 8 weeks   PT Treatment/Interventions ADLs/Self Care Home Management;Ultrasound;Neuromuscular re-education;Passive range of motion;Functional mobility training;Iontophoresis 6m/ml Dexamethasone;Moist Heat;Therapeutic exercise;Manual techniques;Therapeutic activities;Cryotherapy;Electrical Stimulation;Dry needling;Balance training;Biofeedback;Patient/family education   PT Next Visit Plan Review HEP, assess pelvis and advance core strengthening exercises.   Consulted and Agree with Plan of Care Patient        Problem List Patient Active Problem List   Diagnosis Date Noted  . 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 Chronic abdominal pain 05/03/2014  . Constipation 05/03/2014  . Paresthesia 02/06/2014  . Teenage mother 02/03/2014    CMyrene Galas PTA 08/07/2014, 11:35 AM  [facility] [address] [place] [address]Phone: [phone]  Fax:  [phone]    During bridging exercises, PTA used the pelvic model to show pt how it can be become out of alignment via muscle inbalance. She verbally understood concepts.

## 2014-08-14 ENCOUNTER — Encounter: Payer: Self-pay | Admitting: Physical Therapy

## 2014-08-14 ENCOUNTER — Ambulatory Visit: Payer: No Typology Code available for payment source | Admitting: Physical Therapy

## 2014-08-14 DIAGNOSIS — M25552 Pain in left hip: Secondary | ICD-10-CM

## 2014-08-14 DIAGNOSIS — M6289 Other specified disorders of muscle: Secondary | ICD-10-CM

## 2014-08-14 NOTE — Progress Notes (Signed)
Attending Physician Co-Signature  I saw and evaluated the patient, performing the key elements of the service.  I developed  the management plan that is described in the resident's note, and I agree with the content.  Graciela Plato FAIRBANKS, MD  

## 2014-08-14 NOTE — Therapy (Signed)
[facility] [address], [place], [place], [address] Phone: [phone]   Fax:  [phone]  Physical Therapy Treatment  Patient Details  Name: Jamie Lutz MRN: [id number] Date of Birth: 30-Dec-1995 Referring Provider:  Gaspar Skeeters, MD  Encounter Date: 08/14/2014      PT End of Session - 08/14/14 1130    Visit Number 5   Number of Visits 12   Date for PT Re-Evaluation 08/21/14   PT Start Time 1058   PT Stop Time 1140   PT Time Calculation (min) 42 min   Activity Tolerance Patient tolerated treatment well   Behavior During Therapy [facility] for tasks assessed/performed      Past Medical History  Diagnosis Date  . No pertinent past medical history     Past Surgical History  Procedure Laterality Date  . No past surgeries    . Perineal laceration repair  04/17/2011    Procedure: SUTURE REPAIR PERINEAL LACERATION;  Surgeon: Jonnie Kind, MD;  Location: [facility];  Service: Gynecology;  Laterality: N/A;    There were no vitals filed for this visit.  Visit Diagnosis:  Muscle tightness  Pain in the hip, left      Subjective Assessment - 08/14/14 1102    Subjective Pain in leg went away. She reports seeing MD for pain when urinating but no infection per MD. No other complaints this AM.    Currently in Pain? No/denies   Multiple Pain Sites No                         [facility] Adult PT Treatment/Exercise - 08/14/14 0001    Lumbar Exercises: Stretches   Active Hamstring Stretch 3 reps;20 seconds   ITB Stretch 3 reps;20 seconds   Lumbar Exercises: Aerobic   Stationary Bike L1x 6 min   UBE (Upper Arm Bike) Sitting on green ball for postural awareness: L1 3x3   Lumbar Exercises: Supine   Clam 5 reps   Clam Limitations Did in supine bil   Bent Knee Raise 10 reps   Bent Knee Raise Limitations Bil   Bridge 10 reps;3 seconds   Bridge Limitations Batt bt knees, VC to keep pelvis level   Other Supine Lumbar Exercises Knee extensions, bil smaller on LTLE 10 bil   Lumbar Exercises: Prone   Straight Leg Raise 5 reps;1 second   Other Prone Lumbar Exercises TA contraction with breathe 10x                PT Education - 08/14/14 1117    Education provided Yes   Education Details TA ex with leg movements, prone exercises   Methods Explanation;Demonstration;Tactile cues;Verbal cues;Handout   Comprehension Verbalized understanding;Returned demonstration          PT Short Term Goals - 08/14/14 1108    PT SHORT TERM GOAL #1   Title Patient will be i with initial HEP   Time 4   Period Weeks   Status Achieved   PT SHORT TERM GOAL #2   Title Pt will be able to sit 25% more comfortably for meals, riding in car   Time 4   Period Weeks   Status Achieved  25%   PT SHORT TERM GOAL #3   Title Pt will be able to lift, bathe kids with min pain in LLQ   Time 4   Period Weeks   Status Achieved   PT SHORT TERM GOAL #  4   Title Pt will be i with concepts of RICE, stretching, posture   Time 4   Period Weeks   Status Achieved  Pt able to verbalize today througout tx           PT Long Term Goals - 07/18/14 1453    PT LONG TERM GOAL #1   Title Pt will be I with more advanced HEP, self care   Time 8   Period Weeks   Status New   PT LONG TERM GOAL #2   Title Pt will be able to report pain <2/10 with ADLs and mobility    Time 8   Period Weeks   Status New   PT LONG TERM GOAL #3   Title Pt will be able to sit as needed without pain in LLQ   Time 8   Period Weeks   Status New   PT LONG TERM GOAL #4   Title pain with intercourse decreased >/= 75%   Time 8   Period Weeks   Status New   PT LONG TERM GOAL #5   Title abdominal strength 4/5 with reduction of diastasis Recti to 1 finger width to assist in reduction in constipation and decreased urgency with urine   Time 8   Period Weeks   Status New               Plan - 08/14/14 1131    Clinical  Impression Statement Pelvis level today, pain is down 25% and pt met all STGs today. Core and hip extensors remains weak.    Pt will benefit from skilled therapeutic intervention in order to improve on the following deficits Decreased range of motion;Increased fascial restricitons;Dizziness;Decreased activity tolerance;Pain;Impaired flexibility;Decreased balance;Decreased mobility;Decreased strength;Increased muscle spasms   Rehab Potential Good   PT Frequency 2x / week   PT Duration 8 weeks   PT Treatment/Interventions ADLs/Self Care Home Management;Ultrasound;Neuromuscular re-education;Passive range of motion;Functional mobility training;Iontophoresis 33m/ml Dexamethasone;Moist Heat;Therapeutic exercise;Manual techniques;Therapeutic activities;Cryotherapy;Electrical Stimulation;Dry needling;Balance training;Biofeedback;Patient/family education   PT Next Visit Plan Review HEP continue with core and hip exs for strength   Consulted and Agree with Plan of Care Patient        Problem List Patient Active Problem List   Diagnosis Date Noted  . 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 Chronic abdominal pain 05/03/2014  . Constipation 05/03/2014  . Paresthesia 02/06/2014  . Teenage mother 02/03/2014    CMyrene Galas PTA 08/14/2014, 11:42 AM  [facility] [address] [place] [address]Phone: [phone]  Fax:  [phone]

## 2014-08-14 NOTE — Patient Instructions (Addendum)
Lower abdominal/core stability exercises  1. Practice your breathing technique: Inhale through your nose expanding your belly and rib cage. Try not to breathe into your chest. Exhale slowly and gradually out your mouth feeling a sense of softness to your body. Practice multiple times. This can be performed unlimited.  2. Finding the lower abdominals. Laying on your back with the knees bent, place your fingers just below your belly button. Using your breathing technique from above, on your exhale gently pull the belly button away from your fingertips without tensing any other muscles. Practice this 5x. Next, as you exhale, draw belly button inwards and hold onto it...then feel as if you are pulling that muscle across your pelvis like you are tightening a belt. This can be hard to do at first so be patient and practice. Do 5-10 reps 1-3 x day. Always recognize quality over quantity; if your abdominal muscles become tired you will notice you may tighten/contract other muscles. This is the time to take a break.   Practice this first laying on your back, then in sitting, progressing to standing and finally adding it to all your daily movements.   3. Finding your pelvic floor. Using the breathing technique above, when your exhale, this time draw your pelvic floor muscles up as if you were attempting to stop the flow of urination. Be careful NOT to tense any other muscles. This can be hard, BE PATIENT. Try to hold up to 10 seconds repeating 10x. Try 2x a day. Once you feel you are doing this well, add this contraction to exercise #2. First contracting your pelvic floor followed by lower abdominals.   4. Adding leg movements. Add the following leg movements to challenge your ability to keep your core stable:  1. Single leg drop outs: Laying on your back with knees bent feet flat. Inhale,  dropping one knee outward KEEPING YOUR PELVIS STILL. Exhale as you bring the leg back, simultaneously performing your lower  abdominal contraction. Do 5-10 on each leg.   2. Marching: While keeping your pelvis still, lift the right foot a few inches, put it down then lift left foot. This will mimic a march. Start slow to establish control. Once you have control you may speed it up. Do 10-20x. You MUST keep your lower abdominlas contracted while you march. Breathe naturally    3. Single leg slides: Inhale while you slowly slide one leg out keeping your pelvis still. Only slide your leg as far as you can keep your pelvis still. Exhale as you bring the leg back to the start, contracting the lower abdominals as you do that. Keep your upper body relaxed. Do 5-10 on each side.        Hip Extension   First, do JUST lower ab contraction 5 before you do leg lifting. Lying face down,engage lower abs first then raise leg just off floor. Keep leg straight. Hold 1 count. Lower leg to floor. Repeat ___5-8_ times each leg. Optional: Place small pillow under abdomen.  Copyright  VHI. All rights reserved.

## 2014-08-21 ENCOUNTER — Ambulatory Visit: Payer: No Typology Code available for payment source | Attending: Pediatrics | Admitting: Physical Therapy

## 2014-08-21 ENCOUNTER — Encounter: Payer: Self-pay | Admitting: Physical Therapy

## 2014-08-21 DIAGNOSIS — R102 Pelvic and perineal pain: Secondary | ICD-10-CM

## 2014-08-21 DIAGNOSIS — R1032 Left lower quadrant pain: Secondary | ICD-10-CM | POA: Insufficient documentation

## 2014-08-21 DIAGNOSIS — M25552 Pain in left hip: Secondary | ICD-10-CM | POA: Insufficient documentation

## 2014-08-21 DIAGNOSIS — R1031 Right lower quadrant pain: Secondary | ICD-10-CM | POA: Insufficient documentation

## 2014-08-21 DIAGNOSIS — N949 Unspecified condition associated with female genital organs and menstrual cycle: Secondary | ICD-10-CM | POA: Insufficient documentation

## 2014-08-21 DIAGNOSIS — G729 Myopathy, unspecified: Secondary | ICD-10-CM | POA: Insufficient documentation

## 2014-08-21 DIAGNOSIS — M6289 Other specified disorders of muscle: Secondary | ICD-10-CM

## 2014-08-21 NOTE — Therapy (Signed)
[facility] [address] [place], [place], [address] Phone: [phone]   Fax:  [phone]  Physical Therapy Treatment  Patient Details  Name: Jamie Lutz MRN: [id number] Date of Birth: 1995/01/31 Referring Provider:  Owens Shark, MD  Encounter Date: 08/21/2014      PT End of Session - 08/21/14 1150    Visit Number 6   Date for PT Re-Evaluation 10/16/14   PT Start Time 1145   PT Stop Time 1225   PT Time Calculation (min) 40 min   Activity Tolerance Patient tolerated treatment well   Behavior During Therapy [facility] for tasks assessed/performed      Past Medical History  Diagnosis Date  . No pertinent past medical history     Past Surgical History  Procedure Laterality Date  . No past surgeries    . Perineal laceration repair  04/17/2011    Procedure: SUTURE REPAIR PERINEAL LACERATION;  Surgeon: Tilda Burrow, MD;  Location: [facility];  Service: Gynecology;  Laterality: N/A;    There were no vitals filed for this visit.  Visit Diagnosis:  Muscle tightness - Plan: PT plan of care cert/re-cert  Pain in the hip, left - Plan: PT plan of care cert/re-cert  Abdominal pain, bilateral lower quadrant - Plan: PT plan of care cert/re-cert  Perineal pain in female - Plan: PT plan of care cert/re-cert      Subjective Assessment - 08/21/14 1152    Subjective Yesterday I had pain in left hip and trouble laying on it and when move heard a pop.    Patient is accompained by: Family member   Pertinent History pregnancies 1st 04/16/11 and 2nd 06/11/12   Limitations Lifting;Sitting   How long can you sit comfortably? usually uncomfortable   How long can you stand comfortably? does not affect   How long can you walk comfortably? does not affect in fact may relieve to a degree   Diagnostic tests XR, [place] without (neg)   Patient Stated Goals would like to have less discomfort, anxiety and not have to go to the bathroom  all day (every 30 min)   Currently in Pain? Yes   Pain Score 4    Pain Location Hip   Pain Orientation Left   Pain Descriptors / Indicators Dull   Pain Type Chronic pain   Pain Onset More than a month ago   Pain Frequency Constant   Aggravating Factors  Not sure   Pain Relieving Factors corset   Effect of Pain on Daily Activities cannot lift or carry kids   Multiple Pain Sites No            [facility] PT Assessment - 08/21/14 0001    Assessment   Medical Diagnosis abdominal pain    Next MD Visit Dr. Marina Goodell this week   Precautions   Precautions None   Prior Function   Level of Independence Independent   Observation/Other Assessments   Focus on Therapeutic Outcomes (FOTO)  NT   AROM   Right Hip Flexion 115  90    Left Hip Flexion 115  94   Strength   Right Hip Flexion 4+/5   Right Hip Extension 4/5   Right Hip ABduction 4/5   Left Hip Flexion 4+/5   Left Hip Extension 4/5   Left Hip ABduction 4/5  pain   Palpation   SI assessment  left ilium is posteriorly rotated; sacrum rotated left   Palpation comment after  muscle energy ilium in corrrect position                  Pelvic Floor Special Questions - 08/21/14 0001    Diastasis Recti 1/2 finger   Marinoff Scale discomfort that does not affect completion   Pelvic Floor Internal Exam Patient confirms identification and approves physical therapist to perform pelvic floor muscle assessment and integrity   Exam Type Vaginal   Palpation left obturator internist, levator ani, and bil. sides of urethra           [facility] Adult PT Treatment/Exercise - 08/21/14 0001    Manual Therapy   Manual Therapy Muscle Energy Technique;Joint mobilization;Soft tissue mobilization   Joint Mobilization left hip for distraction, posterior gilde, inferior glide   Soft tissue mobilization left iliotibial band, left quads, left hamstring   Muscle Energy Technique to correct posterior rotated ilium                  PT  Short Term Goals - 08/14/14 1108    PT SHORT TERM GOAL #1   Title Patient will be i with initial HEP   Time 4   Period Weeks   Status Achieved   PT SHORT TERM GOAL #2   Title Pt will be able to sit 25% more comfortably for meals, riding in car   Time 4   Period Weeks   Status Achieved  25%   PT SHORT TERM GOAL #3   Title Pt will be able to lift, bathe kids with min pain in LLQ   Time 4   Period Weeks   Status Achieved   PT SHORT TERM GOAL #4   Title Pt will be i with concepts of RICE, stretching, posture   Time 4   Period Weeks   Status Achieved  Pt able to verbalize today througout tx           PT Long Term Goals - 08/21/14 1151    PT LONG TERM GOAL #1   Title Pt will be I with more advanced HEP, self care   Time 8   Period Weeks   Status On-going   PT LONG TERM GOAL #2   Title Pt will be able to report pain <2/10 with ADLs and mobility    Time 8   Period Weeks   Status --  4/10   PT LONG TERM GOAL #3   Title Pt will be able to sit as needed without pain in LLQ   Time 8   Status On-going  4/10   PT LONG TERM GOAL #4   Title pain with intercourse decreased >/= 75%   Status On-going  no change   PT LONG TERM GOAL #5   Title abdominal strength 4/5 with reduction of diastasis Recti to 1 finger width to assist in reduction in constipation and decreased urgency with urine   Time 8   Period Weeks   Status On-going               Plan - 08/21/14 1225    Clinical Impression Statement Patient is a 19 year old female with pelvic, left hip and back pain.  Patient left hip strength has improved with flexion bil. to 4+/5.  Left hip flexion AROM has improved to 115 degrees. Diatasis Recti has improved from 2 fingers width to 1/2. Patient Marinoff scale has improved from 2/3 to 1/3 and able to have intercourse but pain level 5/10.   Patient pelvis can  be corrected  with muscle energy but does not stay. Palpable tenderness located in perineal area, left obturator  internist, left levator ani.  Patient would benefit from physical therapy to improve core strength, decrease pain and improve pelvic strength when pain has reduced.    Pt will benefit from skilled therapeutic intervention in order to improve on the following deficits Decreased range of motion;Increased fascial restricitons;Dizziness;Decreased activity tolerance;Pain;Impaired flexibility;Decreased balance;Decreased mobility;Decreased strength;Increased muscle spasms   Rehab Potential Good   Clinical Impairments Affecting Rehab Potential None   PT Frequency 2x / week   PT Duration 8 weeks   PT Treatment/Interventions ADLs/Self Care Home Management;Ultrasound;Neuromuscular re-education;Passive range of motion;Functional mobility training;Iontophoresis 4mg /ml Dexamethasone;Moist Heat;Therapeutic exercise;Manual techniques;Therapeutic activities;Cryotherapy;Electrical Stimulation;Dry needling;Balance training;Biofeedback;Patient/family education   PT Next Visit Plan Review HEP continue with core and hip exs for strength   PT Home Exercise Plan abdominal bracing   Consulted and Agree with Plan of Care Patient   Family Member Consulted none        Problem List Patient Active Problem List   Diagnosis Date Noted  . 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 Chronic abdominal pain 05/03/2014  . Constipation 05/03/2014  . Paresthesia 02/06/2014  . Teenage mother 02/03/2014    GRAY,CHERYL,PT 08/21/2014, 12:32 PM  [facility] [address], [address] [place], [place], [address] Phone: [phone]   Fax:  [phone]

## 2014-08-28 ENCOUNTER — Ambulatory Visit: Payer: No Typology Code available for payment source | Admitting: Physical Therapy

## 2014-08-28 ENCOUNTER — Encounter: Payer: Self-pay | Admitting: Physical Therapy

## 2014-08-28 DIAGNOSIS — M6289 Other specified disorders of muscle: Secondary | ICD-10-CM

## 2014-08-28 DIAGNOSIS — M25552 Pain in left hip: Secondary | ICD-10-CM

## 2014-08-28 DIAGNOSIS — R1031 Right lower quadrant pain: Secondary | ICD-10-CM

## 2014-08-28 DIAGNOSIS — R1032 Left lower quadrant pain: Secondary | ICD-10-CM

## 2014-08-28 NOTE — Patient Instructions (Signed)
    http://orth.exer.us/244   Copyright  VHI. All rights reserved.       Flexion   Sitting on knees, fold body over legs and relax head and arms on floor. Extend arms as far forward as possible. Hold _10___ seconds. Repeat _1-3___ times. Do __1-3__ sessions per day.  Copyright  VHI. All rights reserved.  Combination (Quadruped)  On hands and knees with towel roll between knees, slowly inhale, and then exhale. Pull navel toward spine, squeeze roll with knees, and tighten pelvic floor. Hold for 5___ seconds. Rest for _5__ seconds. Repeat 5___ times. Do _1-2__ times a day.    Quadruped Alternate Hip Extension   Shift weight to one side and raise opposite leg. Keep trunk steady. __5-8_ reps per set, _1__ set, 2xday Repeat with other leg.  Bracing With Arm / Leg Raise (Quadruped) NOT YET!! Will add soon.  On hands and knees find neutral spine. Tighten pelvic floor and abdominals and hold. Alternating, lift arm to shoulder level and opposite leg to hip level. Repeat ___ times. Do ___ times a day.

## 2014-08-28 NOTE — Therapy (Signed)
[facility] [address], [address] [place], [place], [address] Phone: [phone]   Fax:  [phone]  Physical Therapy Treatment  Patient Details  Name: Jamie Lutz MRN: [id number] Date of Birth: 08/01/1995 Referring Provider:  Owens Shark, MD  Encounter Date: 08/28/2014      PT End of Session - 08/28/14 1118    Visit Number 7   Number of Visits 12   Date for PT Re-Evaluation 10/16/14   PT Start Time 1108   PT Stop Time 1146   PT Time Calculation (min) 38 min   Activity Tolerance Patient tolerated treatment well   Behavior During Therapy [facility] for tasks assessed/performed      Past Medical History  Diagnosis Date  . No pertinent past medical history     Past Surgical History  Procedure Laterality Date  . No past surgeries    . Perineal laceration repair  04/17/2011    Procedure: SUTURE REPAIR PERINEAL LACERATION;  Surgeon: Tilda Burrow, MD;  Location: [facility];  Service: Gynecology;  Laterality: N/A;    There were no vitals filed for this visit.  Visit Diagnosis:  Muscle tightness  Pain in the hip, left  Abdominal pain, bilateral lower quadrant      Subjective Assessment - 08/28/14 1111    Subjective I was very sore after internal work last session. Sore till Friday. Pt late.   Currently in Pain? No/denies   Multiple Pain Sites No                         [facility] Adult PT Treatment/Exercise - 08/28/14 0001    Lumbar Exercises: Stretches   Quadruped Mid Back Stretch --  childs pose after quad exs   Lumbar Exercises: Aerobic   UBE (Upper Arm Bike) Sitting on red ball L1 3x3   Lumbar Exercises: Quadruped   Straight Leg Raise 5 reps;2 seconds   Straight Leg Raises Limitations VC/TC for form   Knee/Hip Exercises: Aerobic   Nustep L3 x 8 min   Knee/Hip Exercises: Supine   Bridges with Newman Pies Squeeze Strengthening;Both;2 sets;10 reps   Bridges with Clamshell Strengthening;Both;2  sets;10 reps   Straight Leg Raises Both;1 set;5 reps   Straight Leg Raises Limitations VC to keep pelvis level                PT Education - 08/28/14 1134    Education provided Yes   Education Details HEP, quadruped led extension   Person(s) Educated Patient   Methods Explanation;Demonstration;Tactile cues;Verbal cues;Handout   Comprehension Verbalized understanding;Returned demonstration          PT Short Term Goals - 08/14/14 1108    PT SHORT TERM GOAL #1   Title Patient will be i with initial HEP   Time 4   Period Weeks   Status Achieved   PT SHORT TERM GOAL #2   Title Pt will be able to sit 25% more comfortably for meals, riding in car   Time 4   Period Weeks   Status Achieved  25%   PT SHORT TERM GOAL #3   Title Pt will be able to lift, bathe kids with min pain in LLQ   Time 4   Period Weeks   Status Achieved   PT SHORT TERM GOAL #4   Title Pt will be i with concepts of RICE, stretching, posture   Time 4   Period Weeks   Status Achieved  Pt able to verbalize today througout tx           PT Long Term Goals - 08/28/14 1112    PT LONG TERM GOAL #1   Title Pt will be I with more advanced HEP, self care   Time 8   Period Weeks   Status On-going   PT LONG TERM GOAL #2   Title Pt will be able to report pain <2/10 with ADLs and mobility    Time 8   Period Weeks   Status On-going  Gives no consistent number, still pretty variable   PT LONG TERM GOAL #3   Title Pt will be able to sit as needed without pain in LLQ   Time 8   Period Weeks   Status On-going  4/10   PT LONG TERM GOAL #4   Title pain with intercourse decreased >/= 75%   Time 8   Period Weeks   Status On-going               Plan - 08/28/14 1137    Clinical Impression Statement Pt perfromed higher volume and load today, focusing on her core strength in addition to overall conditoining, pelvic floor strength and hip strength. Gaols about the same as last week. Pt had no pain  today.    Pt will benefit from skilled therapeutic intervention in order to improve on the following deficits Decreased range of motion;Increased fascial restricitons;Dizziness;Decreased activity tolerance;Pain;Impaired flexibility;Decreased balance;Decreased mobility;Decreased strength;Increased muscle spasms   Rehab Potential Good   Clinical Impairments Affecting Rehab Potential None   PT Frequency 2x / week   PT Duration 8 weeks   PT Treatment/Interventions ADLs/Self Care Home Management;Ultrasound;Neuromuscular re-education;Passive range of motion;Functional mobility training;Iontophoresis 4mg /ml Dexamethasone;Moist Heat;Therapeutic exercise;Manual techniques;Therapeutic activities;Cryotherapy;Electrical Stimulation;Dry needling;Balance training;Biofeedback;Patient/family education   PT Next Visit Plan Core, hip strength, conditioning   Consulted and Agree with Plan of Care Patient        Problem List Patient Active Problem List   Diagnosis Date Noted  . 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 Chronic abdominal pain 05/03/2014  . Constipation 05/03/2014  . Paresthesia 02/06/2014  . Teenage mother 02/03/2014    Ane Payment, PTA 08/28/2014, 11:40 AM  [facility] [address] [place], [place], [address] Phone: [phone]   Fax:  [phone]

## 2014-08-30 ENCOUNTER — Ambulatory Visit: Payer: No Typology Code available for payment source | Admitting: Physical Therapy

## 2014-08-30 ENCOUNTER — Encounter: Payer: Self-pay | Admitting: Physical Therapy

## 2014-08-30 ENCOUNTER — Ambulatory Visit: Payer: Self-pay

## 2014-08-30 ENCOUNTER — Encounter: Payer: Self-pay | Admitting: Pediatrics

## 2014-08-30 ENCOUNTER — Ambulatory Visit (INDEPENDENT_AMBULATORY_CARE_PROVIDER_SITE_OTHER): Payer: Self-pay | Admitting: Pediatrics

## 2014-08-30 ENCOUNTER — Other Ambulatory Visit: Payer: Self-pay | Admitting: Pediatrics

## 2014-08-30 VITALS — BP 101/2 | HR 83 | Ht 63.78 in | Wt 144.0 lb

## 2014-08-30 DIAGNOSIS — Z1389 Encounter for screening for other disorder: Secondary | ICD-10-CM

## 2014-08-30 DIAGNOSIS — R1031 Right lower quadrant pain: Secondary | ICD-10-CM

## 2014-08-30 DIAGNOSIS — R1032 Left lower quadrant pain: Principal | ICD-10-CM

## 2014-08-30 DIAGNOSIS — R300 Dysuria: Secondary | ICD-10-CM

## 2014-08-30 DIAGNOSIS — M6289 Other specified disorders of muscle: Secondary | ICD-10-CM

## 2014-08-30 DIAGNOSIS — Z3202 Encounter for pregnancy test, result negative: Secondary | ICD-10-CM

## 2014-08-30 DIAGNOSIS — N730 Acute parametritis and pelvic cellulitis: Secondary | ICD-10-CM

## 2014-08-30 DIAGNOSIS — M25552 Pain in left hip: Secondary | ICD-10-CM

## 2014-08-30 LAB — POCT URINALYSIS DIPSTICK
Bilirubin, UA: NEGATIVE
Blood, UA: NEGATIVE
GLUCOSE UA: NEGATIVE
Ketones, UA: NEGATIVE
Nitrite, UA: NEGATIVE
Spec Grav, UA: 1.02
UROBILINOGEN UA: NEGATIVE
pH, UA: 7.5

## 2014-08-30 LAB — POCT URINE PREGNANCY: Preg Test, Ur: NEGATIVE

## 2014-08-30 MED ORDER — CEFTRIAXONE SODIUM 1 G IJ SOLR
250.0000 mg | Freq: Once | INTRAMUSCULAR | Status: AC
  Administered 2014-08-30: 250 mg via INTRAMUSCULAR

## 2014-08-30 MED ORDER — DOXYCYCLINE HYCLATE 100 MG PO CAPS: 100.0000 mg | ORAL_CAPSULE | Freq: Two times a day (BID) | ORAL | Status: DC

## 2014-08-30 MED ORDER — METRONIDAZOLE 500 MG PO TABS: 500.0000 mg | ORAL_TABLET | Freq: Two times a day (BID) | ORAL | Status: DC

## 2014-08-30 NOTE — Progress Notes (Signed)
THIS RECORD MAY CONTAIN CONFIDENTIAL INFORMATION THAT SHOULD NOT BE RELEASED WITHOUT REVIEW OF THE SERVICE PROVIDER.  Adolescent Medicine Consultation Follow-Up Visit Jamie Lutz  is a 19 y.o. female referred by Jonetta Osgood, MD here today for follow-up of dysuria.    Previsit planning completed:  no  Growth Chart Viewed? not applicable   History was provided by the patient.  PCP Confirmed?  yes  HPI:   Having burning in her bladder Hurts when she urinates Not all the time but occurs often No known trigger.  Urinates often, but not more than usual No fever. Occasional back pain Gets really tired and easily irritable No trouble sleeping  Patient's last menstrual period was 08/04/2014. No Known Allergies   Medication List       This list is accurate as of: 08/30/14 11:59 PM.  Always use your most recent med list.               doxycycline 100 MG capsule  Commonly known as:  VIBRAMYCIN  Take 1 capsule (100 mg total) by mouth 2 (two) times daily.     metroNIDAZOLE 500 MG tablet  Commonly known as:  FLAGYL  Take 1 tablet (500 mg total) by mouth 2 (two) times daily.        Social History: Confidentiality was discussed with the patient and if applicable, with caregiver as well.  Physical Exam:  Filed Vitals:   08/30/14 1615  BP: 101/2  Pulse: 83  Height: 5' 3.78" (1.62 m)  Weight: 144 lb (65.318 kg)   BP 101/2 mmHg  Pulse 83  Ht 5' 3.78" (1.62 m)  Wt 144 lb (65.318 kg)  BMI 24.89 kg/m2  LMP 08/04/2014 Body mass index: body mass index is 24.89 kg/(m^2). Facility age limit for growth percentiles is 20 years.  Results for orders placed or performed in visit on 08/30/14  Urine Culture  Result Value Ref Range   Culture ESCHERICHIA COLI    Colony Count 75,000 COLONIES/ML    Organism ID, Bacteria ESCHERICHIA COLI       Susceptibility   Escherichia coli -  (no method available)    AMPICILLIN >=32 Resistant     AMOX/CLAVULANIC 4 Sensitive    AMPICILLIN/SULBACTAM 16 Intermediate     PIP/TAZO <=4 Sensitive     IMIPENEM <=0.25 Sensitive     CEFTRIAXONE <=1 Sensitive     CEFTAZIDIME <=1 Sensitive     CEFEPIME <=1 Sensitive     GENTAMICIN <=1 Sensitive     TOBRAMYCIN <=1 Sensitive     CIPROFLOXACIN <=0.25 Sensitive     LEVOFLOXACIN <=0.12 Sensitive     NITROFURANTOIN <=16 Sensitive     TRIMETH/SULFA* >=320 Resistant      * ORAL therapy:A cefazolin MIC of <32 predicts susceptibility to the oral agents cefaclor,cefdinir,cefpodoxime,cefprozil,cefuroxime,cephalexin,and loracarbef when used for therapy of uncomplicated UTIs due to E.coli,K.pneumomiae,and P.mirabilis. PARENTERAL therapy: A cefazolinMIC of >8 indicates resistance to parenteralcefazolin. An alternate test method must beperformed to confirm susceptibility to parenteralcefazolin.  WET PREP BY MOLECULAR PROBE  Result Value Ref Range   Candida species NEG Negative   Trichomonas vaginosis NEG Negative   Gardnerella vaginalis NEG Negative  POCT urinalysis dipstick  Result Value Ref Range   Color, UA yellow    Clarity, UA clear    Glucose, UA neg    Bilirubin, UA neg    Ketones, UA neg    Spec Grav, UA 1.020    Blood, UA neg    pH, UA 7.5  Protein, UA trace    Urobilinogen, UA negative    Nitrite, UA neg    Leukocytes, UA Trace (A) Negative  POCT urine pregnancy  Result Value Ref Range   Preg Test, Ur Negative Negative    Physical Exam  Constitutional: No distress.  Neck: No thyromegaly present.  Cardiovascular: Normal rate and regular rhythm.   No murmur heard. Pulmonary/Chest: Breath sounds normal.  Abdominal: Soft. There is no tenderness. There is no guarding.  Genitourinary: Vagina normal. Uterus is tender. Uterus is not enlarged. Cervix exhibits motion tenderness. Cervix exhibits no discharge and no friability. Right adnexum displays no mass, no tenderness and no fullness. Left adnexum displays no mass, no tenderness and no fullness.  Musculoskeletal: She  exhibits no edema.  Lymphadenopathy:    She has no cervical adenopathy.  Nursing note and vitals reviewed.    Assessment/Plan: 1. PID (acute pelvic inflammatory disease) Patient has CMT althoug may be UTI.  UA not impressive and thus will treat for PID pending the urine culture results. - cefTRIAXone (ROCEPHIN) injection 250 mg; Inject 0.25 g (250 mg total) into the muscle once.  F/u in 48 hrs to recheck after this treatment. - metronidazole 500 mg po bid x 14 days - doxycycline 100 mg po bid x 14 days  2. Dysuria - Urine Culture - GC/Chlamydia Probe Amp - WET PREP BY MOLECULAR PROBE  3. Screening for genitourinary condition - POCT urinalysis dipstick  4. Pregnancy examination or test, negative result - POCT urine pregnancy   Follow-up:  Return in about 2 days (around 09/01/2014) for PID f/u , with Dr. Marina Goodell.   Medical decision-making:  > 25 minutes spent, more than 50% of appointment was spent discussing diagnosis and management of symptoms

## 2014-08-30 NOTE — Therapy (Signed)
[facility] [address] [place], [place], [address] Phone: [phone]   Fax:  [phone]  Physical Therapy Treatment  Patient Details  Name: Jamie Lutz MRN: [id number] Date of Birth: 05/04/1995 Referring Provider:  Owens Shark, MD  Encounter Date: 08/30/2014      PT End of Session - 08/30/14 1119    Visit Number 8   Number of Visits 12   Date for PT Re-Evaluation 10/16/14   PT Start Time 1105   PT Stop Time 1145   PT Time Calculation (min) 40 min   Activity Tolerance Patient tolerated treatment well   Behavior During Therapy [facility] for tasks assessed/performed      Past Medical History  Diagnosis Date  . No pertinent past medical history     Past Surgical History  Procedure Laterality Date  . No past surgeries    . Perineal laceration repair  04/17/2011    Procedure: SUTURE REPAIR PERINEAL LACERATION;  Surgeon: Tilda Burrow, MD;  Location: [facility];  Service: Gynecology;  Laterality: N/A;    There were no vitals filed for this visit.  Visit Diagnosis:  Abdominal pain, bilateral lower quadrant  Pain in the hip, left  Muscle tightness      Subjective Assessment - 08/30/14 1107    Subjective Muscles were sore after last visit, but no increase in pain. A little late today. Described a burning in lower abdomin yeaterday. This was abolished by laying down for 1 hour.    Currently in Pain? No/denies   Multiple Pain Sites No                         [facility] Adult PT Treatment/Exercise - 08/30/14 0001    Lumbar Exercises: Aerobic   Stationary Bike Nustep L3 x 10 min   UBE (Upper Arm Bike) Sitting on red ball L1 3x3   Lumbar Exercises: Supine   Bridge 20 reps;2 seconds   Bridge Limitations green band for hip abduction at top    Other Supine Lumbar Exercises Soft foam roll protocal for decompession and core strength   Lumbar Exercises: Quadruped   Straight Leg Raise 5 reps;3  seconds                  PT Short Term Goals - 08/14/14 1108    PT SHORT TERM GOAL #1   Title Patient will be i with initial HEP   Time 4   Period Weeks   Status Achieved   PT SHORT TERM GOAL #2   Title Pt will be able to sit 25% more comfortably for meals, riding in car   Time 4   Period Weeks   Status Achieved  25%   PT SHORT TERM GOAL #3   Title Pt will be able to lift, bathe kids with min pain in LLQ   Time 4   Period Weeks   Status Achieved   PT SHORT TERM GOAL #4   Title Pt will be i with concepts of RICE, stretching, posture   Time 4   Period Weeks   Status Achieved  Pt able to verbalize today througout tx           PT Long Term Goals - 08/28/14 1112    PT LONG TERM GOAL #1   Title Pt will be I with more advanced HEP, self care   Time 8   Period Weeks   Status On-going  PT LONG TERM GOAL #2   Title Pt will be able to report pain <2/10 with ADLs and mobility    Time 8   Period Weeks   Status On-going  Gives no consistent number, still pretty variable   PT LONG TERM GOAL #3   Title Pt will be able to sit as needed without pain in LLQ   Time 8   Period Weeks   Status On-going  4/10   PT LONG TERM GOAL #4   Title pain with intercourse decreased >/= 75%   Time 8   Period Weeks   Status On-going               Plan - 08/30/14 1120    Clinical Impression Statement Upon talking with pt during tx pt expressed a concern of having some kind of infection either kidney or bladder secondary to some burning in her lower abdomin. She reported she plans to call MD today and make appt. Continues to progress her core stretngth today as demonstrated by her ability to perform core eercises on soft foam roll.    Pt will benefit from skilled therapeutic intervention in order to improve on the following deficits Decreased range of motion;Increased fascial restricitons;Dizziness;Decreased activity tolerance;Pain;Impaired flexibility;Decreased  balance;Decreased mobility;Decreased strength;Increased muscle spasms   Rehab Potential Good   Clinical Impairments Affecting Rehab Potential None   PT Frequency 2x / week   PT Duration 8 weeks   PT Treatment/Interventions ADLs/Self Care Home Management;Ultrasound;Neuromuscular re-education;Passive range of motion;Functional mobility training;Iontophoresis 4mg /ml Dexamethasone;Moist Heat;Therapeutic exercise;Manual techniques;Therapeutic activities;Cryotherapy;Electrical Stimulation;Dry needling;Balance training;Biofeedback;Patient/family education   PT Next Visit Plan Core, hip strength, conditioning   Consulted and Agree with Plan of Care Patient        Problem List Patient Active Problem List   Diagnosis Date Noted  . 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 Chronic abdominal pain 05/03/2014  . Constipation 05/03/2014  . Paresthesia 02/06/2014  . Teenage mother 02/03/2014    Ane Payment, PTA 08/30/2014, 11:36 AM  [facility] [address] [place], [place], [address] Phone: [phone]   Fax:  [phone]

## 2014-08-30 NOTE — Patient Instructions (Signed)
Pelvic Inflammatory Disease °Pelvic inflammatory disease (PID) is an infection in some or all of the female organs. PID can be in the uterus, ovaries, fallopian tubes, or the surrounding tissues inside the lower belly area (pelvis). °HOME CARE  °· If given, take your antibiotic medicine as told. Finish them even if you start to feel better. °· Only take medicine as told by your doctor. °· Do not have sex (intercourse) until treatment is done or as told by your doctor. °· Tell your sex partner if you have PID. Your partner may need to be treated. °· Keep all doctor visits. °GET HELP RIGHT AWAY IF:  °· You have a fever. °· You have more belly (abdominal) or lower belly pain. °· You have chills. °· You have pain when you pee (urinate). °· You are not better after 72 hours. °· You have more fluid (discharge) coming from your vagina or fluid that is not normal. °· You need pain medicine from your doctor. °· You throw up (vomit). °· You cannot take your medicines. °· Your partner has a sexually transmitted disease (STD). °MAKE SURE YOU:  °· Understand these instructions. °· Will watch your condition. °· Will get help right away if you are not doing well or get worse. °Document Released: 04/04/2008 Document Revised: 05/03/2012 Document Reviewed: 01/02/2011 °ExitCare® Patient Information ©2015 [employer]. This information is not intended to replace advice given to you by your health care provider. Make sure you discuss any questions you have with your health care provider. ° °

## 2014-08-31 ENCOUNTER — Encounter: Payer: Self-pay | Admitting: Pediatrics

## 2014-08-31 LAB — WET PREP BY MOLECULAR PROBE
Candida species: NEGATIVE
Gardnerella vaginalis: NEGATIVE
TRICHOMONAS VAG: NEGATIVE

## 2014-08-31 NOTE — Progress Notes (Signed)
Pre-Visit Planning  Jamie Lutz  is a 19 y.o. female referred by Dory Peru, MD.   Last seen [facility] on 08/30/2014 for dysuria and PID.   Previous Psych Screenings?  n/a  Treatment plan at last visit included cultures sent and advised treatment for PID.   Clinical Staff Visit Tasks:   - Urine GC/CT due? no - Psych Screenings Due? n/a - Clean catch urine - Have patient prepped for repeat bimanual exam  Provider Visit Tasks: - Assess symptoms and bimanual exam - Pertinent Labs? yes,  Component     Latest Ref Rng 08/30/2014  Candida species     Negative NEG  Trichomonas vaginosis     Negative NEG  Gardnerella vaginalis     Negative NEG   GC/CT PENDING URINE CULTURE PENDING

## 2014-09-01 ENCOUNTER — Encounter: Payer: Self-pay | Admitting: Pediatrics

## 2014-09-01 ENCOUNTER — Ambulatory Visit (INDEPENDENT_AMBULATORY_CARE_PROVIDER_SITE_OTHER): Payer: Self-pay | Admitting: Pediatrics

## 2014-09-01 VITALS — BP 103/63 | HR 80 | Ht 63.78 in | Wt 145.4 lb

## 2014-09-01 DIAGNOSIS — R300 Dysuria: Secondary | ICD-10-CM

## 2014-09-01 DIAGNOSIS — N3000 Acute cystitis without hematuria: Secondary | ICD-10-CM

## 2014-09-01 LAB — POCT URINALYSIS DIPSTICK
BILIRUBIN UA: NEGATIVE
GLUCOSE UA: NEGATIVE
Ketones, UA: NEGATIVE
Nitrite, UA: NEGATIVE
PH UA: 5.5
Protein, UA: NEGATIVE
RBC UA: NEGATIVE
SPEC GRAV UA: 1.01
UROBILINOGEN UA: NEGATIVE

## 2014-09-01 LAB — GC/CHLAMYDIA PROBE AMP
CT Probe RNA: NEGATIVE
GC Probe RNA: NEGATIVE

## 2014-09-01 MED ORDER — NITROFURANTOIN MONOHYD MACRO 100 MG PO CAPS: 100.0000 mg | ORAL_CAPSULE | Freq: Two times a day (BID) | ORAL | Status: DC

## 2014-09-01 NOTE — Progress Notes (Unsigned)
THIS RECORD MAY CONTAIN CONFIDENTIAL INFORMATION THAT SHOULD NOT BE RELEASED WITHOUT REVIEW OF THE SERVICE Jamie Lutz.  Adolescent Medicine Consultation Follow-Up Visit Jamie Lutz  is a 19 y.o. female referred by No ref. Jamie Lutz found here today for follow-up of ***.    Previsit planning completed:  {YES/NO/NOT APPLICABLE:20182}  Pre-Visit Planning  Jamie Lutz  is a 19 y.o. female referred by Jamie Peru, MD.   Last seen [facility] on 08/30/2014 for dysuria and PID.   Previous Psych Screenings?  n/a  Treatment plan at last visit included cultures sent and advised treatment for PID.   Clinical Staff Visit Tasks:   - Urine GC/CT due? no - Psych Screenings Due? n/a - Clean catch urine - Have patient prepped for repeat bimanual exam  Jamie Lutz Visit Tasks: - Assess symptoms and bimanual exam - Pertinent Labs? yes,  Component     Latest Ref Rng 08/30/2014  Candida species     Negative NEG  Trichomonas vaginosis     Negative NEG  Gardnerella vaginalis     Negative NEG   GC/CT PENDING URINE CULTURE PENDING   Growth Chart Viewed? no   History was provided by the patient.  PCP Confirmed?  yes  My Chart Activated?   {YES NO:22349}   HPI:   Slight improvement pain Still having a little burning, still having some midline burning when she urinates.   Patient's last menstrual period was 08/04/2014. No Known Allergies   Medication List       This list is accurate as of: 09/01/14  1:35 PM.  Always use your most recent med list.               doxycycline 100 MG capsule  Commonly known as:  VIBRAMYCIN  Take 1 capsule (100 mg total) by mouth 2 (two) times daily.     metroNIDAZOLE 500 MG tablet  Commonly known as:  FLAGYL  Take 1 tablet (500 mg total) by mouth 2 (two) times daily.        Social History: School:  {Misc; school status:18689} Nutrition/Eating Behaviors:  *** Exercise:  {Exercise:23478} Sleep:  {SX; SLEEP  PATTERNS:18802}  Confidentiality was discussed with the patient and if applicable, with caregiver as well.  Patient's personal or confidential phone number: *** Tobacco?  {YES/NO/WILD ZOXWR:60454} Drugs/ETOH?  {YES/NO/WILD UJWJX:91478} Partner preference?  {CHL AMB PARTNER PREFERENCE:[id number]} Sexually Active?  {YES [id number]   Pregnancy Prevention:  {Pregnancy [phone]}, reviewed condoms & plan B Safe at home, in school & in relationships?  {Yes or If no, why not?:20788} Safe to self?  {Yes or If no, why not?:20788}  Guns in the home?  {YES/NO/WILD GNFAO:13086}  {Common ambulatory SmartLinks:19316}  Physical Exam:  There were no vitals filed for this visit. LMP 08/04/2014 Body mass index: body mass index is unknown because there is no weight on file. Facility age limit for growth percentiles is 20 years.  Physical Exam   Assessment/Plan: ***  Follow-up:  No Follow-up on file.   Medical decision-making:  > *** minutes spent, more than 50% of appointment was spent discussing diagnosis and management of symptoms

## 2014-09-01 NOTE — Progress Notes (Signed)
THIS RECORD MAY CONTAIN CONFIDENTIAL INFORMATION THAT SHOULD NOT BE RELEASED WITHOUT REVIEW OF THE SERVICE PROVIDER.  Adolescent Medicine Consultation Follow-Up Visit Jamie Lutz  is a 19 y.o. female referred by Jonetta Osgood, MD here today for follow-up of PID.    Previsit planning completed:  yes Pre-Visit Planning  Jamie Lutz  is a 19 y.o. female referred by Dory Peru, MD.   Last seen in [facility] on 08/30/2014 for dysuria and PID.   Previous Psych Screenings?  n/a  Treatment plan at last visit included cultures sent and advised treatment for PID.   Clinical Staff Visit Tasks:   - Urine GC/CT due? no - Psych Screenings Due? n/a - Clean catch urine - Have patient prepped for repeat bimanual exam  Provider Visit Tasks: - Assess symptoms and bimanual exam - Pertinent Labs? yes,  Component     Latest Ref Rng 08/30/2014  Candida species     Negative NEG  Trichomonas vaginosis     Negative NEG  Gardnerella vaginalis     Negative NEG   GC/CT PENDING URINE CULTURE PENDING  Growth Chart Viewed? not applicable   History was provided by the patient.  PCP Confirmed?  yes  My Chart Activated?   yes   HPI:  Pt was able to pick up her medications but they were very expensive She reports her dysuria is somewhat improved.  Still has burning midsuprapubic area with urination, not every time and overall better since her last visit.  Patient's last menstrual period was 08/04/2014. No Known Allergies   Medication List       This list is accurate as of: 09/01/14 11:59 PM.  Always use your most recent med list.               nitrofurantoin (macrocrystal-monohydrate) 100 MG capsule  Commonly known as:  MACROBID  Take 1 capsule (100 mg total) by mouth 2 (two) times daily.        Physical Exam:  Filed Vitals:   09/01/14 1317  BP: 103/63  Pulse: 80  Height: 5' 3.78" (1.62 m)  Weight: 145 lb 6.4 oz (65.953 kg)   BP 103/63 mmHg   Pulse 80  Ht 5' 3.78" (1.62 m)  Wt 145 lb 6.4 oz (65.953 kg)  BMI 25.13 kg/m2  LMP 08/04/2014 Body mass index: body mass index is 25.13 kg/(m^2). Facility age limit for growth percentiles is 20 years.  Physical Exam  Constitutional: She appears well-nourished. No distress.  Abdominal: Soft. Tenderness: mild sided abdominal tenderness, mild suprapublic tenderness.  No flank tenderness   Results for orders placed or performed in visit on 09/01/14  POCT urinalysis dipstick  Result Value Ref Range   Color, UA yellow    Clarity, UA clear    Glucose, UA neg    Bilirubin, UA neg    Ketones, UA neg    Spec Grav, UA 1.010    Blood, UA neg    pH, UA 5.5    Protein, UA neg    Urobilinogen, UA negative    Nitrite, UA neg    Leukocytes, UA Trace (A) Negative     Assessment/Plan: 19 yo female here for f/u of suspected PID.  Initiation of treatment 2 days ago, symptoms somewhat improved.  Urine culture is positive for ecoli.  Will change to treatment for UTI. 1. Acute cystitis without hematuria - nitrofurantoin, macrocrystal-monohydrate, (MACROBID) 100 MG capsule; Take 1 capsule (100 mg total) by mouth 2 (two) times daily.  Dispense: 10 capsule; Refill: 0  2. Dysuria - POCT urinalysis dipstick   Follow-up:  Return if symptoms worsen or fail to improve.   Medical decision-making:  > 15 minutes spent, more than 50% of appointment was spent discussing diagnosis and management of symptoms

## 2014-09-01 NOTE — Patient Instructions (Signed)

## 2014-09-02 LAB — URINE CULTURE

## 2014-09-04 ENCOUNTER — Ambulatory Visit: Payer: No Typology Code available for payment source | Admitting: Physical Therapy

## 2014-09-04 ENCOUNTER — Encounter: Payer: Self-pay | Admitting: Physical Therapy

## 2014-09-04 DIAGNOSIS — M25552 Pain in left hip: Secondary | ICD-10-CM

## 2014-09-04 DIAGNOSIS — R102 Pelvic and perineal pain: Secondary | ICD-10-CM

## 2014-09-04 DIAGNOSIS — R1031 Right lower quadrant pain: Secondary | ICD-10-CM

## 2014-09-04 DIAGNOSIS — M6289 Other specified disorders of muscle: Secondary | ICD-10-CM

## 2014-09-04 DIAGNOSIS — N949 Unspecified condition associated with female genital organs and menstrual cycle: Secondary | ICD-10-CM

## 2014-09-04 DIAGNOSIS — R1032 Left lower quadrant pain: Principal | ICD-10-CM

## 2014-09-04 NOTE — Therapy (Signed)
[facility] [address] [place], [place], [address] Phone: [phone]   Fax:  [phone]  Physical Therapy Treatment  Patient Details  Name: Jamie Lutz MRN: [id number] Date of Birth: 1995/06/09 Referring Provider:  Gaspar Skeeters, MD  Encounter Date: 09/04/2014      PT End of Session - 09/04/14 1220    Visit Number 9   Number of Visits 12   Date for PT Re-Evaluation 10/16/14   PT Start Time 6237   PT Stop Time 1225   PT Time Calculation (min) 40 min   Activity Tolerance Patient tolerated treatment well   Behavior During Therapy [name] for tasks assessed/performed      Past Medical History  Diagnosis Date  . No pertinent past medical history     Past Surgical History  Procedure Laterality Date  . No past surgeries    . Perineal laceration repair  04/17/2011    Procedure: SUTURE REPAIR PERINEAL LACERATION;  Surgeon: Jonnie Kind, MD;  Location: [facility];  Service: Gynecology;  Laterality: N/A;    There were no vitals filed for this visit.  Visit Diagnosis:  Abdominal pain, bilateral lower quadrant  Pain in the hip, left  Muscle tightness  Perineal pain in female      Subjective Assessment - 09/04/14 1151    Subjective Things are going well.  I saw MD last friday and I have UTI. Constipation is 80% better.    Patient is accompained by: Family member   Pertinent History pregnancies 1st 04/16/11 and 2nd 06/11/12   Limitations Lifting;Sitting   How long can you sit comfortably? usually uncomfortable   How long can you stand comfortably? does not affect   How long can you walk comfortably? does not affect in fact may relieve to a degree   Diagnostic tests XR, [place] without (neg)   Patient Stated Goals would like to have less discomfort, anxiety and not have to go to the bathroom all day (every 30 min)   Currently in Pain? Yes   Pain Score 4    Pain Location Pelvis   Pain Orientation Lower   Pain Descriptors / Indicators Sharp   Pain Type Chronic pain   Pain Onset More than a month ago   Pain Frequency Constant   Aggravating Factors  Not sure   Pain Relieving Factors corset   Effect of Pain on Daily Activities cannot left or carry kids   Multiple Pain Sites No            [facility] PT Assessment - 09/04/14 0001    Strength   Right Hip ABduction 4/5   Palpation   Palpation comment pelvis in correct alignment.                   Pelvic Floor Special Questions - 09/04/14 0001    Diastasis Recti 1/2 finger   Marinoff Scale discomfort that does not affect completion           [facility] Adult PT Treatment/Exercise - 09/04/14 0001    Lumbar Exercises: Aerobic   UBE (Upper Arm Bike) Sitting on green ball L1 3x3   Manual Therapy   Manual Therapy Soft tissue mobilization;Myofascial release   Soft tissue mobilization left psoas   Myofascial Release left lower quadrant                PT Education - 09/04/14 1204    Education provided Yes   Education Details Hip  abduction and adduction in sidely   Person(s) Educated Patient   Methods Explanation;Tactile cues;Verbal cues;Handout;Demonstration   Comprehension Returned demonstration;Verbalized understanding          PT Short Term Goals - 08/14/14 1108    PT SHORT TERM GOAL #1   Title Patient will be i with initial HEP   Time 4   Period Weeks   Status Achieved   PT SHORT TERM GOAL #2   Title Pt will be able to sit 25% more comfortably for meals, riding in car   Time 4   Period Weeks   Status Achieved  25%   PT SHORT TERM GOAL #3   Title Pt will be able to lift, bathe kids with min pain in LLQ   Time 4   Period Weeks   Status Achieved   PT SHORT TERM GOAL #4   Title Pt will be i with concepts of RICE, stretching, posture   Time 4   Period Weeks   Status Achieved  Pt able to verbalize today througout tx           PT Long Term Goals - 09/04/14 1155    PT LONG TERM GOAL #1   Title Pt  will be I with more advanced HEP, self care   Time 8   Period Weeks   Status On-going  Learning exercises   PT LONG TERM GOAL #2   Title Pt will be able to report pain <2/10 with ADLs and mobility    Period Weeks   Status On-going  4-5/10   PT LONG TERM GOAL #3   Title Pt will be able to sit as needed without pain in LLQ   Time 8   Period Weeks   Status On-going  while driving   PT LONG TERM GOAL #4   Title pain with intercourse decreased >/= 75%   Time 8   Period Weeks   Status On-going  No change   PT LONG TERM GOAL #5   Title abdominal strength 4/5 with reduction of diastasis Recti to 1 finger width to assist in reduction in constipation and decreased urgency with urine   Time 8   Period Weeks   Status On-going  3/5 strength               Plan - 09/04/14 1220    Clinical Impression Statement Patient is a 19 year old female with diagnosis of abdominal pain.  Patient has a UTI and is taking an antibiotic.  Patient diastasis is 1/2 finger width.  Abdominal strength is 3/5.  Bilateral hip strenght is 4/5.  Patient is able to lift her kids with 50% greater ease.  Patiet is able to have intercourse with pain.  Patient able to perform home tasks with pain level 4/10.  Patient has not met goals since last visit but is working toward them by increasing her stength and working on her pain.  Patient will benenfit from physical therapy to reduce pain and improve strength.    Pt will benefit from skilled therapeutic intervention in order to improve on the following deficits Decreased range of motion;Increased fascial restricitons;Dizziness;Decreased activity tolerance;Pain;Impaired flexibility;Decreased balance;Decreased mobility;Decreased strength;Increased muscle spasms   Rehab Potential Good   Clinical Impairments Affecting Rehab Potential None   PT Frequency 2x / week   PT Duration 8 weeks   PT Treatment/Interventions ADLs/Self Care Home Management;Ultrasound;Neuromuscular  re-education;Passive range of motion;Functional mobility training;Iontophoresis 8m/ml Dexamethasone;Moist Heat;Therapeutic exercise;Manual techniques;Therapeutic activities;Cryotherapy;Electrical Stimulation;Dry needling;Balance training;Biofeedback;Patient/family education  PT Next Visit Plan Core, hip strength, conditioning   PT Home Exercise Plan progress as needed   Consulted and Agree with Plan of Care Patient   Family Member Consulted none        Problem List Patient Active Problem List   Diagnosis Date Noted  . 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 Chronic abdominal pain 05/03/2014  . Constipation 05/03/2014  . Paresthesia 02/06/2014  . Teenage mother 02/03/2014    GRAY,CHERYL,PT 09/04/2014, 12:27 PM  [facility] [address], [place], [place], [address] Phone: [phone]   Fax:  [phone]

## 2014-09-04 NOTE — Patient Instructions (Signed)
Strengthening: Hip Abduction (Side-Lying)   Tighten muscles on front of left thigh, then lift leg _6___ inches from surface, keeping knee locked.  Repeat _10___ times per set. Do __1__ sets per session. Do _1___ sessions per day.  http://orth.exer.us/622   Copyright  VHI. All rights reserved.  Strengthening: Hip Adduction (Side-Lying)   Tighten muscles on front of right thigh, then lift leg _2___ inches from surface, keeping knee locked.  Repeat _10___ times per set. Do __1__ sets per session. Do __1__ sessions per day.  http://orth.exer.us/624   Copyright  VHI. All rights reserved.  [facility] [address] [place], [place] [address] Phone # [phone] Fax [phone]

## 2014-09-06 ENCOUNTER — Ambulatory Visit: Payer: No Typology Code available for payment source | Admitting: Physical Therapy

## 2014-09-06 ENCOUNTER — Encounter: Payer: Self-pay | Admitting: Physical Therapy

## 2014-09-06 DIAGNOSIS — M6289 Other specified disorders of muscle: Secondary | ICD-10-CM

## 2014-09-06 DIAGNOSIS — R1032 Left lower quadrant pain: Principal | ICD-10-CM

## 2014-09-06 DIAGNOSIS — N949 Unspecified condition associated with female genital organs and menstrual cycle: Secondary | ICD-10-CM

## 2014-09-06 DIAGNOSIS — M25552 Pain in left hip: Secondary | ICD-10-CM

## 2014-09-06 DIAGNOSIS — R102 Pelvic and perineal pain: Secondary | ICD-10-CM

## 2014-09-06 DIAGNOSIS — R1031 Right lower quadrant pain: Secondary | ICD-10-CM

## 2014-09-06 NOTE — Therapy (Signed)
[facility] [address], [address] [place], [place], [address] Phone: [phone]   Fax:  [phone]  Physical Therapy Treatment  Patient Details  Name: Jamie Lutz MRN: [id number] Date of Birth: 12/27/1995 Referring Provider:  Owens Shark, MD  Encounter Date: 09/06/2014      PT End of Session - 09/06/14 1238    Visit Number 10   Number of Visits 12   Date for PT Re-Evaluation 10/16/14   PT Start Time 1235   PT Stop Time 1313   PT Time Calculation (min) 38 min   Activity Tolerance Patient tolerated treatment well   Behavior During Therapy [facility] for tasks assessed/performed      Past Medical History  Diagnosis Date  . No pertinent past medical history     Past Surgical History  Procedure Laterality Date  . No past surgeries    . Perineal laceration repair  04/17/2011    Procedure: SUTURE REPAIR PERINEAL LACERATION;  Surgeon: Tilda Burrow, MD;  Location: [facility];  Service: Gynecology;  Laterality: N/A;    There were no vitals filed for this visit.  Visit Diagnosis:  Abdominal pain, bilateral lower quadrant  Pain in the hip, left  Muscle tightness  Perineal pain in female      Subjective Assessment - 09/06/14 1239    Subjective I feel better from last session.  I had less pain.  My back did hurt.  Later that night my left hip popped.    Patient is accompained by: Family member   Pertinent History pregnancies 1st 04/16/11 and 2nd 06/11/12   Limitations Lifting;Sitting   How long can you sit comfortably? usually uncomfortable   How long can you stand comfortably? does not affect   How long can you walk comfortably? does not affect in fact may relieve to a degree   Diagnostic tests XR, [place] without (neg)   Patient Stated Goals would like to have less discomfort, anxiety and not have to go to the bathroom all day (every 30 min)   Currently in Pain? Yes   Pain Score 3    Pain Location Abdomen   Pain  Orientation Left   Pain Descriptors / Indicators Dull   Pain Type Chronic pain   Pain Onset More than a month ago   Pain Frequency Constant   Aggravating Factors  sitting   Pain Relieving Factors corset   Effect of Pain on Daily Activities cannot lift or carry kids   Multiple Pain Sites No                         [facility] Adult PT Treatment/Exercise - 09/06/14 0001    Lumbar Exercises: Aerobic   UBE (Upper Arm Bike) Sitting on green ball L1 3x3   Knee/Hip Exercises: Stretches   Other Knee/Hip Stretches sitting hip adductor stretch hold 30 sec 2 times   Modalities   Modalities Ultrasound   Ultrasound   Ultrasound Location left lower quadrant   Ultrasound Parameters 100%, 1 mhz, 1.2 w/cm2, 8 min   Ultrasound Goals Pain   Manual Therapy   Manual Therapy Soft tissue mobilization;Myofascial release   Soft tissue mobilization left psoas; left lower quadrant, left hip adductors                PT Education - 09/06/14 1311    Education provided Yes   Education Details long sit hip adductor stretch   Person(s) Educated Patient  Methods Explanation;Demonstration   Comprehension Verbalized understanding;Returned demonstration          PT Short Term Goals - 08/14/14 1108    PT SHORT TERM GOAL #1   Title Patient will be i with initial HEP   Time 4   Period Weeks   Status Achieved   PT SHORT TERM GOAL #2   Title Pt will be able to sit 25% more comfortably for meals, riding in car   Time 4   Period Weeks   Status Achieved  25%   PT SHORT TERM GOAL #3   Title Pt will be able to lift, bathe kids with min pain in LLQ   Time 4   Period Weeks   Status Achieved   PT SHORT TERM GOAL #4   Title Pt will be i with concepts of RICE, stretching, posture   Time 4   Period Weeks   Status Achieved  Pt able to verbalize today througout tx           PT Long Term Goals - 09/06/14 1312    PT LONG TERM GOAL #1   Title Pt will be I with more advanced HEP, self  care   Time 8   Period Weeks   Status On-going  still learning   PT LONG TERM GOAL #2   Title Pt will be able to report pain <2/10 with ADLs and mobility    Time 8   Period Weeks   Status On-going  5/10   PT LONG TERM GOAL #3   Title Pt will be able to sit as needed without pain in LLQ   Time 8   Period Weeks   Status On-going  5/10   PT LONG TERM GOAL #4   Title pain with intercourse decreased >/= 75%   Time 8   Period Weeks   Status On-going   PT LONG TERM GOAL #5   Title abdominal strength 4/5 with reduction of diastasis Recti to 1 finger width to assist in reduction in constipation and decreased urgency with urine   Time 8   Period Weeks   Status On-going  3/5               Plan - 09/06/14 1313    Clinical Impression Statement Patient is a 19 year old female with diagnosis of abdominal pain.  Patient has no tenderness along the pelvic floor externally for first time. Pelvis is in correct alignment.  Patient has tightness in bil. hip adductors. Ultrasound has decreased pain.  Patient will benefit from physical therapy to reduce pain and improve core strength.    Pt will benefit from skilled therapeutic intervention in order to improve on the following deficits Decreased range of motion;Increased fascial restricitons;Dizziness;Decreased activity tolerance;Pain;Impaired flexibility;Decreased balance;Decreased mobility;Decreased strength;Increased muscle spasms   Clinical Impairments Affecting Rehab Potential None   PT Frequency 2x / week   PT Duration 8 weeks   PT Treatment/Interventions ADLs/Self Care Home Management;Ultrasound;Neuromuscular re-education;Passive range of motion;Functional mobility training;Iontophoresis 4mg /ml Dexamethasone;Moist Heat;Therapeutic exercise;Manual techniques;Therapeutic activities;Cryotherapy;Electrical Stimulation;Dry needling;Balance training;Biofeedback;Patient/family education   PT Next Visit Plan Core, hip strength, conditioning    PT Home Exercise Plan progress as needed   Consulted and Agree with Plan of Care Patient   Family Member Consulted none        Problem List Patient Active Problem List   Diagnosis Date Noted  . 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 Chronic abdominal pain 05/03/2014  . Constipation 05/03/2014  . Paresthesia  02/06/2014  . Teenage mother 02/03/2014    Kale Rondeau,PT 09/06/2014, 1:17 PM  [facility] [address], [address] [place], [place], [address] Phone: [phone]   Fax:  [phone]

## 2014-09-08 ENCOUNTER — Encounter: Payer: Self-pay | Admitting: Pediatrics

## 2014-09-11 ENCOUNTER — Ambulatory Visit: Payer: No Typology Code available for payment source | Admitting: Physical Therapy

## 2014-09-11 ENCOUNTER — Telehealth: Payer: Self-pay | Admitting: *Deleted

## 2014-09-11 ENCOUNTER — Encounter: Payer: Self-pay | Admitting: Physical Therapy

## 2014-09-11 DIAGNOSIS — R1032 Left lower quadrant pain: Principal | ICD-10-CM

## 2014-09-11 DIAGNOSIS — R1031 Right lower quadrant pain: Secondary | ICD-10-CM

## 2014-09-11 DIAGNOSIS — M25552 Pain in left hip: Secondary | ICD-10-CM

## 2014-09-11 DIAGNOSIS — M6289 Other specified disorders of muscle: Secondary | ICD-10-CM

## 2014-09-11 DIAGNOSIS — N949 Unspecified condition associated with female genital organs and menstrual cycle: Secondary | ICD-10-CM

## 2014-09-11 DIAGNOSIS — R102 Pelvic and perineal pain: Secondary | ICD-10-CM

## 2014-09-11 NOTE — Telephone Encounter (Signed)
Per [facility], GC swab resulted was collected and sent. Resulted as negative on 8/11.

## 2014-09-11 NOTE — Telephone Encounter (Signed)
-----   Message from Owens Shark, MD sent at 09/08/2014 11:38 AM EDT ----- Please find out whether this was not run or just not in the system.  Thanks.   ----- Message -----    From: SYSTEM    Sent: 09/04/2014  12:04 AM      To: Owens Shark, MD

## 2014-09-11 NOTE — Therapy (Signed)
[facility] [address], [address] [place], [place], [address] Phone: [phone]   Fax:  [phone]  Physical Therapy Treatment  Patient Details  Name: Jamie Lutz MRN: [id number] Date of Birth: 25-Mar-1995 Referring Provider:  Owens Shark, MD  Encounter Date: 09/11/2014      PT End of Session - 09/11/14 1123    Visit Number 11   Number of Visits 12   Date for PT Re-Evaluation 10/16/14   PT Start Time 1105   PT Stop Time 1145   PT Time Calculation (min) 40 min   Activity Tolerance Patient tolerated treatment well   Behavior During Therapy [facility] for tasks assessed/performed      Past Medical History  Diagnosis Date  . No pertinent past medical history     Past Surgical History  Procedure Laterality Date  . No past surgeries    . Perineal laceration repair  04/17/2011    Procedure: SUTURE REPAIR PERINEAL LACERATION;  Surgeon: Tilda Burrow, MD;  Location: [facility];  Service: Gynecology;  Laterality: N/A;    There were no vitals filed for this visit.  Visit Diagnosis:  Abdominal pain, bilateral lower quadrant  Pain in the hip, left  Muscle tightness  Perineal pain in female      Subjective Assessment - 09/11/14 1108    Subjective Feeling ok, no pain today. Got antibiotics for UTI and that helped my anterior lower ab pain.   Currently in Pain? No/denies   Multiple Pain Sites No                         [facility] Adult PT Treatment/Exercise - 09/11/14 0001    Lumbar Exercises: Aerobic   Stationary Bike Nustep L3 x 7 min   UBE (Upper Arm Bike) Sitting on green ball L1 3x3   Lumbar Exercises: Supine   Other Supine Lumbar Exercises Soft foam roll SI shear, post leg stretch, then supine TA work   TA work: Contraction, marching, angle arms. 10x eac   Knee/Hip Exercises: Stretches   Other Knee/Hip Stretches prone release on soft foam roll for hip flexors, RF   Other Knee/Hip Stretches  Supine hip flexor/RF stretch on soft foam roll bil 3x                   PT Short Term Goals - 08/14/14 1108    PT SHORT TERM GOAL #1   Title Patient will be i with initial HEP   Time 4   Period Weeks   Status Achieved   PT SHORT TERM GOAL #2   Title Pt will be able to sit 25% more comfortably for meals, riding in car   Time 4   Period Weeks   Status Achieved  25%   PT SHORT TERM GOAL #3   Title Pt will be able to lift, bathe kids with min pain in LLQ   Time 4   Period Weeks   Status Achieved   PT SHORT TERM GOAL #4   Title Pt will be i with concepts of RICE, stretching, posture   Time 4   Period Weeks   Status Achieved  Pt able to verbalize today througout tx           PT Long Term Goals - 09/11/14 1112    PT LONG TERM GOAL #1   Title Pt will be I with more advanced HEP, self care   Time 8  Period Weeks   Status On-going   PT LONG TERM GOAL #2   Title Pt will be able to report pain <2/10 with ADLs and mobility    Time 8   Period Weeks   Status On-going  3/10   PT LONG TERM GOAL #3   Title Pt will be able to sit as needed without pain in LLQ   Time 8   Period Weeks   Status On-going  less and less weekly               Plan - 09/11/14 1121    Clinical Impression Statement Pain is now down to 3/10 level fairly consistently, meeting that LTG. Continue to work on fascial restrictions today with foam roll and strengthen the core.    Pt will benefit from skilled therapeutic intervention in order to improve on the following deficits Decreased range of motion;Increased fascial restricitons;Dizziness;Decreased activity tolerance;Pain;Impaired flexibility;Decreased balance;Decreased mobility;Decreased strength;Increased muscle spasms   Rehab Potential Good   Clinical Impairments Affecting Rehab Potential None   PT Frequency 2x / week   PT Duration 8 weeks   PT Treatment/Interventions ADLs/Self Care Home Management;Ultrasound;Neuromuscular  re-education;Passive range of motion;Functional mobility training;Iontophoresis /ml Dexamethasone;Moist Heat;Therapeutic exercise;Manual techniques;Therapeutic activities;Cryotherapy;Electrical Stimulation;Dry needling;Balance training;Biofeedback;Patient/family education   PT Next Visit Plan Core, hip strength, conditioning   Consulted and Agree with Plan of Care Patient        Problem List Patient Active Problem List   Diagnosis Date Noted  . 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 Chronic abdominal pain 05/03/2014  . Constipation 05/03/2014  . Paresthesia 02/06/2014  . Teenage mother 02/03/2014    Ane Payment, PTA 09/11/2014, 11:38 AM  [facility] [address], [address] [place], [place], [address] Phone: [phone]   Fax:  [phone]

## 2014-09-13 ENCOUNTER — Encounter: Payer: Self-pay | Admitting: Physical Therapy

## 2014-09-13 ENCOUNTER — Ambulatory Visit: Payer: No Typology Code available for payment source | Admitting: Physical Therapy

## 2014-09-13 DIAGNOSIS — R1032 Left lower quadrant pain: Principal | ICD-10-CM

## 2014-09-13 DIAGNOSIS — M6289 Other specified disorders of muscle: Secondary | ICD-10-CM

## 2014-09-13 DIAGNOSIS — M25552 Pain in left hip: Secondary | ICD-10-CM

## 2014-09-13 DIAGNOSIS — R1031 Right lower quadrant pain: Secondary | ICD-10-CM

## 2014-09-13 DIAGNOSIS — R102 Pelvic and perineal pain: Secondary | ICD-10-CM

## 2014-09-13 DIAGNOSIS — N949 Unspecified condition associated with female genital organs and menstrual cycle: Secondary | ICD-10-CM

## 2014-09-13 NOTE — Patient Instructions (Signed)
Iliotibial Band Stretch, Standing   Stand, hands on hips, one leg crossed in front of other leg. Lean to same side as front leg until stretch is felt on other hip. Hold 30___ seconds. Change foot position and lean to same side. Hold _30__ seconds. Repeat _2__ times per session. Do __1_ sessions per day.  Copyright  VHI. All rights reserved.  Stretching: Hip Flexor   Kneeling on right knee, slowly push pelvis down while slightly arching back until stretch is felt on front of hip. Hold __30__ seconds. Repeat _2___ times per set. Do __1_ sets per session. Do __1__ sessions per day.  http://orth.exer.us/648   Copyright  VHI. All rights reserved.  Stretching: Hamstring (Standing)   Place right fo_30__ seconds. Repeat __2__ times per set. Do __1__ sets per session. Do __1__ sessions per day.  http://orth.exer.us/658   Copyright  VHI. All rights reserved.  Stretching: Gastroc   Stand with right foot back, leg straight, forward leg bent. Keeping heel on floor, turned slightly out, lean into wall until stretch is felt in calf. Hold __30__ seconds. Repeat __2__ times per set. Do _1___ sets per session. Do __1__ sessions per day.  http://orth.exer.us/662   Copyright  VHI. All rights reserved.  [facility] [address] [place], [place] [address] Phone # [phone] Fax [phone]

## 2014-09-13 NOTE — Therapy (Signed)
[facility] [address] [place], [place], [address] Phone: [phone]   Fax:  [phone]  Physical Therapy Treatment  Patient Details  Name: Jamie Lutz MRN: [id number] Date of Birth: Nov 09, 1995 Referring Provider:  Gaspar Skeeters, MD  Encounter Date: 09/13/2014      PT End of Session - 09/13/14 1152    Visit Number 12   Date for PT Re-Evaluation 10/16/14   PT Start Time 1145   PT Stop Time 1225   PT Time Calculation (min) 40 min   Activity Tolerance Patient tolerated treatment well   Behavior During Therapy [facility] for tasks assessed/performed      Past Medical History  Diagnosis Date  . No pertinent past medical history     Past Surgical History  Procedure Laterality Date  . No past surgeries    . Perineal laceration repair  04/17/2011    Procedure: SUTURE REPAIR PERINEAL LACERATION;  Surgeon: Jonnie Kind, MD;  Location: [facility];  Service: Gynecology;  Laterality: N/A;    There were no vitals filed for this visit.  Visit Diagnosis:  Abdominal pain, bilateral lower quadrant  Pain in the hip, left  Muscle tightness  Perineal pain in female      Subjective Assessment - 09/13/14 1153    Subjective I feel good.  I have cramps due to having cycle. Patient reports pain is 50% better since initial evaluation.    Pertinent History pregnancies 1st 04/16/11 and 2nd 06/11/12   Limitations Lifting;Sitting   How long can you sit comfortably? 20 min.    How long can you stand comfortably? no difficulty   How long can you walk comfortably? pain in left hip   Patient Stated Goals would like to have less discomfort, anxiety and not have to go to the bathroom all day (every 30 min)   Currently in Pain? Yes   Pain Score 2    Pain Location Hip   Pain Orientation Left   Pain Descriptors / Indicators Aching   Pain Type Chronic pain   Pain Onset More than a month ago   Pain Frequency Intermittent   Aggravating Factors  sitting   Pain Relieving Factors exercise, stretch, laying down   Multiple Pain Sites No            [facility] PT Assessment - 09/13/14 0001    Strength   Right Hip Flexion 5/5   Right Hip Extension 5/5   Right Hip ABduction 4/5   Left Hip Flexion 5/5   Left Hip Extension 5/5   Left Hip ABduction 4/5   Palpation   SI assessment  pelvis in correct alignment                     [facility] Adult PT Treatment/Exercise - 09/13/14 0001    Lumbar Exercises: Aerobic   Elliptical level 1 6 min.    Lumbar Exercises: Supine   Other Supine Lumbar Exercises Soft foam roll SI shear, post leg stretch, then supine TA work   TA work: Contraction, marching, angle arms. 10x eac   Knee/Hip Exercises: Standing   Hip ADduction Strengthening;Both;10 reps   Hip ADduction Limitations walking sideways with red band around knees   Lateral Step Up Right;Left;10 reps;Hand Hold: 0;Step Height: 4"   Lateral Step Up Limitations working on hip abduction strength   Manual Therapy   Manual Therapy Soft tissue mobilization;Myofascial release   Soft tissue mobilization left psoas  PT Education - 09/13/14 1224    Education provided Yes   Education Details stretches   Person(s) Educated Patient   Methods Explanation;Demonstration;Tactile cues;Verbal cues   Comprehension Returned demonstration;Verbalized understanding          PT Short Term Goals - 08/14/14 1108    PT SHORT TERM GOAL #1   Title Patient will be i with initial HEP   Time 4   Period Weeks   Status Achieved   PT SHORT TERM GOAL #2   Title Pt will be able to sit 25% more comfortably for meals, riding in car   Time 4   Period Weeks   Status Achieved  25%   PT SHORT TERM GOAL #3   Title Pt will be able to lift, bathe kids with min pain in LLQ   Time 4   Period Weeks   Status Achieved   PT SHORT TERM GOAL #4   Title Pt will be i with concepts of RICE, stretching, posture   Time 4    Period Weeks   Status Achieved  Pt able to verbalize today througout tx           PT Long Term Goals - 09/13/14 1156    PT LONG TERM GOAL #1   Title Pt will be I with more advanced HEP, self care   Time 8   Period Weeks   Status On-going  still learning exercises   PT LONG TERM GOAL #2   Title Pt will be able to report pain <2/10 with ADLs and mobility    Time 8   Period Weeks   Status Achieved  2/10   PT LONG TERM GOAL #3   Title Pt will be able to sit as needed without pain in LLQ   Time 8   Period Weeks   Status Achieved   PT LONG TERM GOAL #4   Title pain with intercourse decreased >/= 75%   Time 8   Period Weeks   Status On-going   PT LONG TERM GOAL #5   Title abdominal strength 4/5 with reduction of diastasis Recti to 1 finger width to assist in reduction in constipation and decreased urgency with urine   Time 8   Period Weeks   Status On-going               Plan - 09/13/14 1231    Clinical Impression Statement Patient has met LTG # 1 and 2.  Patient reports pain decreased by 50% overall.  Bilateral hip flexion and extension has increased to 5/5.  Patient left hip pain decreased to 1/10 after therapy. Patient would benefit from physical therapy to decrease pelvic pain  and  increase strength.    Pt will benefit from skilled therapeutic intervention in order to improve on the following deficits Decreased range of motion;Increased fascial restricitons;Dizziness;Decreased activity tolerance;Pain;Impaired flexibility;Decreased balance;Decreased mobility;Decreased strength;Increased muscle spasms   Rehab Potential Good   Clinical Impairments Affecting Rehab Potential None   PT Frequency 2x / week   PT Duration 8 weeks   PT Treatment/Interventions ADLs/Self Care Home Management;Ultrasound;Neuromuscular re-education;Passive range of motion;Functional mobility training;Iontophoresis 4mg/ml Dexamethasone;Moist Heat;Therapeutic exercise;Manual techniques;Therapeutic  activities;Cryotherapy;Electrical Stimulation;Dry needling;Balance training;Biofeedback;Patient/family education   PT Next Visit Plan Core, hip strength, conditioning; internal soft tissue work   PT Home Exercise Plan progress as needed   Consulted and Agree with Plan of Care Patient   Family Member Consulted none        Problem List Patient Active Problem   List   Diagnosis Date Noted  . 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 Chronic abdominal pain 05/03/2014  . Constipation 05/03/2014  . Paresthesia 02/06/2014  . Teenage mother 02/03/2014    GRAY,CHERYL ,PT  09/13/2014, 12:35 PM  [facility] [address], [address] [place], [place], [address] Phone: [phone]   Fax:  [phone]      

## 2014-09-18 ENCOUNTER — Encounter: Payer: Self-pay | Admitting: Physical Therapy

## 2014-09-18 ENCOUNTER — Ambulatory Visit: Payer: No Typology Code available for payment source | Admitting: Physical Therapy

## 2014-09-18 ENCOUNTER — Telehealth: Payer: Self-pay | Admitting: *Deleted

## 2014-09-18 DIAGNOSIS — R1031 Right lower quadrant pain: Secondary | ICD-10-CM

## 2014-09-18 DIAGNOSIS — R1032 Left lower quadrant pain: Secondary | ICD-10-CM

## 2014-09-18 DIAGNOSIS — M6289 Other specified disorders of muscle: Secondary | ICD-10-CM

## 2014-09-18 DIAGNOSIS — M25552 Pain in left hip: Secondary | ICD-10-CM

## 2014-09-18 NOTE — Therapy (Signed)
[facility] [address], [address] [place], [place], [address] Phone: [phone]   Fax:  [phone]  Physical Therapy Treatment  Patient Details  Name: Jamie Lutz MRN: [id number] Date of Birth: [birth year] Referring Provider:  Jonetta Osgood, MD  Encounter Date: 09/18/2014      PT End of Session - 09/18/14 1256    Visit Number 13   Date for PT Re-Evaluation 10/16/14   PT Start Time 1233   PT Stop Time 1315   PT Time Calculation (min) 42 min   Activity Tolerance Patient tolerated treatment well   Behavior During Therapy [facility] for tasks assessed/performed      Past Medical History  Diagnosis Date  . No pertinent past medical history     Past Surgical History  Procedure Laterality Date  . No past surgeries    . Perineal laceration repair  04/17/2011    Procedure: SUTURE REPAIR PERINEAL LACERATION;  Surgeon: Tilda Burrow, MD;  Location: [facility];  Service: Gynecology;  Laterality: N/A;    There were no vitals filed for this visit.  Visit Diagnosis:  Muscle tightness  Abdominal pain, bilateral lower quadrant  Pain in the hip, left      Subjective Assessment - 09/18/14 1236    Subjective I did not feel good this weekend, reports it is "hard" to explain.   Currently in Pain? No/denies                         [facility] Adult PT Treatment/Exercise - 09/18/14 0001    Lumbar Exercises: Aerobic   Elliptical L2 x 5 min   UBE (Upper Arm Bike) sitting on green ball L2 4x4   Lumbar Exercises: Supine   Other Supine Lumbar Exercises Soft foam roll SI shear, post leg stretch, then supine TA work   TA work: Contraction, marching, angle arms, rocking 10x eac   Knee/Hip Exercises: Stretches   Other Knee/Hip Stretches Prone release on foam roll for psoas/RF                  PT Short Term Goals - 08/14/14 1108    PT SHORT TERM GOAL #1   Title Patient will be i with initial HEP   Time 4   Period Weeks   Status Achieved   PT SHORT TERM GOAL #2   Title Pt will be able to sit 25% more comfortably for meals, riding in car   Time 4   Period Weeks   Status Achieved  25%   PT SHORT TERM GOAL #3   Title Pt will be able to lift, bathe kids with min pain in LLQ   Time 4   Period Weeks   Status Achieved   PT SHORT TERM GOAL #4   Title Pt will be i with concepts of RICE, stretching, posture   Time 4   Period Weeks   Status Achieved  Pt able to verbalize today througout tx           PT Long Term Goals - 09/18/14 1301    PT LONG TERM GOAL #1   Title Pt will be I with more advanced HEP, self care   Time 8   Period Weeks   Status On-going   PT LONG TERM GOAL #2   Title Pt will be able to report pain <2/10 with ADLs and mobility    Time 8   Period Weeks   Status Achieved  PT LONG TERM GOAL #3   Title Pt will be able to sit as needed without pain in LLQ   Time 8   Period Weeks   Status Achieved   PT LONG TERM GOAL #4   Title pain with intercourse decreased >/= 75%   Time 8   Period Weeks   Status On-going   PT LONG TERM GOAL #5   Title abdominal strength 4/5 with reduction of diastasis Recti to 1 finger width to assist in reduction in constipation and decreased urgency with urine   Time 8   Period Weeks   Status On-going  will look at when she sees PT for internal work               Plan - 09/18/14 1256    Clinical Impression Statement No pain today, able to perform all fascial lengthening exercises with a deeper sense of relaxation and deeper awareness of her lower abdominals. Able to increase length of time sitting on ball for arm bike due to increased endurance of her trunk/core.    Pt will benefit from skilled therapeutic intervention in order to improve on the following deficits Decreased range of motion;Increased fascial restricitons;Dizziness;Decreased activity tolerance;Pain;Impaired flexibility;Decreased balance;Decreased mobility;Decreased  strength;Increased muscle spasms   Rehab Potential Good   Clinical Impairments Affecting Rehab Potential None   PT Frequency 2x / week   PT Duration 8 weeks   PT Treatment/Interventions ADLs/Self Care Home Management;Ultrasound;Neuromuscular re-education;Passive range of motion;Functional mobility training;Iontophoresis /ml Dexamethasone;Moist Heat;Therapeutic exercise;Manual techniques;Therapeutic activities;Cryotherapy;Electrical Stimulation;Dry needling;Balance training;Biofeedback;Patient/family education   PT Next Visit Plan Core, hip strength, conditioning continue to progress   Consulted and Agree with Plan of Care Patient        Problem List Patient Active Problem List   Diagnosis Date Noted  . 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 Chronic abdominal pain 05/03/2014  . Constipation 05/03/2014  . Paresthesia 02/06/2014  . Teenage mother 02/03/2014    Ane Payment, PTA 09/18/2014, 1:04 PM  [facility] [address] [place], [place], [address] Phone: [phone]   Fax:  [phone]

## 2014-09-18 NOTE — Telephone Encounter (Addendum)
Spoke with [employer] rep who could see the negative resulted GC/CT labs from 08/30/14. She was unable to state why they were not loaded in Epic, but did state that she "pushed them through" into Epic. After waiting several minutes, and refreshing screen as advised by tech, results are not currently visible. Will check again later today.

## 2014-09-18 NOTE — Telephone Encounter (Signed)
-----   Message from Owens Shark, MD sent at 09/15/2014 12:48 PM EDT ----- This is still not appearing in Epic!  I know it was negative but need to make sure the result is documented in there.  Can you try calling [facility] again? Thanks, m  ----- Message -----    From: SYSTEM    Sent: 09/04/2014  12:04 AM      To: Owens Shark, MD

## 2014-09-20 ENCOUNTER — Encounter: Payer: No Typology Code available for payment source | Admitting: Physical Therapy

## 2014-09-26 ENCOUNTER — Encounter: Payer: Self-pay | Admitting: Physical Therapy

## 2014-09-26 ENCOUNTER — Ambulatory Visit: Payer: Self-pay | Admitting: Pediatrics

## 2014-09-26 ENCOUNTER — Ambulatory Visit: Payer: No Typology Code available for payment source | Attending: Pediatrics | Admitting: Physical Therapy

## 2014-09-26 DIAGNOSIS — R102 Pelvic and perineal pain: Secondary | ICD-10-CM

## 2014-09-26 DIAGNOSIS — N949 Unspecified condition associated with female genital organs and menstrual cycle: Secondary | ICD-10-CM | POA: Insufficient documentation

## 2014-09-26 DIAGNOSIS — G729 Myopathy, unspecified: Secondary | ICD-10-CM | POA: Insufficient documentation

## 2014-09-26 DIAGNOSIS — R1031 Right lower quadrant pain: Secondary | ICD-10-CM | POA: Insufficient documentation

## 2014-09-26 DIAGNOSIS — R1032 Left lower quadrant pain: Secondary | ICD-10-CM | POA: Insufficient documentation

## 2014-09-26 DIAGNOSIS — M25552 Pain in left hip: Secondary | ICD-10-CM

## 2014-09-26 DIAGNOSIS — M6289 Other specified disorders of muscle: Secondary | ICD-10-CM

## 2014-09-26 NOTE — Therapy (Signed)
[facility] [address], [address] [place], [place], [address] Phone: [phone]   Fax:  [phone]  Physical Therapy Treatment  Patient Details  Name: Jamie Lutz MRN: [id number] Date of Birth: 14-Jun-1995 Referring Provider:  Owens Shark, MD  Encounter Date: 09/26/2014      PT End of Session - 09/26/14 1140    Visit Number 14   Date for PT Re-Evaluation 10/16/14   PT Start Time 1100   PT Stop Time 1140   PT Time Calculation (min) 40 min   Activity Tolerance Patient tolerated treatment well   Behavior During Therapy [facility] for tasks assessed/performed      Past Medical History  Diagnosis Date  . No pertinent past medical history     Past Surgical History  Procedure Laterality Date  . No past surgeries    . Perineal laceration repair  04/17/2011    Procedure: SUTURE REPAIR PERINEAL LACERATION;  Surgeon: Tilda Burrow, MD;  Location: [facility];  Service: Gynecology;  Laterality: N/A;    There were no vitals filed for this visit.  Visit Diagnosis:  Muscle tightness  Abdominal pain, bilateral lower quadrant  Pain in the hip, left  Perineal pain in female      Subjective Assessment - 09/26/14 1107    Subjective Patient reports 60% easier to lift kids when I do.  I feel 50% better overall. Patient reports no pain when she carries her children. Frequent urination since 3 years ago.    Patient is accompained by: Family member   Pertinent History pregnancies 1st 04/16/11 and 2nd 06/11/12   Limitations Lifting;Sitting   How long can you sit comfortably? 20 min.    How long can you stand comfortably? no difficulty   How long can you walk comfortably? pain in left hip   Diagnostic tests XR, [place] without (neg)   Patient Stated Goals would like to have less discomfort, anxiety and not have to go to the bathroom all day (every 30 min)   Currently in Pain? Yes   Pain Score 3    Pain Location Abdomen   Pain  Orientation Left   Pain Descriptors / Indicators Sharp   Pain Type Chronic pain   Pain Onset More than a month ago   Pain Frequency Intermittent   Aggravating Factors  sitting   Pain Relieving Factors exercise, stretch, laying down   Multiple Pain Sites No            [facility] PT Assessment - 09/26/14 0001    Strength   Right Hip ABduction 4/5   Left Hip ABduction 4/5   Palpation   SI assessment  pelvis in correct alignment                  Pelvic Floor Special Questions - 09/26/14 0001    Pelvic Floor Internal Exam Patient confirms identification and approves physical therapist to perform pelvic floor muscle assessment and integrity   Exam Type Vaginal   Palpation left obturator internist, levator ani, and bil. sides of urethra   Strength fair squeeze, definite lift           [facility] Adult PT Treatment/Exercise - 09/26/14 0001    Lumbar Exercises: Aerobic   Elliptical L2 x 5 min                PT Education - 09/26/14 1140    Education provided Yes   Education Details urge to void, diet with  bladder irrtants   Person(s) Educated Patient   Methods Explanation;Demonstration;Handout   Comprehension Verbalized understanding          PT Short Term Goals - 08/14/14 1108    PT SHORT TERM GOAL #1   Title Patient will be i with initial HEP   Time 4   Period Weeks   Status Achieved   PT SHORT TERM GOAL #2   Title Pt will be able to sit 25% more comfortably for meals, riding in car   Time 4   Period Weeks   Status Achieved  25%   PT SHORT TERM GOAL #3   Title Pt will be able to lift, bathe kids with min pain in LLQ   Time 4   Period Weeks   Status Achieved   PT SHORT TERM GOAL #4   Title Pt will be i with concepts of RICE, stretching, posture   Time 4   Period Weeks   Status Achieved  Pt able to verbalize today througout tx           PT Long Term Goals - 09/26/14 1116    PT LONG TERM GOAL #1   Title Pt will be I with more advanced HEP,  self care   Time 8   Period Weeks   Status On-going   PT LONG TERM GOAL #2   Title Pt will be able to report pain <2/10 with ADLs and mobility    Time 8   Period Weeks   Status Achieved   PT LONG TERM GOAL #3   Title Pt will be able to sit as needed without pain in LLQ   Time 8   Period Weeks   Status On-going  gets sharp pain in left side   PT LONG TERM GOAL #4   Title pain with intercourse decreased >/= 75%   Time 8   Status On-going  No intercourse   PT LONG TERM GOAL #5   Title abdominal strength 4/5 with reduction of diastasis Recti to 1 finger width to assist in reduction in constipation and decreased urgency with urine   Time 8   Period Weeks   Status On-going               Plan - 09/26/14 1141    Clinical Impression Statement Patient has a sharp pain in left side and is reproduced when therapist is palpating the left iliococcygeus or obturator internist.  Patient reports no pain with carrying her kids and is 60% easier.  Patient reports overall pain decreased by 50%. Patient pelvis in correct alignment and bil. hip abduction is 4/5.  pelvic floor strength has increased to 3/5. Patient would benefit from physical therapy to improve strength and reduce pain.    Pt will benefit from skilled therapeutic intervention in order to improve on the following deficits Decreased range of motion;Increased fascial restricitons;Dizziness;Decreased activity tolerance;Pain;Impaired flexibility;Decreased balance;Decreased mobility;Decreased strength;Increased muscle spasms   Rehab Potential Good   Clinical Impairments Affecting Rehab Potential None   PT Frequency 2x / week   PT Duration 8 weeks   PT Treatment/Interventions ADLs/Self Care Home Management;Ultrasound;Neuromuscular re-education;Passive range of motion;Functional mobility training;Iontophoresis /ml Dexamethasone;Moist Heat;Therapeutic exercise;Manual techniques;Therapeutic activities;Cryotherapy;Electrical  Stimulation;Dry needling;Balance training;Biofeedback;Patient/family education   PT Next Visit Plan hip abduction strength, internal soft tissue work   PT Home Exercise Plan progress as needed   Consulted and Agree with Plan of Care Patient   Family Member Consulted none        Problem  List Patient Active Problem List   Diagnosis Date Noted  . 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 Chronic abdominal pain 05/03/2014  . Constipation 05/03/2014  . Paresthesia 02/06/2014  . Teenage mother 02/03/2014    GRAY,CHERYL,PT 09/26/2014, 11:44 AM  [facility] [address], [address] [place], [place], [address] Phone: [phone]   Fax:  [phone]

## 2014-09-26 NOTE — Patient Instructions (Addendum)
Relaxation Exercises with the Urge to Void   When you experience an urge to void:  FIRST  Stop and stand very still    Sit down if you can    Don't move    You need to stay very still to maintain control  SECOND Squeeze your pelvic floor muscles 5 times, like a quick flick, to keep from leaking  THIRD Relax  Take a deep breath and then let it out  Try to make the urge go away by using relaxation and visualization techniques  FINALLY When you feel the urge go away somewhat, walk normally to the bathroom.   WAIT EVERY HOUR TO URINATE  If the urge gets suddenly stronger on the way, you may stop again and relax to regain control.   Certain foods and liquids will decrease the pH making the urine more acidic.  Urinary urgency increases when the urine has a low pH.  Most common irritants: alcohol, carbonated beverages and caffinated beverages.  Foods to avoid: apple juice, apples, ascorbic acid, canteloupes, chili, citrus fruits, coffee, cranberries, grapes, guava, peaches, pepper, pineapple, plums, strawberries, tea, tomatoes, and vinegar.  Drinking plenty of water may help to increase the pH and dilute out any of the effects of specific irritants.  Foods that are NOT irritating to the bladder include: Pears, papayas, sun-brewed teas, watermelons, non-citrus herbal teas, apricots, kava and low-acid instant drinks (Postum)  [facility] [address], [address] [place], [place] [address] Phone # [phone] Fax [phone]

## 2014-09-28 ENCOUNTER — Ambulatory Visit: Payer: No Typology Code available for payment source | Admitting: Physical Therapy

## 2014-10-02 ENCOUNTER — Encounter: Payer: No Typology Code available for payment source | Admitting: Physical Therapy

## 2014-10-04 ENCOUNTER — Encounter: Payer: Self-pay | Admitting: Physical Therapy

## 2014-10-04 ENCOUNTER — Ambulatory Visit: Payer: No Typology Code available for payment source | Admitting: Physical Therapy

## 2014-10-04 DIAGNOSIS — M25552 Pain in left hip: Secondary | ICD-10-CM

## 2014-10-04 DIAGNOSIS — R102 Pelvic and perineal pain: Secondary | ICD-10-CM

## 2014-10-04 DIAGNOSIS — M6289 Other specified disorders of muscle: Secondary | ICD-10-CM

## 2014-10-04 DIAGNOSIS — R1031 Right lower quadrant pain: Secondary | ICD-10-CM

## 2014-10-04 DIAGNOSIS — R1032 Left lower quadrant pain: Secondary | ICD-10-CM

## 2014-10-04 DIAGNOSIS — N949 Unspecified condition associated with female genital organs and menstrual cycle: Secondary | ICD-10-CM

## 2014-10-04 NOTE — Therapy (Signed)
[facility] [address], [address] [place], [place], [address] Phone: [phone]   Fax:  [phone]  Physical Therapy Treatment  Patient Details  Name: Jamie Lutz MRN: [id number] Date of Birth: 02/13/1995 Referring Provider:  Owens Shark, MD  Encounter Date: 10/04/2014      PT End of Session - 10/04/14 1157    Visit Number 15   Date for PT Re-Evaluation 10/16/14   PT Start Time 1155   PT Stop Time 1230   PT Time Calculation (min) 35 min   Activity Tolerance Patient tolerated treatment well   Behavior During Therapy [facility] for tasks assessed/performed      Past Medical History  Diagnosis Date  . No pertinent past medical history     Past Surgical History  Procedure Laterality Date  . No past surgeries    . Perineal laceration repair  04/17/2011    Procedure: SUTURE REPAIR PERINEAL LACERATION;  Surgeon: Tilda Burrow, MD;  Location: [facility];  Service: Gynecology;  Laterality: N/A;    There were no vitals filed for this visit.  Visit Diagnosis:  Muscle tightness  Abdominal pain, bilateral lower quadrant  Perineal pain in female  Pain in the hip, left      Subjective Assessment - 10/04/14 1157    Subjective Patient came 10 min. late. I feel better. Left hip pain is 50% better.  I still go to the restroom frequently. I can wait one hour. Patient reports she 80% better with the limping on the right leg with walking. No difficulty with lifting kids.    Limitations Lifting;Sitting   How long can you sit comfortably? no difficulty with sitting   How long can you stand comfortably? no difficulty   How long can you walk comfortably? pain in left hip   Diagnostic tests XR, [place] without (neg)   Patient Stated Goals would like to have less discomfort, anxiety and not have to go to the bathroom all day (every 30 min)   Currently in Pain? Yes   Pain Score 1    Pain Location Hip   Pain Orientation Left;Anterior    Pain Descriptors / Indicators Dull   Pain Type Chronic pain   Pain Onset More than a month ago   Pain Frequency Intermittent   Aggravating Factors  walking, activity   Pain Relieving Factors exercise, stretch, laying down   Multiple Pain Sites No                         [facility] Adult PT Treatment/Exercise - 10/04/14 0001    Lumbar Exercises: Stretches   Piriformis Stretch 30 seconds;1 rep  bil. prone   Lumbar Exercises: Aerobic   Elliptical L3 x 6 min   Lumbar Exercises: Supine   Bent Knee Raise 10 reps  2 sets   Bridge 10 reps  2 sets with knees in and out   Other Supine Lumbar Exercises alternate shoulder and hip flexion 2 sets of 10;    Lumbar Exercises: Prone   Opposite Arm/Leg Raise 20 reps;Right arm/Left leg;Left arm/Right leg   Lumbar Exercises: Quadruped   Madcat/Old Horse 20 reps   Knee/Hip Exercises: Stretches   Hip Flexor Stretch --  using foam roll in prone to massage   ITB Stretch Other (comment)  use foam roll on side massaging   Knee/Hip Exercises: Sidelying   Hip ADduction Strengthening;Right;2 sets;10 reps  PT Education - 10/04/14 1224    Education provided No          PT Short Term Goals - 08/14/14 1108    PT SHORT TERM GOAL #1   Title Patient will be i with initial HEP   Time 4   Period Weeks   Status Achieved   PT SHORT TERM GOAL #2   Title Pt will be able to sit 25% more comfortably for meals, riding in car   Time 4   Period Weeks   Status Achieved  25%   PT SHORT TERM GOAL #3   Title Pt will be able to lift, bathe kids with min pain in LLQ   Time 4   Period Weeks   Status Achieved   PT SHORT TERM GOAL #4   Title Pt will be i with concepts of RICE, stretching, posture   Time 4   Period Weeks   Status Achieved  Pt able to verbalize today througout tx           PT Long Term Goals - 10/04/14 1224    PT LONG TERM GOAL #1   Title Pt will be I with more advanced HEP, self care   Period  Weeks   Status On-going  still learning exercises   PT LONG TERM GOAL #2   Title Pt will be able to report pain <2/10 with ADLs and mobility    Time 8   Period Weeks   Status Achieved   PT LONG TERM GOAL #3   Title Pt will be able to sit as needed without pain in LLQ   Time 8   Period Weeks   Status Achieved   PT LONG TERM GOAL #4   Title pain with intercourse decreased >/= 75%   Time 8   Period Weeks   Status Achieved   PT LONG TERM GOAL #5   Title abdominal strength 4/5 with reduction of diastasis Recti to 1 finger width to assist in reduction in constipation and decreased urgency with urine   Time 8   Period Weeks   Status On-going               Plan - 10/04/14 1225    Clinical Impression Statement Patient is able to abolish her left hip pain with exercises and stretches.  Patient reports it is 80% easier to lift her kids.  Patient abdominal strength is 3/5.  Patient will benefit from physical therapy to increase core sterngth.    Pt will benefit from skilled therapeutic intervention in order to improve on the following deficits Decreased range of motion;Increased fascial restricitons;Dizziness;Decreased activity tolerance;Pain;Impaired flexibility;Decreased balance;Decreased mobility;Decreased strength;Increased muscle spasms   Rehab Potential Good   Clinical Impairments Affecting Rehab Potential None   PT Frequency 2x / week   PT Duration 8 weeks   PT Treatment/Interventions ADLs/Self Care Home Management;Ultrasound;Neuromuscular re-education;Passive range of motion;Functional mobility training;Iontophoresis 4mg /ml Dexamethasone;Moist Heat;Therapeutic exercise;Manual techniques;Therapeutic activities;Cryotherapy;Electrical Stimulation;Dry needling;Balance training;Biofeedback;Patient/family education   PT Next Visit Plan core stabilization   PT Home Exercise Plan review HEP to prepare for Discharge in the next few visits   Consulted and Agree with Plan of Care Patient    Family Member Consulted none        Problem List Patient Active Problem List   Diagnosis Date Noted  . 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 Chronic abdominal pain 05/03/2014  . Constipation 05/03/2014  . Paresthesia 02/06/2014  . Teenage mother 02/03/2014    Kodie Kishi,PT  10/04/2014, 12:28 PM  [facility] [address], [address] [place], [place], [address] Phone: [phone]   Fax:  [phone]

## 2014-10-10 ENCOUNTER — Encounter: Payer: Self-pay | Admitting: Physical Therapy

## 2014-10-10 ENCOUNTER — Ambulatory Visit: Payer: No Typology Code available for payment source | Admitting: Physical Therapy

## 2014-10-10 DIAGNOSIS — R1031 Right lower quadrant pain: Secondary | ICD-10-CM

## 2014-10-10 DIAGNOSIS — N949 Unspecified condition associated with female genital organs and menstrual cycle: Secondary | ICD-10-CM

## 2014-10-10 DIAGNOSIS — M25552 Pain in left hip: Secondary | ICD-10-CM

## 2014-10-10 DIAGNOSIS — M6289 Other specified disorders of muscle: Secondary | ICD-10-CM

## 2014-10-10 DIAGNOSIS — R102 Pelvic and perineal pain: Secondary | ICD-10-CM

## 2014-10-10 DIAGNOSIS — R1032 Left lower quadrant pain: Secondary | ICD-10-CM

## 2014-10-10 NOTE — Therapy (Signed)
[facility] [address], [address] [place], [place], [address] Phone: [phone]   Fax:  [phone]  Physical Therapy Treatment  Patient Details  Name: Jamie Lutz MRN: [id number] Date of Birth: [birth year] Referring Provider:  Owens Shark, MD  Encounter Date: 10/10/2014      PT End of Session - 10/10/14 1157    Visit Number 16   Number of Visits 12   Date for PT Re-Evaluation 10/16/14   PT Start Time 1145   PT Stop Time 1225   PT Time Calculation (min) 40 min   Activity Tolerance Patient tolerated treatment well   Behavior During Therapy [facility] for tasks assessed/performed      Past Medical History  Diagnosis Date  . No pertinent past medical history     Past Surgical History  Procedure Laterality Date  . No past surgeries    . Perineal laceration repair  04/17/2011    Procedure: SUTURE REPAIR PERINEAL LACERATION;  Surgeon: Tilda Burrow, MD;  Location: [facility];  Service: Gynecology;  Laterality: N/A;    There were no vitals filed for this visit.  Visit Diagnosis:  Muscle tightness  Abdominal pain, bilateral lower quadrant  Perineal pain in female  Pain in the hip, left      Subjective Assessment - 10/10/14 1155    Subjective I feel good. Going frequently and no pain. I can now wait 30 minutes compared to 15 min.     Patient is accompained by: Family member   Pertinent History pregnancies 1st 04/16/11 and 2nd 06/11/12   How long can you sit comfortably? no difficulty with sitting   How long can you stand comfortably? no difficulty   How long can you walk comfortably? no pain with walking   Diagnostic tests XR, [place] without (neg)   Patient Stated Goals would like to have less discomfort, anxiety and not have to go to the bathroom all day (every 30 min)   Currently in Pain? No/denies                      Pelvic Floor Special Questions - 10/10/14 0001    Diastasis Recti None   Pelvic Floor Internal Exam Patient confirms identification and approves physical therapist to perform pelvic floor muscle assessment and integrity   Exam Type Vaginal   Palpation right side of urethra, left obturator internist, left ischiococcygeus   Strength good squeeze, good lift, able to hold agaisnt strong resistance           [facility] Adult PT Treatment/Exercise - 10/10/14 0001    Self-Care   Self-Care Other Self-Care Comments   Other Self-Care Comments  ways to delay the urge to urinate, ways to take her mind off of going ot the bathroom   Lumbar Exercises: Aerobic   Elliptical L3 x 6 min                PT Education - 10/10/14 1223    Education provided No          PT Short Term Goals - 08/14/14 1108    PT SHORT TERM GOAL #1   Title Patient will be i with initial HEP   Time 4   Period Weeks   Status Achieved   PT SHORT TERM GOAL #2   Title Pt will be able to sit 25% more comfortably for meals, riding in car   Time 4   Period Weeks   Status  Achieved  25%   PT SHORT TERM GOAL #3   Title Pt will be able to lift, bathe kids with min pain in LLQ   Time 4   Period Weeks   Status Achieved   PT SHORT TERM GOAL #4   Title Pt will be i with concepts of RICE, stretching, posture   Time 4   Period Weeks   Status Achieved  Pt able to verbalize today througout tx           PT Long Term Goals - 10/10/14 1226    PT LONG TERM GOAL #1   Title Pt will be I with more advanced HEP, self care   Time 8   Period Weeks   Status On-going   PT LONG TERM GOAL #2   Title Pt will be able to report pain <2/10 with ADLs and mobility    Time 8   Period Weeks   Status Achieved   PT LONG TERM GOAL #3   Title Pt will be able to sit as needed without pain in LLQ   Time 8   Period Weeks   Status Achieved   PT LONG TERM GOAL #4   Title pain with intercourse decreased >/= 75%   Time 8   Period Weeks   Status Achieved   PT LONG TERM GOAL #5   Title abdominal strength  4/5 with reduction of diastasis Recti to 1 finger width to assist in reduction in constipation and decreased urgency with urine   Time 8   Period Weeks   Status Achieved               Plan - 10/10/14 1223    Clinical Impression Statement Patient is a 19 year old with abdominal pain.  Patient has not pain just goes to the bathroom every 30 minutes.  Patient pelvic floor strength has improved to 4/5.  Patient has some tightness on right side of urethra.  Patient has some palpable tenderness located on right obturator internist and iliococcygeus. Patient is close to discharge.    Pt will benefit from skilled therapeutic intervention in order to improve on the following deficits Decreased range of motion;Increased fascial restricitons;Dizziness;Decreased activity tolerance;Pain;Impaired flexibility;Decreased balance;Decreased mobility;Decreased strength;Increased muscle spasms   Rehab Potential Good   Clinical Impairments Affecting Rehab Potential None   PT Frequency 2x / week   PT Duration 8 weeks   PT Treatment/Interventions ADLs/Self Care Home Management;Ultrasound;Neuromuscular re-education;Passive range of motion;Functional mobility training;Iontophoresis /ml Dexamethasone;Moist Heat;Therapeutic exercise;Manual techniques;Therapeutic activities;Cryotherapy;Electrical Stimulation;Dry needling;Balance training;Biofeedback;Patient/family education   PT Next Visit Plan review HEP; assess for renewal or d/C   PT Home Exercise Plan review HEP to prepare for Discharge in the next few visits   Consulted and Agree with Plan of Care Patient   Family Member Consulted none        Problem List Patient Active Problem List   Diagnosis Date Noted  . 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 Chronic abdominal pain 05/03/2014  . Constipation 05/03/2014  . Paresthesia 02/06/2014  . Teenage mother 02/03/2014    Mette Southgate,PT 10/10/2014, 12:28 PM  [facility] [address] [place], [place], [address] Phone: [phone]   Fax:  [phone]

## 2014-10-12 ENCOUNTER — Ambulatory Visit: Payer: No Typology Code available for payment source | Admitting: Physical Therapy

## 2014-10-20 ENCOUNTER — Ambulatory Visit (INDEPENDENT_AMBULATORY_CARE_PROVIDER_SITE_OTHER): Payer: Self-pay | Admitting: Pediatrics

## 2014-10-20 ENCOUNTER — Telehealth: Payer: Self-pay | Admitting: Pediatrics

## 2014-10-20 ENCOUNTER — Encounter: Payer: Self-pay | Admitting: Pediatrics

## 2014-10-20 VITALS — Temp 97.6°F | Wt 142.2 lb

## 2014-10-20 DIAGNOSIS — L0100 Impetigo, unspecified: Secondary | ICD-10-CM

## 2014-10-20 MED ORDER — MUPIROCIN 2 % EX OINT: 1.0000 "application " | TOPICAL_OINTMENT | Freq: Two times a day (BID) | CUTANEOUS | Status: DC

## 2014-10-20 MED ORDER — CEPHALEXIN 500 MG PO CAPS: 500.0000 mg | ORAL_CAPSULE | Freq: Two times a day (BID) | ORAL | Status: DC

## 2014-10-20 NOTE — Telephone Encounter (Signed)
TC to pt. Updated that her urine culture result was positive, showing that she did have a urinary tract infection. Jamie Lutz reports that she took all of the antibiotics as prescribed. However, pt is still having any pain with urination.Pt reports she is still having other sx; pain in stomach and feeling like she has to frequently urinate throughout the day. Advised pt that information would be sent to Dr. Marina Goodell for further evaluation. Pt agreeable to plan.

## 2014-10-20 NOTE — Telephone Encounter (Signed)
Please call patient to schedule appt for further evaluation of these persistent symptoms.

## 2014-10-20 NOTE — Telephone Encounter (Signed)
Please call the patient and notify her that I sent the following message via My Chart but it appears she has not read it yet.  Just wanted to make sure that she had the results and is feeling better.  Hi Jamie Lutz - Your urine culture result was positive showing that you did have a urinary tract infection. If you took all of the antibiotics as prescribed your infection should be cleared. Please let us know if you are still having any pain with urination. Hope you are feeling all better.  Best,  Dr. Marina Goodell

## 2014-10-21 NOTE — Progress Notes (Signed)
  Subjective:    Jamie Lutz is a 19 y.o. old female here for Rash .    HPI I ran into [name] in the hallway as she was leaving an anemia follow up appt for her son.   She told me that she was developing the same "bumps" on her arms that her younger son recently had. The rash is painful and spreading. Her son had a bullous impetigo that was MSSA and treated successfully with cephalexin.  No fevers, chills, or other symptoms.   Has orange card - prefers to use [facility] $4 list or [facility].   Review of Systems  Constitutional: Negative for fever and chills.   Immunizations needed: flu shot     Objective:    Temp(Src) 97.6 F (36.4 C)  Wt 142 lb 3.2 oz (64.501 kg) Physical Exam  Constitutional: She appears well-developed and well-nourished.  Skin:  Reddened, raised lesions in right axilla with honey crusted borders - a few smaller papules down right arm       Assessment and Plan:     Jamie Lutz was seen today for Rash .   Problem List Items Addressed This Visit    None    Visit Diagnoses    Impetigo    -  Primary    Relevant Medications    cephALEXin (KEFLEX) 500 MG capsule    mupirocin ointment (BACTROBAN) 2 %      Impetigo - presumably same bacteria that son recently had. His was MSSA that was sensitive to first generation cephalosporins. Will treat with 10 days of cephalexin 500 mg BID. Also gave topical mupirocin ointment to use. Return precautions reviewed.   No Follow-up on file.  Dory Peru, MD

## 2014-10-23 NOTE — Telephone Encounter (Signed)
RN called and spoke with patient who is still experiencing symptoms of urinary frequency and abdominal pain. RN scheduled patient with soonest available appt (with PCP) for Wednesday 10/25/14 at 3:45pm. Jamie Lutz stated back appt time and date. Jamie Lutz already has appt scheduled to follow up with Dr. Marina Goodell on 11/03/14.

## 2014-10-24 ENCOUNTER — Encounter: Payer: Self-pay | Admitting: Physical Therapy

## 2014-10-24 ENCOUNTER — Ambulatory Visit: Payer: No Typology Code available for payment source | Attending: Pediatrics | Admitting: Physical Therapy

## 2014-10-24 DIAGNOSIS — G729 Myopathy, unspecified: Secondary | ICD-10-CM | POA: Insufficient documentation

## 2014-10-24 DIAGNOSIS — M6289 Other specified disorders of muscle: Secondary | ICD-10-CM

## 2014-10-24 DIAGNOSIS — M25552 Pain in left hip: Secondary | ICD-10-CM

## 2014-10-24 DIAGNOSIS — N949 Unspecified condition associated with female genital organs and menstrual cycle: Secondary | ICD-10-CM | POA: Insufficient documentation

## 2014-10-24 NOTE — Therapy (Signed)
[facility] [address], [place], [place], [address] Phone: [phone]   Fax:  [phone]  Physical Therapy Treatment  Patient Details  Name: Jamie Lutz MRN: [id number] Date of Birth: Nov 19, 1995 Referring Provider:  Gaspar Skeeters, MD  Encounter Date: 10/24/2014      PT End of Session - 10/24/14 1239    Visit Number 17   Date for PT Re-Evaluation 11/27/14   PT Start Time 0929   PT Stop Time 1313   PT Time Calculation (min) 38 min   Activity Tolerance Patient tolerated treatment well   Behavior During Therapy [facility] for tasks assessed/performed      Past Medical History  Diagnosis Date  . No pertinent past medical history     Past Surgical History  Procedure Laterality Date  . No past surgeries    . Perineal laceration repair  04/17/2011    Procedure: SUTURE REPAIR PERINEAL LACERATION;  Surgeon: Jonnie Kind, MD;  Location: [facility];  Service: Gynecology;  Laterality: N/A;    There were no vitals filed for this visit.  Visit Diagnosis:  Muscle tightness - Plan: PT plan of care cert/re-cert  Pain in the hip, left - Plan: PT plan of care cert/re-cert      Subjective Assessment - 10/24/14 1240    Subjective The pain in the left hip is gone. Sometimes I have to go to the bathroom after an hour.  It happens when I go to the store. It is hard to not go if I have the urge.    Patient is accompained by: Family member   Pertinent History pregnancies 1st 04/16/11 and 2nd 06/11/12   Limitations Lifting;Sitting   How long can you sit comfortably? no difficulty with sitting   How long can you stand comfortably? no difficulty   How long can you walk comfortably? no pain with walking   Diagnostic tests XR, [place] without (neg)   Patient Stated Goals would like to have less discomfort, anxiety and not have to go to the bathroom all day (every 30 min)   Currently in Pain? No/denies            [facility] PT  Assessment - 10/24/14 0001    Assessment   Medical Diagnosis abdominal pain    Next MD Visit Dr. Henrene Pastor this week   Precautions   Precautions None   Balance Screen   Has the patient fallen in the past 6 months No   Has the patient had a decrease in activity level because of a fear of falling?  No   Is the patient reluctant to leave their home because of a fear of falling?  No   Prior Function   Level of Independence Independent   Cognition   Overall Cognitive Status Within Functional Limits for tasks assessed   Observation/Other Assessments   Focus on Therapeutic Outcomes (FOTO)  NT   Strength   Right Hip Flexion 5/5   Right Hip Extension 5/5   Right Hip ABduction 5/5   Left Hip Flexion 5/5   Left Hip Extension 5/5   Left Hip ABduction 5/5   Palpation   SI assessment  Pelvis in correct lignment                  Pelvic Floor Special Questions - 10/24/14 0001    Prior Pregnancies Yes   Number of Pregnancies 2   Number of Vaginal Deliveries 2   Episiotomy Performed Yes  Diastasis Recti None   Marinoff Scale no problems   Urinary urgency Yes   Pelvic Floor Internal Exam Patient confirms identification and approves physical therapist to perform pelvic floor muscle assessment and integrity   Exam Type Vaginal   Palpation Palpable tenderness located in right side of urethra, bil. obturator internist, bil. levaotr ani   Strength good squeeze, good lift, able to hold agaisnt strong resistance           [facility] Adult PT Treatment/Exercise - 10/24/14 0001    Manual Therapy   Manual Therapy Soft tissue mobilization;Internal Pelvic Floor   Internal Pelvic Floor bil. obturator internist, bil. levator ani, bil. sides of urethra                PT Education - 10/24/14 1308    Education provided Yes   Education Details urge to void, review on how to perform soft tissue work to the pelvic floor muscles to reduce pain   Person(s) Educated Patient   Methods  Explanation;Verbal cues   Comprehension Verbalized understanding          PT Short Term Goals - 10/24/14 1315    PT SHORT TERM GOAL #1   Title Patient will be i with initial HEP   Period Weeks   Status Achieved   PT SHORT TERM GOAL #2   Title Pt will be able to sit 25% more comfortably for meals, riding in car   Period Weeks   Status Achieved   PT SHORT TERM GOAL #3   Title Pt will be able to lift, bathe kids with min pain in LLQ   Time 4   Period Weeks   Status Achieved   PT SHORT TERM GOAL #4   Title Pt will be i with concepts of RICE, stretching, posture   Time 4   Period Weeks   Status Achieved   PT SHORT TERM GOAL #5   Title --   Time --   Period --   Status --           PT Long Term Goals - 10/24/14 1317    PT LONG TERM GOAL #1   Title Pt will be I with more advanced HEP, self care   Time 8   Period Weeks   Status On-going   PT LONG TERM GOAL #2   Title Pt will be able to report pain <2/10 with ADLs and mobility    Time 8   Period Weeks   Status Achieved   PT LONG TERM GOAL #3   Title Pt will be able to sit as needed without pain in LLQ   Time 8   Period Weeks   Status Achieved   PT LONG TERM GOAL #4   Title pain with intercourse decreased >/= 75%   Time 8   Period Weeks   Status Achieved   PT LONG TERM GOAL #5   Title abdominal strength 4/5 with reduction of diastasis Recti to 1 finger width to assist in reduction in constipation and decreased urgency with urine   Time 8   Period Weeks   Status Achieved   Additional Long Term Goals   Additional Long Term Goals Yes   PT LONG TERM GOAL #6   Title ability to hold her urine for 2 hours due to reduced urgency and while going shopping   Time 6   Period Weeks   Status New  Plan - 10/24/14 1311    Clinical Impression Statement Patient is a 19 year old with abdominal pian.  Patient reports she has the urge to urinate ever 1 hour especially when she is going to the store.   Patient has not been doing her perineal soft tissue work and is encouraged by the physcial therapist to resume the soft tissue work.  Patient has met her STG's and LTG's with exception of HEP.  A new goal is being added with urinary urgency.  Pelvic floor strength is 4/5.  Bil. hip strength is 5/5.  No diastasis recti.  Pelvis in correct alignment.  No right lower abdominal pain.   Patient is independent with strengthening exercises she has received in the past.  Patient will benefit from physical therapy to reduce urgency of urine.    Pt will benefit from skilled therapeutic intervention in order to improve on the following deficits Decreased range of motion;Increased fascial restricitons;Dizziness;Decreased activity tolerance;Pain;Impaired flexibility;Decreased balance;Decreased mobility;Decreased strength;Increased muscle spasms   Rehab Potential Good   Clinical Impairments Affecting Rehab Potential None   PT Frequency 1x / week   PT Duration 6 weeks   PT Treatment/Interventions ADLs/Self Care Home Management;Ultrasound;Neuromuscular re-education;Passive range of motion;Functional mobility training;Iontophoresis 53m/ml Dexamethasone;Moist Heat;Therapeutic exercise;Manual techniques;Therapeutic activities;Cryotherapy;Electrical Stimulation;Dry needling;Balance training;Biofeedback;Patient/family education   PT Next Visit Plan soft tissue work to pelvic floor, review contracting pelvic floor exercises when having the urge   PT Home Exercise Plan review soft tissue work   Consulted and Agree with Plan of Care Patient   Family Member Consulted none        Problem List Patient Active Problem List   Diagnosis Date Noted  . 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 Chronic abdominal pain 05/03/2014  . Constipation 05/03/2014  . Paresthesia 02/06/2014  . Teenage mother 02/03/2014    Wilborn Membreno,PT 10/24/2014, 1:20 PM  [facility] [address] [place] [address]: [phone]  Fax:  [phone]

## 2014-10-25 ENCOUNTER — Ambulatory Visit (INDEPENDENT_AMBULATORY_CARE_PROVIDER_SITE_OTHER): Payer: Self-pay | Admitting: Licensed Clinical Social Worker

## 2014-10-25 ENCOUNTER — Other Ambulatory Visit: Payer: Self-pay | Admitting: Pediatrics

## 2014-10-25 ENCOUNTER — Ambulatory Visit (INDEPENDENT_AMBULATORY_CARE_PROVIDER_SITE_OTHER): Payer: Self-pay | Admitting: Pediatrics

## 2014-10-25 ENCOUNTER — Encounter: Payer: Self-pay | Admitting: Pediatrics

## 2014-10-25 VITALS — Wt 140.4 lb

## 2014-10-25 DIAGNOSIS — F4322 Adjustment disorder with anxiety: Secondary | ICD-10-CM

## 2014-10-25 DIAGNOSIS — R350 Frequency of micturition: Secondary | ICD-10-CM

## 2014-10-25 DIAGNOSIS — N949 Unspecified condition associated with female genital organs and menstrual cycle: Secondary | ICD-10-CM

## 2014-10-25 DIAGNOSIS — R1030 Lower abdominal pain, unspecified: Secondary | ICD-10-CM

## 2014-10-25 LAB — POCT URINALYSIS DIPSTICK
Bilirubin, UA: NEGATIVE
Blood, UA: NEGATIVE
Ketones, UA: NEGATIVE
Nitrite, UA: NEGATIVE
SPEC GRAV UA: 1.01
UROBILINOGEN UA: NEGATIVE
pH, UA: 8

## 2014-10-25 MED ORDER — CEFTRIAXONE SODIUM 1 G IJ SOLR
250.0000 mg | Freq: Once | INTRAMUSCULAR | Status: AC
  Administered 2014-10-25: 250 mg via INTRAMUSCULAR

## 2014-10-25 NOTE — BH Specialist Note (Signed)
Referring Provider: Dory Peru, MD Session Time:  5:05 - 5:25 (20 minutes) Type of Service: Behavioral Health - Individual/Family Interpreter: No.  Interpreter Name & Language: NA   PRESENTING CONCERNS:  Jamie Lutz is a 19 y.o. female brought in by patient. Jamie Lutz was referred to [facility] for symptoms of social anxiety, including chest and stomach tightness when out in public.   GOALS ADDRESSED:  Enhance positive coping skills including grounding Identify barriers to social emotional development    INTERVENTIONS:  Assessed current condition/needs Built rapport Discussed secondary screens Discussed integrated care Provided psychoeducation Stress managment    ASSESSMENT/OUTCOME:  Jamie Lutz is pleasant but appears nervous. She immediately states that she is nervous. Nervousness increases in crowded spaces (the store) and in a car someone else is driving. Nervousness is new since Jamie Lutz had her first child. She has not tried any treatments but is really motivated-- she wants to feel better. She is unable to go out by herself, having to check out at the grocery store would be impossible for her.   Explored Mindshift app today.   Modeled grounding for [facility] for times of intense anxiety. She voiced that she would try.   BAI (Beck Anxiety Scale) with a score of 28 (moderate).   TREATMENT PLAN:  Jamie Lutz with try Mindshift app. She will try grounding for intense, uncomfortable situations.    PLAN FOR NEXT VISIT: Assess progress. Introduce CBT.    Scheduled next visit: Oct. 14, joint visit with Dr. Lillette Boxer Behavioral Health Clinician [facility]

## 2014-10-25 NOTE — Patient Instructions (Signed)
Restart your doxycyline two times per day.  I will see you back on Friday and review your results with you.  Dory Peru, MD

## 2014-10-25 NOTE — Progress Notes (Signed)
  Subjective:    Jamie Lutz is a 19 y.o. old female here for Urinary Tract Infection .    HPI  Seen in early August by Dr Marina Goodell for lower abdominal pain.  Was treated for presumed PID due to cervical motion tenderness. Received metronidazole and doxycycline. Also IM ceftriaxone.   Urine culture grew E coli - was given rx for Macrobid - took 5 days of antibiotics. Purchased and took entire course of abx.  Stopped doxycycline and metronidazole when urine grew out E Coli and GC/CT was negative  Symptoms improved somewhat for a few days but then started back.   Having lower abdominal pain - feels cramping. Also frequent urination.   Not currently sexually active - last sex was approx 2 months ago.  Was having significant pain with sex.   Wondering if anxiety could be causing the symptoms.  When she goes out to a store, gets "anxious" and then feels that she has to go to the bathroom.   Has myofascial abdominal wall pain - getting PT.   Seen last week and had impetigo - did not fill oral abx rx but did use topical mupirocin and things are getting better.   Review of Systems  Constitutional: Negative for fever.  Gastrointestinal: Negative for vomiting.  Genitourinary: Negative for menstrual problem.    Immunizations needed: flu shot     Objective:    Wt 140 lb 6.4 oz (63.685 kg)  LMP 10/16/2014 (Approximate) Physical Exam  Constitutional: She appears well-developed and well-nourished.  Cardiovascular: Normal rate.   No murmur heard. Pulmonary/Chest: Effort normal and breath sounds normal.  Abdominal: Soft. There is no rebound and no guarding.  Genitourinary:  Pelvic exam done - no erythema of cervix No masses palpated on bimanual exam but does have mild/moderate cervical motion tenderness.        Assessment and Plan:     Jamie Lutz was seen today for Urinary Tract Infection .   Problem List Items Addressed This Visit    None    Visit Diagnoses    Urine frequency    -   Primary    Relevant Orders    POCT urinalysis dipstick (Completed)    Urine culture (Completed)    Cervical motion tenderness        Lower abdominal pain        Relevant Orders    WET PREP BY MOLECULAR PROBE    GC/Chlamydia Probe Amp      Urinary frequency with pelvic pain and cervical motion tenderness - symptoms have been chronic and > 2 months. Treated for PID previously but found to have UTI. Also completed what should be adequate treatment for her UTI based on cultures. Given ongoing CMT will send vaginitis probe again but will also treat presumptively for PID while awaiting cultures. CTX given in clinci - to restart the leftover doxycycline that she has at home.   Given anxiety symptoms did have Byrd Hesselbach meet [facility] clinician today. Has appt with Dr Marina Goodell already scheduled for next week, but will plan to follow up with me in 48 hours to review results and assess response to treatment.   Follow up with me in 48 hours Has follow up with Dr Marina Goodell scheduled for next week.   Dory Peru, MD

## 2014-10-26 LAB — WET PREP BY MOLECULAR PROBE
CANDIDA SPECIES: POSITIVE — AB
Gardnerella vaginalis: NEGATIVE
TRICHOMONAS VAG: NEGATIVE

## 2014-10-27 ENCOUNTER — Ambulatory Visit: Payer: Self-pay | Admitting: Pediatrics

## 2014-10-27 LAB — GC/CHLAMYDIA PROBE AMP
CT PROBE, AMP APTIMA: NEGATIVE
GC PROBE AMP APTIMA: NEGATIVE

## 2014-10-27 LAB — URINE CULTURE
COLONY COUNT: NO GROWTH
ORGANISM ID, BACTERIA: NO GROWTH

## 2014-10-30 ENCOUNTER — Ambulatory Visit: Payer: Self-pay | Admitting: Pediatrics

## 2014-11-02 ENCOUNTER — Encounter: Payer: Self-pay | Admitting: Pediatrics

## 2014-11-02 NOTE — Progress Notes (Signed)
Pre-Visit Planning  Jamie BlowMaria G Tello-Banda  is a 19 y.o. female referred by Dory PeruBROWN,KIRSTEN R, MD.   Last seen [facility] on 09/01/2014 for UTI.   Previous Psych Screenings?  Yes, Beck AI score = 28 (moderate anxiety)  Treatment plan at last visit included UTI treatment.  Has continued to see PT for myofascial abdominal pain and saw PCP for urinary frequency and concern for PID, was treated for PID, [facility] noted, plan to introduce CBT in future.   Clinical Staff Visit Tasks:   - Urine GC/CT due? no - Psych Screenings Due? Yes, PHQSADs  Provider Visit Tasks: - Assess pelvic pain and review treatment completed to date, consider urogyn referral, consider additional labs or imaging [facility] - Consider Elavil for chronic pain and anxiety - Pertinent Labs? no

## 2014-11-03 ENCOUNTER — Encounter: Payer: Self-pay | Admitting: Pediatrics

## 2014-11-03 ENCOUNTER — Ambulatory Visit (INDEPENDENT_AMBULATORY_CARE_PROVIDER_SITE_OTHER): Payer: Self-pay | Admitting: Pediatrics

## 2014-11-03 ENCOUNTER — Ambulatory Visit (INDEPENDENT_AMBULATORY_CARE_PROVIDER_SITE_OTHER): Payer: Self-pay | Admitting: Licensed Clinical Social Worker

## 2014-11-03 VITALS — BP 110/70 | HR 81 | Ht 64.0 in | Wt 140.2 lb

## 2014-11-03 DIAGNOSIS — G8929 Other chronic pain: Secondary | ICD-10-CM

## 2014-11-03 DIAGNOSIS — F4322 Adjustment disorder with anxiety: Secondary | ICD-10-CM

## 2014-11-03 DIAGNOSIS — R109 Unspecified abdominal pain: Secondary | ICD-10-CM

## 2014-11-03 NOTE — Progress Notes (Signed)
TC to [facility]. Results were viewable by [facility] lab tech for [employer] and Wet Prep, but not visible on Epic.  Per tech, labs were "re-qued" and faxed to office. Tech stated that labs appeared as "reviewed" and that this may be why we are unable to see results.

## 2014-11-03 NOTE — Progress Notes (Signed)
THIS RECORD MAY CONTAIN CONFIDENTIAL INFORMATION THAT SHOULD NOT BE RELEASED WITHOUT REVIEW OF THE SERVICE PROVIDER.  Adolescent Medicine Consultation Follow-Up Visit Jamie Lutz  is a 19 y.o. female referred by Dillon Bjork, MD here today for follow-up.    Growth Chart Viewed? not applicable   History was provided by the patient.  PCP Confirmed?  yes  My Chart Activated?   yes   Previsit planning completed:  yes  Pre-Visit Planning  Jamie Lutz  is a 19 y.o. female referred by Royston Cowper, MD.   Last seen in [facility] on 09/01/2014 for UTI.   Previous Psych Screenings?  Yes, Beck AI score = 28 (moderate anxiety)  Treatment plan at last visit included UTI treatment.  Has continued to see PT for myofascial abdominal pain and saw PCP for urinary frequency and concern for PID, was treated for PID, saw [facility] with moderate anxiety noted, plan to introduce CBT in future.   Clinical Staff Visit Tasks:   - Urine GC/CT due? no - Psych Screenings Due? Yes, PHQSADs  Provider Visit Tasks: - Assess pelvic pain and review treatment completed to date, consider urogyn referral, consider additional labs or imaging - [facility] involvement - Consider Elavil for chronic pain and anxiety - Pertinent Labs? no  HPI:    Met with [place], deep breathing exercises, challenging her social anxiety, will start seeing [place] intern for CBT for social anxiety Will work on deep breathing Follow-up after PID diagnosis Pt reports she did not take doxycycline as recommended by Dr. Owens Shark Had a shot and was worried the medication in the shot would interact with the other medications Past few days she has been having constipation and really bad HAs Not drinking a lot of water, decreased appetite Feels some of her urinary symptoms are from anxiety Belly pain has gone away although she has some tenderness with palpation  She mentioned being interested in medication to treat her  anxiety.  Patient's last menstrual period was 10/16/2014 (approximate). No Known Allergies Current Outpatient Prescriptions on File Prior to Visit  Medication Sig Dispense Refill  . mupirocin ointment (BACTROBAN) 2 % Apply 1 application topically 2 (two) times daily. 22 g 0   No current facility-administered medications on file prior to visit.   The following portions of the patient's history were reviewed and updated as appropriate: allergies, current medications and problem list.  Physical Exam:  Filed Vitals:   11/03/14 1558  BP: 110/70  Pulse: 81  Height: '5\' 4"'  (1.626 m)  Weight: 140 lb 3.2 oz (63.594 kg)   BP 110/70 mmHg  Pulse 81  Ht '5\' 4"'  (1.626 m)  Wt 140 lb 3.2 oz (63.594 kg)  BMI 24.05 kg/m2  LMP 10/16/2014 (Approximate) Body mass index: body mass index is 24.05 kg/(m^2). Facility age limit for growth percentiles is 20 years.  Physical Exam  Constitutional: She appears well-developed. No distress.  Abdominal: Soft. She exhibits no mass. There is tenderness (mild midline suprapubic tenderness). There is no rebound and no guarding.     Assessment/Plan: 20 yo female with chronic abdominal pain.  She has seen improvement in her symptoms with PT for myofascial pain.  She continues to have suprapubic pain on palpation, s/p UTI treatment but she did not complete PID treatment and thus I advised her to complete that.  We also discussed the importance of treatment of her constipation.  She declined a miralax prescription but agreed to increase her water intake, food intake and exercise  some more.  She will start meeting regularly with [facility] to address her social anxiety.  We discussed considering medications if patient has difficulty engaging in CBT.  In 1 month, we will recheck her pain and if persistent, would refer to [place] for further evaluation.    Follow-up:  Return in about 1 month (around 12/04/2014).   Medical decision-making:  > 25 minutes spent, more than 50% of  appointment was spent discussing diagnosis and management of symptoms

## 2014-11-03 NOTE — BH Specialist Note (Signed)
Primary Care Provider: Dory PeruBROWN,KIRSTEN R, MD  Referring Provider: Delorse LekPERRY, MARTHA, MD Session Time:  [id number] - 1630 (15 minutes) Type of Service: Behavioral Health - Individual/Family Interpreter: No.  Interpreter Name & Language: NA   PRESENTING CONCERNS:  Jamie Lutz is a 19 y.o. female brought in by patient. [facility], including chest and stomach tightness when out in public.   GOALS ADDRESSED:  Enhance positive coping skills including grounding   INTERVENTIONS:  Assessed current condition/needs Built rapport Discussed integrated care Provided psychoeducation   ASSESSMENT/OUTCOME:  Jamie Lutz is pleasant and seems motivated to work on her anxiety. Jamie Lutz reports that the Mindshift app has been helpful for her and that she was able to go to the grocery store this week and stand in the checkout line. She reports feeling very proud of herself for that.   [facility] informed [facility] of her counseling options, and she will begin ongoing sessions [facility].   [facility] reviewed deep breathing through the box model with Jamie Lutz.    TREATMENT PLAN:  Jamie Lutz with continue using Mindshift app. She will try deep breathing for intense, uncomfortable situations.    PLAN FOR NEXT VISIT: Assess progress.  Discuss influence of anxiety on physical health.  Complete CCA. Identify positive coping techniques that may be effective.     Scheduled next visit: 11/10/2014 @ 10am  Tana ConchMadeleine Morris Behavioral Health Intern, [facility]

## 2014-11-03 NOTE — Patient Instructions (Addendum)
-   Recommend increase water intake - You can try to take miralax if you are still having trouble going to the bathroom - Restart your doxycycline and metronidazole - make sure to take the full dose  - Call the general number for [facility] at [phone] and ask to speak with Stephani PoliceBarbara Haskins, the financial advisor and billing manager, to ask where you can get dental coverage.

## 2014-11-06 NOTE — BH Specialist Note (Signed)
I reviewed Intern's patient visit. I concur with the treatment plan as documented in the intern's note. I was present for a portion of this visit.   Patient's sister was present for a portion of this visit. Patient voiced consent to speak freely in front of sibling.   Clide DeutscherLauren R Trudee Chirino, MSW, [employer] Behavioral Health Clinician [facility]

## 2014-11-07 ENCOUNTER — Encounter: Payer: Self-pay | Admitting: Physical Therapy

## 2014-11-07 ENCOUNTER — Ambulatory Visit: Payer: No Typology Code available for payment source | Admitting: Physical Therapy

## 2014-11-07 DIAGNOSIS — R102 Pelvic and perineal pain: Secondary | ICD-10-CM

## 2014-11-07 DIAGNOSIS — N949 Unspecified condition associated with female genital organs and menstrual cycle: Secondary | ICD-10-CM

## 2014-11-07 DIAGNOSIS — M6289 Other specified disorders of muscle: Secondary | ICD-10-CM

## 2014-11-07 NOTE — Therapy (Signed)
[facility] [address], [address] [place], [place], [address] Phone: [phone]   Fax:  [phone]  Physical Therapy Treatment  Patient Details  Name: Jamie BlowMaria G Lutz MRN: [id number] Date of Birth: 01/04/1996 Referring Provider: Delorse LekPerry, Martha  Encounter Date: 11/07/2014      PT End of Session - 11/07/14 1235    Visit Number 18   Date for PT Re-Evaluation 11/27/14   PT Start Time 1232   PT Stop Time 1310   PT Time Calculation (min) 38 min   Activity Tolerance Patient tolerated treatment well   Behavior During Therapy [facility] for tasks assessed/performed      Past Medical History  Diagnosis Date  . No pertinent past medical history     Past Surgical History  Procedure Laterality Date  . No past surgeries    . Perineal laceration repair  04/17/2011    Procedure: SUTURE REPAIR PERINEAL LACERATION;  Surgeon: Tilda BurrowJohn V Ferguson, MD;  Location: [facility];  Service: Gynecology;  Laterality: N/A;    There were no vitals filed for this visit.  Visit Diagnosis:  Muscle tightness  Perineal pain in female      Subjective Assessment - 11/07/14 1236    Subjective The urge to go to the bathroom is good sometimes. Less tender doing the perineal soft tissue work.    Limitations Lifting;Sitting   How long can you sit comfortably? no difficulty with sitting   How long can you stand comfortably? no difficulty   How long can you walk comfortably? no pain with walking   Diagnostic tests XR, US without (neg)   Patient Stated Goals would like to have less discomfort, anxiety and not have to go to the bathroom all day (every 30 min)   Currently in Pain? No/denies            [facility] PT Assessment - 11/07/14 0001    Assessment   Medical Diagnosis abdominal pain    Referring Provider Delorse LekPerry, Martha                  Pelvic Floor Special Questions - 11/07/14 0001    Pelvic Floor Internal Exam Patient confirms  identification and approves physical therapist to perform pelvic floor muscle assessment and integrity   Exam Type Vaginal   Strength strong squeeze, against strong resistance           [facility] Adult PT Treatment/Exercise - 11/07/14 0001    Lumbar Exercises: Aerobic   Elliptical L3 x 6 min   Manual Therapy   Manual Therapy Soft tissue mobilization;Internal Pelvic Floor   Internal Pelvic Floor bil. obturator internist, bil. levator ani, bil. sides of urethra                PT Education - 11/07/14 1305    Education provided Yes   Education Details interscapular strengthening   Person(s) Educated Patient   Methods Explanation;Demonstration;Verbal cues;Handout   Comprehension Returned demonstration;Verbalized understanding          PT Short Term Goals - 10/24/14 1315    PT SHORT TERM GOAL #1   Title Patient will be i with initial HEP   Period Weeks   Status Achieved   PT SHORT TERM GOAL #2   Title Pt will be able to sit 25% more comfortably for meals, riding in car   Period Weeks   Status Achieved   PT SHORT TERM GOAL #3   Title Pt will be able to lift, bathe  kids with min pain in LLQ   Time 4   Period Weeks   Status Achieved   PT SHORT TERM GOAL #4   Title Pt will be i with concepts of RICE, stretching, posture   Time 4   Period Weeks   Status Achieved   PT SHORT TERM GOAL #5   Title --   Time --   Period --   Status --           PT Long Term Goals - 11/07/14 1238    PT LONG TERM GOAL #1   Title Pt will be I with more advanced HEP, self care   Time 8   Period Weeks   Status On-going   PT LONG TERM GOAL #6   Title ability to hold her urine for 2 hours due to reduced urgency and while going shopping   Time 6   Period Weeks   Status On-going  90 min               Plan - 11/07/14 1306    Clinical Impression Statement Patient is a 19 year old female with abdominal pain. Patient has increased pelvic floor strength to 5/5.  Patient  continues to have to urinate every 90 min.  Paitent has tenderness located in bil. sides of urethra and bil. obturator internist.  Patient would benefit from pphysical therapy to improve the length of time to urinate.    Pt will benefit from skilled therapeutic intervention in order to improve on the following deficits Decreased range of motion;Increased fascial restricitons;Dizziness;Decreased activity tolerance;Pain;Impaired flexibility;Decreased balance;Decreased mobility;Decreased strength;Increased muscle spasms   Rehab Potential Good   Clinical Impairments Affecting Rehab Potential None   PT Frequency 1x / week   PT Duration 6 weeks   PT Treatment/Interventions ADLs/Self Care Home Management;Ultrasound;Neuromuscular re-education;Passive range of motion;Functional mobility training;Iontophoresis /ml Dexamethasone;Moist Heat;Therapeutic exercise;Manual techniques;Therapeutic activities;Cryotherapy;Electrical Stimulation;Dry needling;Balance training;Biofeedback;Patient/family education   PT Next Visit Plan soft tissue work to pelvic floor, review contracting pelvic floor exercises when having the urge   PT Home Exercise Plan progress as needed   Consulted and Agree with Plan of Care Patient   Family Member Consulted none        Problem List Patient Active Problem List   Diagnosis Date Noted  . Adjustment disorder with anxious mood 11/03/2014  . 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 Chronic abdominal pain 05/03/2014  . Constipation 05/03/2014  . Paresthesia 02/06/2014  . Teenage mother 02/03/2014    Jamie Lutz,PT 11/07/2014, 1:09 PM  [facility] [address] [place], [place], [address] Phone: [phone]   Fax:  [phone]  Name: Jamie Lutz MRN: [id number] Date of Birth: [birth year]

## 2014-11-07 NOTE — Patient Instructions (Signed)
  PNF Strengthening: Resisted   Standing with resistive band around each hand, bring right arm up and away, thumb back. Repeat _10___ times per set. Do _2___ sets per session. Do _1-2___ sessions per day.      Resisted Horizontal Abduction: Bilateral   Sit or stand, tubing in both hands, arms out in front. Keeping arms straight, pinch shoulder blades together and stretch arms out. Repeat _10___ times per set. Do 2____ sets per session. Do _1-2___ sessions per day.                  Scapular Retraction: Elbow Flexion (Standing)   With elbows bent to 90, pinch shoulder blades together and rotate arms out, keeping elbows bent. Repeat _10___ times per set. Do _1___ sets per session. Do many____ sessions per day.    Strengthening: Resisted Extension   Hold tubing in right hand, arm forward. Pull arm back, elbow straight. Repeat _10___ times per set. Do _2___ sets per session. Do _1-2___ sessions per day.  Can place band around the front of your body and perform with both arms at the same time.  [facility] [address], [address] [place], [place] [address] Phone # [phone] Fax [phone] 

## 2014-11-08 ENCOUNTER — Telehealth: Payer: Self-pay | Admitting: Pediatrics

## 2014-11-08 MED ORDER — FLUCONAZOLE 150 MG PO TABS: 150.0000 mg | ORAL_TABLET | Freq: Every day | ORAL | Status: DC

## 2014-11-08 NOTE — Telephone Encounter (Signed)
Spoke with Jamie Lutz regarding vaginitis probe results. Was positive for yeast and has had some itching.  [facility]  Can cancel f/u appt with me for 11/24/14. Other appointments reviewed.  Dory PeruBROWN,Bernetta Sutley R, MD

## 2014-11-09 NOTE — Telephone Encounter (Signed)
Cancelled 11/24/14 F/U appt and kept [facility]. appt

## 2014-11-10 ENCOUNTER — Ambulatory Visit (INDEPENDENT_AMBULATORY_CARE_PROVIDER_SITE_OTHER): Payer: No Typology Code available for payment source | Admitting: Licensed Clinical Social Worker

## 2014-11-10 DIAGNOSIS — F4322 Adjustment disorder with anxiety: Secondary | ICD-10-CM

## 2014-11-10 NOTE — BH Specialist Note (Signed)
Referring Provider: Dory PeruBROWN,KIRSTEN R, MD Session Time:  1020 - 1105 (45 minutes) Type of Service: Behavioral Health - Individual Interpreter: No.  Interpreter Name & Language: N/A  COMPREHENSIVE CLINICAL ASSESSMENT  PRESENTING CONCERNS:   Jamie Lutz is a 19 y.o. female brought in by self. [facility].  Previous mental health services Have you ever been treated for a mental health problem, when, 2where, by whom? No       Have you ever had a mental health hospitalization, how many times, length of stay? No      Have you ever been treated with medication, name, reason, response? No  Not for mental health    Have you ever had suicidal thoughts or attempted suicide, when, how? No      Medical history Medical treatment and/or problems, explain: Yes pelvic inflammation, maybe infection, taking meds for inflammation Name of primary care physician/last physical exam: Dr. Cephus SlaterKristen Brown  Allergies: NA     Medication reactions: NA                Current medications: fluconazole 150mg , mupirocin ointment, PID treatment Prescribed by: Dr. Cephus SlaterKristen Brown Is there any history of mental health problems or substance abuse in your family, whom? No  Has anyone in your family been hospitalized, who, where, length of stay? Yes uncle kidney failure  Social/family history Who lives in your current household? Mom and 5 brothers and sisters, 2 sons, and uncle  Family of origin (childhood history)Where were you born? In [place] Where did you grow up? [place] How many different homes have you lived? 5 Describe your childhood: loves family, lived with stepdad, mom and sister when she was younger, step dad completed suicide when Jamie HesselbachMaria was [age] and she was the one who found him, used to have nightmares about his body, does not happen frequently anymore Do you have siblings, step/half siblings, list names, relation, sex, age? Yes oldest  917, Gabby 5114, Jose 11, twins are 6 Are your parents separated/divorced, when and why? Yes, mom remarried when Jamie HesselbachMaria was 4 Social supports (personal and professional): mom  Education How many grades have you completed? high school diploma/GED Did you have any problems in school, what type? No reports loving school, likes to learn new things, favorite class was child development   Employment (financial issues) Not employed, used to work at [employer] but had to stop because of pelvic pain and anxiety   Trauma/Abuse history: Have you ever been exposed to any form of abuse, what type? No  Have you ever been exposed to something traumatic, describe? Yes finding stepdad's body after he completed suicide. Does not wish to talk about it today, and reports that she has been able to cope with the death well  Substance use Do you use Caffeine? No Type, frequency?   Do you use Nicotine? No Type, frequency, ppd?   Do you use Alcohol? No  Type, frequency?  How old were you when you first tasted alcohol? 18    Have you ever used illicit drugs or taken more than prescribed, type, frequency, date of last usage? No   Mental Status: General Appearance [name]/Behavior:  Neat Eye Contact:  Good Motor Behavior:  Normal Speech:  Normal Level of Consciousness:  Alert Mood:  anxious at times Affect:  Appropriate Anxiety Level:  Minimal Thought Process:  Coherent Thought Content:  WNL Perception:  Normal Judgment:  Good Insight:  Present  Diagnosis Adjustment reaction with anxiety  GOALS ADDRESSED:  Complete CCA Identify goals    INTERVENTIONS:  Assessed current condition/needs Built rapport Discussed integrated care Provided psycho education Substance use risk assess Suicide risk assess Specific problem-solving Stress management   ASSESSMENT/OUTCOME:  Jamie Lutz presented as happy and minimally anxious. This [employer] intern completed CCA with Jamie Lutz, and set counseling goals. Jamie Lutz would like to  be able to meet new people and go to new places, and she would also like to be able to go to the store.   Jamie Lutz disclosed that in the last week, she tried to go for a run and began to panic. She was not able to identify what triggered her panic, because she enjoys going for runs and has been on that same route before. She was unable to complete the run and went home.   Jamie Lutz reports that deep breathing has been effective in helping her relax, and she has tried using grounding techniques with varied success.    PLAN:  Jamie Lutz will attempt to go one 1 run during this next week. She will use grounding techniques when she begins to feel anxious, and deep breathing during the height of her anxiety and continue deep breathing until she no longer feels "jittery" (reported aftermath of panic attacks)  Scheduled next visit: 11/17/2014 @ 10:30am   Tana Conch Behavioral Health Intern, [facility]

## 2014-11-15 ENCOUNTER — Encounter: Payer: Self-pay | Admitting: Physical Therapy

## 2014-11-15 ENCOUNTER — Ambulatory Visit: Payer: No Typology Code available for payment source | Admitting: Physical Therapy

## 2014-11-15 DIAGNOSIS — R102 Pelvic and perineal pain: Secondary | ICD-10-CM

## 2014-11-15 DIAGNOSIS — N949 Unspecified condition associated with female genital organs and menstrual cycle: Secondary | ICD-10-CM

## 2014-11-15 DIAGNOSIS — M6289 Other specified disorders of muscle: Secondary | ICD-10-CM

## 2014-11-15 NOTE — Therapy (Addendum)
[facility] [address] [place], [place], [address] Phone: [phone]   Fax:  [phone]  Physical Therapy Treatment  Patient Details  Name: Jamie Lutz MRN: [id number] Date of Birth: [birth year] Referring Provider: Dr. Lenore Cordia  Encounter Date: 11/15/2014      PT End of Session - 11/15/14 1237    Visit Number 19   Date for PT Re-Evaluation 11/27/14   PT Start Time 1230   PT Stop Time 1300   PT Time Calculation (min) 30 min   Activity Tolerance Patient tolerated treatment well   Behavior During Therapy [facility] for tasks assessed/performed      Past Medical History  Diagnosis Date  . No pertinent past medical history     Past Surgical History  Procedure Laterality Date  . No past surgeries    . Perineal laceration repair  04/17/2011    Procedure: SUTURE REPAIR PERINEAL LACERATION;  Surgeon: Jonnie Kind, MD;  Location: [facility] [facility];  Service: Gynecology;  Laterality: N/A;    There were no vitals filed for this visit.  Visit Diagnosis:  Muscle tightness  Perineal pain in female      Subjective Assessment - 11/15/14 1237    Subjective NO change with urge to go to the bathroom.  Patient reports this week has been bad. I am seeing someone about my anxiety and have learned some relaxation technique to help.    Patient is accompained by: Family member   Patient Stated Goals would like to have less discomfort, anxiety and not have to go to the bathroom all day (every 30 min)   Currently in Pain? No/denies            [facility] PT Assessment - 11/15/14 0001    Assessment   Referring Provider Dr. Lenore Cordia   Next MD Visit 12/04/2014                  Pelvic Floor Special Questions - 11/15/14 0001    Pelvic Floor Internal Exam Patient confirms identification and approves physical therapist to perform pelvic floor muscle assessment and integrity   Exam Type Vaginal   Palpation Palpable  tenderness located on left side of urethra and bil. oturator internist   Strength strong squeeze, against strong resistance           [facility] Adult PT Treatment/Exercise - 11/15/14 0001    Neuro Re-ed    Neuro Re-ed Details  deep breathing to reduce urge to urinate   Lumbar Exercises: Aerobic   Elliptical L3 x 6 min   Manual Therapy   Manual Therapy Internal Pelvic Floor   Internal Pelvic Floor bil. sides of urethra, bil. obturator internist and bil. levator ani                PT Education - 11/15/14 1306    Education provided Yes   Education Details Verbally reviewed patient past HEP and she has no questions   Person(s) Educated Patient   Methods Explanation   Comprehension Verbalized understanding          PT Short Term Goals - 10/24/14 1315    PT SHORT TERM GOAL #1   Title Patient will be i with initial HEP   Period Weeks   Status Achieved   PT SHORT TERM GOAL #2   Title Pt will be able to sit 25% more comfortably for meals, riding in car   Period Weeks   Status Achieved  PT SHORT TERM GOAL #3   Title Pt will be able to lift, bathe kids with min pain in LLQ   Time 4   Period Weeks   Status Achieved   PT SHORT TERM GOAL #4   Title Pt will be i with concepts of RICE, stretching, posture   Time 4   Period Weeks   Status Achieved   PT SHORT TERM GOAL #5   Title --   Time --   Period --   Status --           PT Long Term Goals - 11/15/14 1315    PT LONG TERM GOAL #1   Title Pt will be I with more advanced HEP, self care   Time 8   Period Weeks   Status Achieved   PT LONG TERM GOAL #6   Title ability to hold her urine for 2 hours due to reduced urgency and while going shopping   Time 6   Period Weeks   Status On-going               Plan - 11/15/14 1312    Clinical Impression Statement Patient is a 19 year old female with abdominal pain.  Patient continues to have 5/5 pelvic floor strength.  Patient contiunues to have the urgency to  urinate.  Patient is anxious and is seeing someone for counseling. Patient has palpable tenderness located in left side of urethra, and bil. obutrator internist.  Patient will benfit from physical therapy t oreduce urgency.    Pt will benefit from skilled therapeutic intervention in order to improve on the following deficits Decreased range of motion;Increased fascial restricitons;Dizziness;Decreased activity tolerance;Pain;Impaired flexibility;Decreased balance;Decreased mobility;Decreased strength;Increased muscle spasms   Rehab Potential Good   Clinical Impairments Affecting Rehab Potential None   PT Frequency 1x / week   PT Duration 6 weeks   PT Treatment/Interventions ADLs/Self Care Home Management;Ultrasound;Neuromuscular re-education;Passive range of motion;Functional mobility training;Iontophoresis 26m/ml Dexamethasone;Moist Heat;Therapeutic exercise;Manual techniques;Therapeutic activities;Cryotherapy;Electrical Stimulation;Dry needling;Balance training;Biofeedback;Patient/family education   PT Next Visit Plan Possible D/C   PT Home Exercise Plan progress as needed   Consulted and Agree with Plan of Care Patient   Family Member Consulted none        Problem List Patient Active Problem List   Diagnosis Date Noted  . Adjustment disorder with anxious mood 11/03/2014  . 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 Chronic abdominal pain 05/03/2014  . Constipation 05/03/2014  . Paresthesia 02/06/2014  . Teenage mother 02/03/2014    Violia Knopf,PT 11/15/2014, 1:17 PM  [facility] [address] [place] [address]Phone: [phone]  Fax:  [phone] Name: Jamie DECOSTEMRN: [id number] of Birth: 1Mar 27, 1997   PHYSICAL THERAPY DISCHARGE SUMMARY  Visits from Start of Care: 19  Current functional level related to goals / functional outcomes: See above. Patient is having difficulty holding  her urine every 2 hours.  Patient is using ways to take her mind off going to the bathroom but it does not always work. Patient had to cancel her last visit on 11/22/2014 and therapist and patient agreed about discharge.    Remaining deficits: See above   Education / Equipment: HEP Plan: Patient agrees to discharge.  Patient goals were not met. Patient is being discharged due to meeting the stated rehab goals.  ?????  CEarlie Counts PT 11/22/2014 12:20 PM

## 2014-11-17 ENCOUNTER — Institutional Professional Consult (permissible substitution): Payer: No Typology Code available for payment source | Admitting: Licensed Clinical Social Worker

## 2014-11-17 ENCOUNTER — Encounter: Payer: Self-pay | Admitting: Licensed Clinical Social Worker

## 2014-11-17 DIAGNOSIS — F4322 Adjustment disorder with anxiety: Secondary | ICD-10-CM

## 2014-11-17 NOTE — BH Specialist Note (Signed)
Referring Provider: Dory PeruBROWN,KIRSTEN R, MD Session Time:  1030 - 1130 (60 minutes) Type of Service: Behavioral Health - Individual Interpreter: No.  Interpreter Name & Language: N/A  COMPREHENSIVE CLINICAL ASSESSMENT  PRESENTING CONCERNS:   Jamie Lutz is a 19 y.o. female brought in by self. [facility].   GOALS ADDRESSED:  Reduce overall frequency, intensity, and duration of the anxiety so that daily functioning is not impaired Stabilize anxiety level while increasing ability to function on a daily basis Enhance ability to effectively cope with the full variety of lifes anxieties    INTERVENTIONS:  Assessed current condition/needs Built rapport Provided psycho education Stress management Anxiety hierarchy   ASSESSMENT/OUTCOME:  Jamie Lutz presented as happy and minimally anxious. She reported that she tried to go for another run and her anxiety made her go home. She reports not knowing exactly what triggered this anxiety, because she was with her friend and she enjoys running.   Jadda participated in guided meditation video with this [facility] and said it was helpful for her. Mahlani then created a hierarchy of anxiety producing situations. This [facility] discussed the usefulness of creating this scale and how it will be used in subsequent sessions.   Jamie Lutz also mentioned that when she feels anxious, she also often feels the need to use the bathroom. She reports that occasionally going to the bathroom makes her feel slightly better.    PLAN:  Jamie Lutz will write down details about when she feels anxiety (time, preceding event, if bathroom helped, intensity) Jamie Lutz will use guided meditation and deep breathing when feeling stressed  Scheduled next visit: 11/24/2014 @ 10:30am   Tana ConchMadeleine Morris Behavioral Health Intern, [facility]

## 2014-11-22 ENCOUNTER — Encounter: Payer: No Typology Code available for payment source | Admitting: Physical Therapy

## 2014-11-24 ENCOUNTER — Ambulatory Visit (INDEPENDENT_AMBULATORY_CARE_PROVIDER_SITE_OTHER): Payer: No Typology Code available for payment source | Admitting: Licensed Clinical Social Worker

## 2014-11-24 ENCOUNTER — Ambulatory Visit: Payer: Self-pay | Admitting: Pediatrics

## 2014-11-24 DIAGNOSIS — F418 Other specified anxiety disorders: Secondary | ICD-10-CM

## 2014-11-24 NOTE — BH Specialist Note (Signed)
Referring Provider: Dory PeruBROWN,KIRSTEN R, MD Session Time:  1030 - 1130 (60 minutes) Type of Service: Behavioral Health - Individual Interpreter: No.  Interpreter Name & Language: N/A Both this [facility] and [facility] L. Fraser Dinreston were present for this visit.  COMPREHENSIVE CLINICAL ASSESSMENT  PRESENTING CONCERNS:   Jamie Lutz is a 19 y.o. female brought in by self. [facility].   GOALS ADDRESSED:  Reduce overall frequency, intensity, and duration of the anxiety so that daily functioning is not impaired Stabilize anxiety level while increasing ability to function on a daily basis Enhance ability to effectively cope with the full variety of lifes anxieties    INTERVENTIONS:  Assessed current condition/needs Built rapport Provided psycho education Stress management Anxiety hierarchy Guided meditation   ASSESSMENT/OUTCOME:  Jamie Lutz presented as happy and minimally anxious. She reported that she did not have a good week. She described going to [employer] with her mom on Monday and having a panic attack (described by pt as intense urgency to use restroom, feeling weak and shakey). She has not left the house since then, and reports feeling overwhelmed and depressed because of her health and anxiety.   Jamie Lutz did not return tracking homework assignment from last week, but she reports that she started to on Monday and had nothing else to report because she didn't go out of the house since then.   Jamie Lutz disclosed that her anxiety is always accompanied by urgency to urinate, and this has been the case since the birth of her son 3 years ago.   [name] participated in guided meditation, and reported that it was helpful for her. She continues to use deep breathing to help calm herself down.   [facility] L. Fraser Dinreston shared information with Jamie Lutz about essential oils, and provided her with a cotton ball dabbed with orange/ginger scent.  See note.   PLAN:  Jamie Lutz will write down details about when she feels anxiety (time, preceding event, if bathroom helped, intensity) using the Mindshift app Jamie Lutz will use guided meditation and deep breathing when feeling stressed  Scheduled next visit: 12/08/2014 @ 9:00am   Tana ConchMadeleine Morris Behavioral Health Intern, [facility]

## 2014-11-24 NOTE — BH Specialist Note (Signed)
Aromatherapy was used or provided to the patient.   Essential oil given: orange ginger Qty: 1 drop   Exp. Date: 11-22-15  The patient has been instructed regarding safe and effective use of essential oils:   Gentle inhalation only  Do not swallow, apply to skin, or get near eyes  Avoid exposure to children, pets, or individuals who may be sensitive   Patient or guardian verbalized understanding by repeating back concepts discussed and signed consent sheet for verification.    Patient is seeking extra relaxation and relief from tight stomach muscles. She expressed interest today and took information on [facility] recommended essential oil provider, Britta MccreedyJulie Jones.  Samples prepared by: Cathlean CowerPRESTON,Stephone Gum R [facility] Approved Aromatherapy Clinician 11:23 AM 11/24/2014

## 2014-11-29 ENCOUNTER — Encounter: Payer: No Typology Code available for payment source | Admitting: Physical Therapy

## 2014-12-06 ENCOUNTER — Encounter: Payer: No Typology Code available for payment source | Admitting: Physical Therapy

## 2014-12-07 ENCOUNTER — Encounter: Payer: Self-pay | Admitting: Pediatrics

## 2014-12-07 NOTE — Progress Notes (Signed)
Pre-Visit Planning  Jamie Lutz  is a 19 y.o. female referred by Jamie Lutz,Jamie Lutz, Jamie Lutz.   Last seen in [facility] on 11/03/2014 for chronic abdominal pain and anxiety.  Pt had PT for myofascial abdominal pain but that was discontinued at this point.  Pt continues to have urinary symptoms and thus urogyn eval may be indicated.  Pt is seeing Leta SpellerLauren Preston for ongoing BH interventions.  Previous Psych Screenings?  Yes PHQSADs 11/03/2014  Treatment plan at last visit included [facility].  Consider urogyn referral if persistent symptoms   Clinical Staff Visit Tasks:   - Urine GC/CT due? no - Psych Screenings Due? No - Urine clean catch if any urinary symptoms  Provider Visit Tasks: - Assess urogyn symptoms, consider urogyn referral - Assess anxiety - [facility] involvement indicated  - Pertinent Labs? No

## 2014-12-08 ENCOUNTER — Ambulatory Visit (INDEPENDENT_AMBULATORY_CARE_PROVIDER_SITE_OTHER): Payer: No Typology Code available for payment source | Admitting: Licensed Clinical Social Worker

## 2014-12-08 ENCOUNTER — Encounter: Payer: Self-pay | Admitting: Pediatrics

## 2014-12-08 ENCOUNTER — Ambulatory Visit (INDEPENDENT_AMBULATORY_CARE_PROVIDER_SITE_OTHER): Payer: No Typology Code available for payment source | Admitting: Pediatrics

## 2014-12-08 VITALS — BP 106/70 | HR 83 | Ht 64.0 in | Wt 137.0 lb

## 2014-12-08 DIAGNOSIS — G8929 Other chronic pain: Secondary | ICD-10-CM

## 2014-12-08 DIAGNOSIS — R350 Frequency of micturition: Secondary | ICD-10-CM | POA: Insufficient documentation

## 2014-12-08 DIAGNOSIS — R822 Biliuria: Secondary | ICD-10-CM

## 2014-12-08 DIAGNOSIS — R109 Unspecified abdominal pain: Secondary | ICD-10-CM

## 2014-12-08 DIAGNOSIS — Z1389 Encounter for screening for other disorder: Secondary | ICD-10-CM

## 2014-12-08 DIAGNOSIS — F418 Other specified anxiety disorders: Secondary | ICD-10-CM

## 2014-12-08 DIAGNOSIS — F4322 Adjustment disorder with anxiety: Secondary | ICD-10-CM

## 2014-12-08 LAB — CBC WITH DIFFERENTIAL/PLATELET
BASOS PCT: 0 % (ref 0–1)
Basophils Absolute: 0 10*3/uL (ref 0.0–0.1)
EOS ABS: 0.1 10*3/uL (ref 0.0–0.7)
EOS PCT: 1 % (ref 0–5)
HCT: 40.7 % (ref 36.0–46.0)
Hemoglobin: 14.4 g/dL (ref 12.0–15.0)
LYMPHS ABS: 1.9 10*3/uL (ref 0.7–4.0)
Lymphocytes Relative: 33 % (ref 12–46)
MCH: 29.4 pg (ref 26.0–34.0)
MCHC: 35.4 g/dL (ref 30.0–36.0)
MCV: 83.2 fL (ref 78.0–100.0)
MONO ABS: 0.5 10*3/uL (ref 0.1–1.0)
MONOS PCT: 9 % (ref 3–12)
MPV: 11.6 fL (ref 8.6–12.4)
Neutro Abs: 3.3 10*3/uL (ref 1.7–7.7)
Neutrophils Relative %: 57 % (ref 43–77)
PLATELETS: 192 10*3/uL (ref 150–400)
RBC: 4.89 MIL/uL (ref 3.87–5.11)
RDW: 14 % (ref 11.5–15.5)
WBC: 5.8 10*3/uL (ref 4.0–10.5)

## 2014-12-08 LAB — POCT URINALYSIS DIPSTICK
Blood, UA: NEGATIVE
Glucose, UA: NORMAL
LEUKOCYTES UA: NEGATIVE
NITRITE UA: NEGATIVE
Spec Grav, UA: 1.02
UROBILINOGEN UA: NEGATIVE
pH, UA: 5

## 2014-12-08 LAB — COMPREHENSIVE METABOLIC PANEL
ALK PHOS: 78 U/L (ref 33–115)
ALT: 21 U/L (ref 6–29)
AST: 21 U/L (ref 10–30)
Albumin: 4.6 g/dL (ref 3.6–5.1)
BUN: 12 mg/dL (ref 7–25)
CO2: 23 mmol/L (ref 20–31)
CREATININE: 0.63 mg/dL (ref 0.50–1.10)
Calcium: 9.3 mg/dL (ref 8.6–10.2)
Chloride: 104 mmol/L (ref 98–110)
GLUCOSE: 84 mg/dL (ref 65–99)
Potassium: 4.2 mmol/L (ref 3.5–5.3)
SODIUM: 137 mmol/L (ref 135–146)
TOTAL PROTEIN: 7.4 g/dL (ref 6.1–8.1)
Total Bilirubin: 0.6 mg/dL (ref 0.2–1.2)

## 2014-12-08 MED ORDER — FLUOXETINE HCL 10 MG PO CAPS: 10.0000 mg | ORAL_CAPSULE | Freq: Every day | ORAL | Status: DC

## 2014-12-08 NOTE — Patient Instructions (Signed)
We are going to start fluoxetine to treat your anxiety.  Take one capsule every day until your next appointment with Maddie.  We might increase it at that visit.  We will check labs today and I will let you know the results through my chart.  We will work on scheduling an appointment with a Urologist to check your urinary system.

## 2014-12-08 NOTE — Progress Notes (Signed)
THIS RECORD MAY CONTAIN CONFIDENTIAL INFORMATION THAT SHOULD NOT BE RELEASED WITHOUT REVIEW OF THE SERVICE PROVIDER.  Adolescent Medicine Consultation Follow-Up Visit Jamie Lutz  is a 19 y.o. female referred by Jonetta OsgoodBrown, Kirsten, MD here today for follow-up.    My Chart Activated?   yes   Previsit planning completed:   Pre-Visit Planning  Jamie Lutz  is a 19 y.o. female referred by Dory PeruBROWN,KIRSTEN R, MD.   Last seen in [facility] on 11/03/2014 for chronic abdominal pain and anxiety.  Pt had PT for myofascial abdominal pain but that was discontinued at this point.  Pt continues to have urinary symptoms and thus urogyn eval may be indicated.  Pt is seeing Leta SpellerLauren Preston for ongoing BH interventions.  Previous Psych Screenings?  Yes PHQSADs 11/03/2014  Treatment plan at last visit included continue work with PT and [facility].  Consider urogyn referral if persistent symptoms   Clinical Staff Visit Tasks:   - Urine GC/CT due? no - Psych Screenings Due? No - Urine clean catch if any urinary symptoms  Provider Visit Tasks: - Assess urogyn symptoms, consider urogyn referral - Assess anxiety - [facility] involvement indicated  - Pertinent Labs? No  Growth Chart Viewed? not applicable   History was provided by the patient.  PCP Confirmed?  yes  HPI:    Does not get LLQ pain as much Did take all of the medication for PID   Complains of urinary frequency, sometimes has dysuria Reports 1-2 stools daily, denies constipation or hard stool  Complains of heavy feeling in her head, discussed this might be anxiety Interested in medications for anxiety, feels like she would benefit more from therapy if she took medication  Previously had nexplanon but removed due to heavy bleeding.  Was also having a lot of anxiety with it in and is worried that might get worse.  Patient's last menstrual period was 11/15/2014. No Known Allergies No current outpatient prescriptions on  file prior to visit.   No current facility-administered medications on file prior to visit.   Social History: Exercise:  Had been exercising but now has not been as much due to the weather Sleep:  has difficulty falling asleep  Confidentiality was discussed with the patient and if applicable, with caregiver as well.  Partner preference?  female Sexually Active?  yes   Pregnancy Prevention:  condoms, reviewed condoms & plan B  The following portions of the patient's history were reviewed and updated as appropriate: allergies, current medications, past social history and problem list.  Physical Exam:  Filed Vitals:   12/08/14 1050  BP: 106/70  Pulse: 83  Height: 5\' 4"  (1.626 m)  Weight: 137 lb (62.143 kg)   BP 106/70 mmHg  Pulse 83  Ht 5\' 4"  (1.626 m)  Wt 137 lb (62.143 kg)  BMI 23.50 kg/m2  LMP 11/15/2014 Body mass index: body mass index is 23.5 kg/(m^2). Facility age limit for growth percentiles is 20 years.  Physical Exam  Constitutional: No distress.  Neck: No thyromegaly present.  Cardiovascular: Normal rate and regular rhythm.   No murmur heard. Pulmonary/Chest: Breath sounds normal.  Abdominal: Soft. There is tenderness (midline, suprapubic). There is no rebound and no guarding.  Musculoskeletal: She exhibits no edema.  Lymphadenopathy:    She has no cervical adenopathy.  Neurological: She is alert.  No Tremor   Nursing note and vitals reviewed.  PHQ-SADS 12/08/2014 11/03/2014  PHQ-15 10 4   GAD-7 12 4   PHQ-9 9 3  Comment Very Difficult somewhat difficult   Assessment/Plan: 1. Chronic abdominal pain 2. Urinary frequency Overall improved but continues to be suprapubic pain on palpation.  Unclear if this is organic or related to anxiety.  Will move forward with labs and with urology referral.  However, advised can cancel urology appt if patient responds with improvement in somatic like symptoms with start of SSRI today. - Comprehensive metabolic panel - CBC  with Differential/Platelet - Ambulatory referral to Urology  3. Bilirubinuria - Comprehensive metabolic panel - CBC with Differential/Platelet  4. Adjustment disorder with anxious mood  Reviewed side effects, risks and benefits of prozac.  Increase to 20 mg in 1-2 weeks if no side effects.    - FLUoxetine (PROZAC) 10 MG capsule; Take 1 capsule (10 mg total) by mouth daily.  Dispense: 30 capsule; Refill: 1  5. Screening for genitourinary condition - POCT urinalysis dipstick   Follow-up:  Return in about 1 month (around 01/07/2015) for Med f/u, with any available [facility] Provider.   Medical decision-making:  > 25 minutes spent, more than 50% of appointment was spent discussing diagnosis and management of symptoms

## 2014-12-08 NOTE — BH Specialist Note (Signed)
Referring Provider: Dory PeruBROWN,KIRSTEN R, MD Session Time:  [phone] - 1000 (14 minutes) Type of Service: Behavioral Health - Individual Interpreter: No.  Interpreter Name & Language: N/A   PRESENTING CONCERNS:   Jamie Lutz is a 19 y.o. female brought in by self. [facility] and [facility].   GOALS ADDRESSED:  Reduce overall frequency, intensity, and duration of the anxiety so that daily functioning is not impaired Stabilize anxiety level while increasing ability to function on a daily basis Enhance ability to effectively cope with the full variety of lifes anxieties    INTERVENTIONS:  Assessed current condition/needs Built rapport Provided psychoeducation    ASSESSMENT/OUTCOME:  Jamie Lutz mixed up the time of this visit and arrived late. She reported that she has not been functioning well and that she has not been leaving the house in the last two weeks very often. She indicated that she is not feeling well in general and that her hand is tingling and keeps her up at night.   She stated that she tracked her anxiety since last visit but forgot to bring the paper to this session.   She stated that she has been using deep breathing and meditation to relax with varied success. This [facility] provided psychoeducation around mindfulness and grounding techniques.    PLAN:  Jamie Lutz will use guided meditation and deep breathing when feeling stressed Jamie Lutz will practice grounding technique (5 senses)  This [facility] will check in on med changes and any notable side effects/how it is affecting her mood  Scheduled next visit: 12/20/2014 @ 3:00pm   Tana ConchMadeleine Morris Behavioral Health Intern, [facility]

## 2014-12-10 ENCOUNTER — Encounter: Payer: Self-pay | Admitting: Pediatrics

## 2014-12-20 ENCOUNTER — Ambulatory Visit: Payer: No Typology Code available for payment source | Admitting: Clinical

## 2014-12-28 ENCOUNTER — Telehealth: Payer: Self-pay

## 2014-12-28 NOTE — Telephone Encounter (Signed)
Pt sent a request through my Chart to schedule an appt. lvm to call back. Pt is already schd with Dr. Marina GoodellPerry on 01/10/15.

## 2014-12-29 ENCOUNTER — Ambulatory Visit: Payer: No Typology Code available for payment source | Admitting: Clinical

## 2015-01-09 ENCOUNTER — Encounter: Payer: Self-pay | Admitting: Pediatrics

## 2015-01-09 NOTE — Progress Notes (Signed)
Pre-Visit Planning  Jamie Lutz  is a 19 y.o. female referred by Royston Cowper, MD.   Last seen in [facility] on 12/08/2014 for chronic abdn pain, urinary frequency, anxiety.   Previous Psych Screenings? yes, PHQSADs, 12/08/2014  Treatment plan at last visit included labs checked, referral to urology, prozac started.     Clinical Staff Visit Tasks:   - Urine GC/CT due? no - Psych Screenings Due? yes, PHQSADs - Urine dipstick if any urinary symptoms  Provider Visit Tasks: - Review lab results - Review urology referral - Assess medication compliance, benefits and side effects  - Assess anxiety - [facility] Involvement? Yes - Pertinent Labs? Yes Component     Latest Ref Rng 12/08/2014  WBC     4.0 - 10.5 K/uL 5.8  RBC     3.87 - 5.11 MIL/uL 4.89  Hemoglobin     12.0 - 15.0 g/dL 14.4  HCT     36.0 - 46.0 % 40.7  MCV     78.0 - 100.0 fL 83.2  MCH     26.0 - 34.0 pg 29.4  MCHC     30.0 - 36.0 g/dL 35.4  RDW     11.5 - 15.5 % 14.0  Platelets     150 - 400 K/uL 192  MPV     8.6 - 12.4 fL 11.6  Neutrophils     43 - 77 % 57  NEUT#     1.7 - 7.7 K/uL 3.3  Lymphocytes     12 - 46 % 33  Lymphocyte #     0.7 - 4.0 K/uL 1.9  Monocytes Relative     3 - 12 % 9  Monocyte #     0.1 - 1.0 K/uL 0.5  Eosinophil     0 - 5 % 1  Eosinophils Absolute     0.0 - 0.7 K/uL 0.1  Basophil     0 - 1 % 0  Basophils Absolute     0.0 - 0.1 K/uL 0.0  Smear Review      Criteria for review not met  Sodium     135 - 146 mmol/L 137  Potassium     3.5 - 5.3 mmol/L 4.2  Chloride     98 - 110 mmol/L 104  CO2     20 - 31 mmol/L 23  Glucose     65 - 99 mg/dL 84  BUN     7 - 25 mg/dL 12  Creatinine     0.50 - 1.10 mg/dL 0.63  Total Bilirubin     0.2 - 1.2 mg/dL 0.6  Alkaline Phosphatase     33 - 115 U/L 78  AST     10 - 30 U/L 21  ALT     6 - 29 U/L 21  Total Protein     6.1 - 8.1 g/dL 7.4  Albumin     3.6 - 5.1 g/dL 4.6  Calcium     8.6 - 10.2 mg/dL  9.3  Color, UA      yellow  Clarity, UA      clear  Glucose      normal  Bilirubin, UA      +  Ketones, UA      trace  Specific Gravity, UA      1.020  RBC, UA      neg  pH, UA      5.0  Protein, UA  trace  Urobilinogen, UA      negative  Nitrite, UA      neg  Leukocytes, UA     Negative Negative

## 2015-01-10 ENCOUNTER — Ambulatory Visit: Payer: No Typology Code available for payment source | Admitting: Pediatrics

## 2015-01-12 ENCOUNTER — Other Ambulatory Visit: Payer: Self-pay | Admitting: Pediatrics

## 2015-01-12 ENCOUNTER — Ambulatory Visit (INDEPENDENT_AMBULATORY_CARE_PROVIDER_SITE_OTHER): Payer: No Typology Code available for payment source | Admitting: Clinical

## 2015-01-12 DIAGNOSIS — F418 Other specified anxiety disorders: Secondary | ICD-10-CM

## 2015-01-12 DIAGNOSIS — F4322 Adjustment disorder with anxiety: Secondary | ICD-10-CM

## 2015-01-12 MED ORDER — FLUOXETINE HCL 20 MG PO CAPS: 20.0000 mg | ORAL_CAPSULE | Freq: Every day | ORAL | Status: DC

## 2015-01-12 NOTE — BH Specialist Note (Signed)
Referring Provider: Dory PeruBROWN,KIRSTEN R, MD Session Time:  1120 - 1205 (45 minutes) Type of Service: Behavioral Health - Individual Interpreter: No.  Interpreter Name & Language: N/A   PRESENTING CONCERNS:   Jamie Lutz is a 19 y.o. female brought in by self. [facility]  [name] reported ongoing symptoms of anxiety.  She also reported that she's woken up twice, around 4 am feeling her heart racing/pounding.   GOALS ADDRESSED:  Reduce overall frequency, intensity, and duration of the anxiety so that daily functioning is not impaired   INTERVENTIONS:  Assessed current condition/needs Built rapport since [facility] was not available today Reviewed PHQ-SADS Provided ongoing psycho education on anxiety symptoms and positive coping skills Education on progressive muscle relaxation & practiced it during the visit  PHQ-SADS 01/12/2015  PHQ-15 10  GAD-7 13  PHQ-9 11  Suicidal Ideation No  Comment Yes to anxiety attacks and "Very Difficult" to complete ADL   PHQ-SADS 12/08/2014  PHQ-15 10  GAD-7 12  PHQ-9 9  Suicidal Ideation No  Comment Very Difficult     ASSESSMENT/OUTCOME:  [name] presented to be well-groomed and had an anxious affect.  [name] reported a slight increase of anxiety & depressive symptoms on the PHQ-SADS from the previous visit.  [name] reported that the 10 mg Fluoxetine hasn't been as effective as she thought it would be.  [name] reported she has been using deep breathing techniques and valerian tea to help her feel more relaxed.  [name] actively participated in progressive muscle relaxation exercise and was given written information to practice it at home.  [name] reported she will practice one relaxation technique once a day.  This [facility] consulted with Candida Peeling. Hacker, FNP regarding Suleika's medication, Fluoxetine, and discussed the slight increase of symptoms.  FNP recommended increase of  Fluoxetine to 20 mg.  FNP provided prescription to the pharmacy.  [name] was informed she can take two 10 mg tablets starting tonight.  [name] was informed to call [facility] 24/7 if she notices any side effects from the increase of medication.  [facility] also consulted with Dr. Manson PasseyBrown, PCP, and patient was given information on Lemon Balm tea.   PLAN:  [name] will practice one relaxation technique once a day including progressive muscle relaxation technique. Follow up on med changes and any notable side effects/how it is affecting her mood  Scheduled next visits: 01/17/16 with Wilfred LacyJ. Williams, [facility] 01/25/15 with K. Manson PasseyBrown, MD PCP Visit 02/01/15 with Idamae LusherM. Perry, MD Med Mgmt   Jasmine P. Mayford KnifeWilliams, MSW, LCSW Lead Behavioral Health Clinician [facility] Office Tel: [phone] Fax: [phone]

## 2015-01-17 ENCOUNTER — Ambulatory Visit (INDEPENDENT_AMBULATORY_CARE_PROVIDER_SITE_OTHER): Payer: No Typology Code available for payment source | Admitting: Clinical

## 2015-01-17 DIAGNOSIS — F418 Other specified anxiety disorders: Secondary | ICD-10-CM

## 2015-01-17 NOTE — BH Specialist Note (Signed)
Referring Provider: Dory PeruBROWN,KIRSTEN R, MD Session Time:  1610:  1204 - 1235  (31 Minutes) Type of Service: Behavioral Health - Individual Interpreter: No.  Interpreter Name & Language: N/A   PRESENTING CONCERNS:   Jamie Lutz is a 19 y.o. female brought in by self. [facility] and [facility].  Jamie Lutz reported ongoing symptoms of anxiety.    Today, Jamie Lutz reported no pain but difficulty sleeping at night.   GOALS ADDRESSED:  Reduce overall frequency, intensity, and duration of the anxiety so that daily functioning is not impaired   INTERVENTIONS:  Assessed current condition/needs Assessed SE of meds/symptoms Reviewed PHQ-SADS & identified improvement with symptoms Provided ongoing psycho education on anxiety symptoms and positive coping skills   PHQ-SADS 01/17/2015 01/12/2015  PHQ-15 5 10   GAD-7 9 13   PHQ-9 6 11   Suicidal Ideation No No    ASSESSMENT/OUTCOME:  Jamie Lutz presented to be well-groomed and had a positive affect.  Jamie Lutz reported no side effects with the increase of the FLUoexetine. She reported a decrease in symptoms on the PHQ-SADS.  Jamie Lutz was open to strategies to improve her sleep.  Jamie Lutz has been having difficulty going to sleep at night, taking around 30 minutes to fall asleep and at times wakes up in the middle of the night.  Jamie Lutz did not do any relaxation techniques but did have a set routine before bedtime.  Jamie Lutz will utilize the various relaxation exercises she's learned including PMR & visualizations.  Jamie Lutz also identified one positive statement she can say to herself when she goes to the store today since she was nervous about going to the store she's never been to today.   PLAN:  Jamie Lutz will practice one relaxation technique before bedtime to help her fall asleep.  Scheduled next visits: 01/25/15 with K. Manson PasseyBrown, MD PCP Visit 02/01/15 with Idamae LusherM. Perry, MD Med Mgmt & Joint [facility]   Niani Mourer P.  Mayford KnifeWilliams, MSW, LCSW Lead Behavioral Health Clinician [facility] Office Tel: [phone] Fax: [phone]

## 2015-01-17 NOTE — Patient Instructions (Signed)
To help with Sleep:  Practice one relaxation exercise (Progressive Muscle Relaxation or Deep breathing)

## 2015-01-23 ENCOUNTER — Other Ambulatory Visit: Payer: Self-pay | Admitting: Pediatrics

## 2015-01-25 ENCOUNTER — Encounter: Payer: Self-pay | Admitting: Pediatrics

## 2015-01-25 ENCOUNTER — Ambulatory Visit (INDEPENDENT_AMBULATORY_CARE_PROVIDER_SITE_OTHER): Payer: No Typology Code available for payment source | Admitting: Pediatrics

## 2015-01-25 VITALS — BP 102/80 | Ht 64.0 in | Wt 135.2 lb

## 2015-01-25 DIAGNOSIS — R109 Unspecified abdominal pain: Secondary | ICD-10-CM

## 2015-01-25 DIAGNOSIS — Z114 Encounter for screening for human immunodeficiency virus [HIV]: Secondary | ICD-10-CM

## 2015-01-25 DIAGNOSIS — Z68.41 Body mass index (BMI) pediatric, 5th percentile to less than 85th percentile for age: Secondary | ICD-10-CM

## 2015-01-25 DIAGNOSIS — Z0289 Encounter for other administrative examinations: Secondary | ICD-10-CM

## 2015-01-25 DIAGNOSIS — G8929 Other chronic pain: Secondary | ICD-10-CM

## 2015-01-25 DIAGNOSIS — F4322 Adjustment disorder with anxiety: Secondary | ICD-10-CM

## 2015-01-25 LAB — POCT RAPID HIV: Rapid HIV, POC: NEGATIVE

## 2015-01-25 NOTE — Patient Instructions (Signed)
If lemon balm is not working, stop it and consider trying motherwort.  It is not a good tea, but buy it in tincture or capsules. Take it morning and evening.  Be careful combing several herbs at once. Seven blossoms tea also has valerian and passionflower, so it can make you a little drowsy. Make sure you do not try them for the first time right before you need to go somewhere.    Dental list         Updated 7.28.16 These dentists all accept Medicaid.  The list is for your convenience in choosing your child's dentist. Estos dentistas aceptan Medicaid.  La lista es para su [place]amconveniencia y es una cortesa.     [facility]     [phone] [address]  [address] [place] [address] Se habla espaol From 841 to 68522 years old Parent may go with child only for cleaning [employer] FoodsBryan Cobb DDS     [phone] [address] [place]  [address] Se habla espaol From 802 to 22 years old Parent may NOT go with child  Marolyn HammockSilva and Silva DMD    [phone] [address] [place] [address] Se habla espaol [place]namese spoken From 22 years old Parent may go with child [facility]     [phone] [address] [place] [address] Se habla espaol From 451 to 22 years old Parent may NOT go with child  Winfield Rasthane Hisaw DDS     [phone] [facility]     [address]  [place] [address] From teeth coming in - 22 years old Parent may go with child  [facility]     [phone] [address] [place] [address] Requires certification. Call for information. Requiere certificacin. Llame para informacin. Algunos dias se habla espaol  From birth to 20 years Parent possibly goes with child  Bradd CanaryHerbert McNeal DDS     [phone] [phone] [address]  [address] [place] [address] Se habla espaol From 18 months to 18 years  Parent may go with child  J. ChaunceyHoward McMasters DDS    [phone] Garlon HatchetEric J. Sadler  DDS [address] [place] [address] Se habla espaol From 22 year old Parent may go with child  Melynda Rippleerry Jeffries DDS    [phone] [address] [place] [address] Se habla espaol  From 3918 months - 22 years old Parent may go with child Dorian PodJ. Selig Cooper DDS    [phone] [address] [place] [address] Se habla espaol From 575 to 22 years old Parent may go with child  [facility]    [phone] [address] [place] [address] No se habla espaol From birth Parent may not go with child

## 2015-01-25 NOTE — Progress Notes (Signed)
Routine Well-Adolescent Visit  PCP: Dory Peru, MD   History was provided by the patient.  Jamie Lutz is a 20 y.o. female who is here for routine PE.  Current concerns:   H/o anxiety - followed by adolescent medicine and also [facility].  Is now on fluoxetine with recent increased in dose to 30 mg daily. Jamie Lutz reports that she feels better most days but still has anxious feelings.  Has follow up appt scheduled later this month.  Jamie Lutz is also interested in herbs for anxiety - she has tried lemon balm but did not feel that it helped her very much.  She uses valerian tea (doesn't seem to mind the flavor) daily, which helps the symtpoms of worry. She has also tried "siete azahares" tea (mix of passionflower, linden, valerian, citrus blossoms) which she likes. Does not find that either of these teas makes her particularly drowsy.  Also using the self-calm/coping strategies taught to her by [facility] and finds them helpful.   H/o lower pelvic pain - has been referred to urogyn and has appt later this month.   Adolescent Assessment:  Confidentiality was discussed with the patient and if applicable, with caregiver as well.  Home and Environment:  Lives with: lives at home with her two young sons, mother, several of her siblings Parental relations: good Nutrition/Eating Behaviors: appetite waxes and wanes but overall eats well Sports/Exercise:  no  Education and Employment:  School Status: has graduated Work: no Activities: cares for children, helps with her siblings  Patient reports being comfortable and safe at school and at home? Yes  Smoking: no Secondhand smoke exposure? no Drugs/EtOH: no   Menstruation:   No current concerns regarding menstruation. Not curently sexually active - last time was 3 months ago.No current contraception other than abstinence. Previously had Nexplanon, but removed after about one year due to intermittent bleeding.  Does not feel that OCPs would be  a good fit for her and does not want IUD. Would consider placing Nexplanon again but is unsure today.   Last STI Screening: October 2016  Violence/Abuse: no Mood: Suicidality and Depression: anxiety - followed by Dr Marina Goodell Weapons: no  Screenings: Completed PHQ-SADS PHQ-SADS 01/27/2015  PHQ-15 7  GAD-7 11  PHQ-9 10  Suicidal Ideation No  Comment yes to anxiety attacks and very difficult to complete ADLs    Physical Exam:  BP 102/80 mmHg  Ht 5\' 4"  (1.626 m)  Wt 135 lb 3.2 oz (61.326 kg)  BMI 23.20 kg/m2  LMP 01/19/2015 Facility age limit for growth percentiles is 20 years.  Physical Exam  Constitutional: She appears well-developed and well-nourished. No distress.  HENT:  Head: Normocephalic.  Right Ear: Tympanic membrane, external ear and ear canal normal.  Left Ear: Tympanic membrane, external ear and ear canal normal.  Nose: Nose normal.  Mouth/Throat: Oropharynx is clear and moist. No oropharyngeal exudate.  Eyes: Conjunctivae and EOM are normal. Pupils are equal, round, and reactive to light.  Neck: Normal range of motion. Neck supple. No thyromegaly present.  Cardiovascular: Normal rate, regular rhythm and normal heart sounds.   No murmur heard. Pulmonary/Chest: Effort normal and breath sounds normal.  Abdominal: Soft. Bowel sounds are normal. She exhibits no distension and no mass. There is no tenderness.  Genitourinary:  Tanner Stage 5  Musculoskeletal: Normal range of motion.  Lymphadenopathy:    She has no cervical adenopathy.  Neurological: She is alert. No cranial nerve deficit.  Skin: Skin is warm and dry. No rash  noted.  Psychiatric: She has a normal mood and affect.  Nursing note and vitals reviewed.   Assessment/Plan:  1. Encounter for other specified administrative purpose Routine PE - given age would be due pap smear, but late to visit today and our side of clinic not well set up to do them. Has upcoming appts with adolescent and gynecology - can be  done through one of them. Will also need to establish with adult medicine within the next year.   2. Body mass index, pediatric, 5th percentile to less than 85th percentile for age Healthy BMI. REviewed healthy eating and lifestyle habits  3. Screening for HIV (human immunodeficiency virus) - POCT Rapid HIV  4. Chronic abdominal pain Has pending referral to urogynecology  5. Adjustment disorder with anxious mood [facility]. Extensive discussion regarding use of herbs to supplement anxiety treatment. Does not seem to be drowsy with either valerian or passionflower and feels that they have positive effects for her. Cautioned against combining the valerian with the siete azahares tea, or if she wants to try it to do so on a day when she does not have to drive anywhere (risk for drowsiness). Could consider trying motherwort for symptoms. Not a good tea, but readily available in tincture and tablet forms. Would not recommend [facility] Wort given that she is on an SSRI.   PHQ-SADS essentially the same as previous although maybe slightly better since 01/12/15.   6. Contraceptive managmenet - not currently sexually active but would be interested in having Nexplanon replaced. Information given. She will think about it and make a decision before her upcoming adolescent appt.   BMI: is appropriate for age  Immunizations today: per orders.  Did not schedule follow up with me since she is already established with adolescent medicine and will soon need to transition to adult care. However, if she would like to take more about herbs for anxiety, I would be happy to see her back.   Dory PeruBROWN,Amylia Collazos R, MD

## 2015-02-01 ENCOUNTER — Encounter: Payer: No Typology Code available for payment source | Admitting: Clinical

## 2015-02-01 ENCOUNTER — Ambulatory Visit: Payer: No Typology Code available for payment source | Admitting: Pediatrics

## 2015-02-07 ENCOUNTER — Ambulatory Visit: Payer: No Typology Code available for payment source | Admitting: Urology

## 2015-02-21 ENCOUNTER — Ambulatory Visit: Payer: Self-pay | Admitting: Pediatrics

## 2015-02-21 ENCOUNTER — Encounter: Payer: No Typology Code available for payment source | Admitting: Clinical

## 2015-03-07 ENCOUNTER — Ambulatory Visit (INDEPENDENT_AMBULATORY_CARE_PROVIDER_SITE_OTHER): Payer: No Typology Code available for payment source | Admitting: Urology

## 2015-03-07 DIAGNOSIS — N941 Unspecified dyspareunia: Secondary | ICD-10-CM

## 2015-03-07 DIAGNOSIS — M6289 Other specified disorders of muscle: Secondary | ICD-10-CM

## 2015-03-08 ENCOUNTER — Encounter: Payer: Self-pay | Admitting: Pediatrics

## 2015-03-08 NOTE — Progress Notes (Signed)
Pre-Visit Planning  Jamie Lutz  is a 20 y.o. female referred by Royston Cowper, MD.   Last seen in [facility] on 12/08/2014 for chronic abdn pain, urinary frequency, anxiety.   Previous Psych Screenings? yes, PHQSADs, 12/08/2014  Treatment plan at last visit included labs checked, referral to urology, prozac started.     Clinical Staff Visit Tasks:   - Urine GC/CT due? no - Psych Screenings Due? yes, PHQSADs - Urine dipstick if any urinary symptoms  Provider Visit Tasks: - Review lab results - Review urology referral - Assess medication compliance, benefits and side effects  - Assess anxiety - [facility] Involvement? Yes - Pertinent Labs? Yes Component     Latest Ref Rng 12/08/2014  WBC     4.0 - 10.5 K/uL 5.8  RBC     3.87 - 5.11 MIL/uL 4.89  Hemoglobin     12.0 - 15.0 g/dL 14.4  HCT     36.0 - 46.0 % 40.7  MCV     78.0 - 100.0 fL 83.2  MCH     26.0 - 34.0 pg 29.4  MCHC     30.0 - 36.0 g/dL 35.4  RDW     11.5 - 15.5 % 14.0  Platelets     150 - 400 K/uL 192  MPV     8.6 - 12.4 fL 11.6  Neutrophils     43 - 77 % 57  NEUT#     1.7 - 7.7 K/uL 3.3  Lymphocytes     12 - 46 % 33  Lymphocyte #     0.7 - 4.0 K/uL 1.9  Monocytes Relative     3 - 12 % 9  Monocyte #     0.1 - 1.0 K/uL 0.5  Eosinophil     0 - 5 % 1  Eosinophils Absolute     0.0 - 0.7 K/uL 0.1  Basophil     0 - 1 % 0  Basophils Absolute     0.0 - 0.1 K/uL 0.0  Smear Review      Criteria for review not met  Sodium     135 - 146 mmol/L 137  Potassium     3.5 - 5.3 mmol/L 4.2  Chloride     98 - 110 mmol/L 104  CO2     20 - 31 mmol/L 23  Glucose     65 - 99 mg/dL 84  BUN     7 - 25 mg/dL 12  Creatinine     0.50 - 1.10 mg/dL 0.63  Total Bilirubin     0.2 - 1.2 mg/dL 0.6  Alkaline Phosphatase     33 - 115 U/L 78  AST     10 - 30 U/L 21  ALT     6 - 29 U/L 21  Total Protein     6.1 - 8.1 g/dL 7.4  Albumin     3.6 - 5.1 g/dL 4.6  Calcium     8.6 - 10.2 mg/dL  9.3  Color, UA      yellow  Clarity, UA      clear  Glucose      normal  Bilirubin, UA      +  Ketones, UA      trace  Specific Gravity, UA      1.020  RBC, UA      neg  pH, UA      5.0  Protein, UA  trace  Urobilinogen, UA      negative  Nitrite, UA      neg  Leukocytes, UA     Negative Negative

## 2015-03-09 ENCOUNTER — Ambulatory Visit (INDEPENDENT_AMBULATORY_CARE_PROVIDER_SITE_OTHER): Payer: No Typology Code available for payment source | Admitting: Pediatrics

## 2015-03-09 ENCOUNTER — Ambulatory Visit (INDEPENDENT_AMBULATORY_CARE_PROVIDER_SITE_OTHER): Payer: No Typology Code available for payment source | Admitting: Clinical

## 2015-03-09 ENCOUNTER — Encounter: Payer: Self-pay | Admitting: Pediatrics

## 2015-03-09 VITALS — BP 102/64 | HR 70 | Ht 64.5 in | Wt 133.2 lb

## 2015-03-09 DIAGNOSIS — F4322 Adjustment disorder with anxiety: Secondary | ICD-10-CM

## 2015-03-09 MED ORDER — FLUOXETINE HCL 20 MG PO CAPS: 20.0000 mg | ORAL_CAPSULE | Freq: Every day | ORAL | Status: DC

## 2015-03-09 NOTE — Progress Notes (Signed)
THIS RECORD MAY CONTAIN CONFIDENTIAL INFORMATION THAT SHOULD NOT BE RELEASED WITHOUT REVIEW OF THE SERVICE PROVIDER.  Adolescent Medicine Consultation Follow-Up Visit Jamie Lutz  is a 20 y.o. female referred by Dillon Bjork, MD here today for follow-up.    Previsit planning completed:  yes Pre-Visit Planning  Jamie Lutz  is a 20 y.o. female referred by Royston Cowper, MD.   Last seen in [facility] on 12/08/2014 for chronic abdn pain, urinary frequency, anxiety.   Previous Psych Screenings? yes, PHQSADs, 12/08/2014  Treatment plan at last visit included labs checked, referral to urology, prozac started.     Clinical Staff Visit Tasks:   - Urine GC/CT due? no - Psych Screenings Due? yes, PHQSADs - Urine dipstick if any urinary symptoms  Provider Visit Tasks: - Review lab results - Review urology referral - Assess medication compliance, benefits and side effects  - Assess anxiety - [facility] Involvement? Yes - Pertinent Labs? Yes Component     Latest Ref Rng 12/08/2014  WBC     4.0 - 10.5 K/uL 5.8  RBC     3.87 - 5.11 MIL/uL 4.89  Hemoglobin     12.0 - 15.0 g/dL 14.4  HCT     36.0 - 46.0 % 40.7  MCV     78.0 - 100.0 fL 83.2  MCH     26.0 - 34.0 pg 29.4  MCHC     30.0 - 36.0 g/dL 35.4  RDW     11.5 - 15.5 % 14.0  Platelets     150 - 400 K/uL 192  MPV     8.6 - 12.4 fL 11.6  Neutrophils     43 - 77 % 57  NEUT#     1.7 - 7.7 K/uL 3.3  Lymphocytes     12 - 46 % 33  Lymphocyte #     0.7 - 4.0 K/uL 1.9  Monocytes Relative     3 - 12 % 9  Monocyte #     0.1 - 1.0 K/uL 0.5  Eosinophil     0 - 5 % 1  Eosinophils Absolute     0.0 - 0.7 K/uL 0.1  Basophil     0 - 1 % 0  Basophils Absolute     0.0 - 0.1 K/uL 0.0  Smear Review      Criteria for review not met  Sodium     135 - 146 mmol/L 137  Potassium     3.5 - 5.3 mmol/L 4.2  Chloride     98 - 110 mmol/L 104  CO2     20 - 31 mmol/L 23  Glucose     65 - 99 mg/dL 84   BUN     7 - 25 mg/dL 12  Creatinine     0.50 - 1.10 mg/dL 0.63  Total Bilirubin     0.2 - 1.2 mg/dL 0.6  Alkaline Phosphatase     33 - 115 U/L 78  AST     10 - 30 U/L 21  ALT     6 - 29 U/L 21  Total Protein     6.1 - 8.1 g/dL 7.4  Albumin     3.6 - 5.1 g/dL 4.6  Calcium     8.6 - 10.2 mg/dL 9.3  Color, UA      yellow  Clarity, UA      clear  Glucose      normal  Bilirubin, UA      +  Ketones, UA      trace  Specific Gravity, UA      1.020  RBC, UA      neg  pH, UA      5.0  Protein, UA      trace  Urobilinogen, UA      negative  Nitrite, UA      neg  Leukocytes, UA     Negative Negative   Growth Chart Viewed? not applicable   History was provided by the patient.  PCP Confirmed?  yes  My Chart Activated?   yes   HPI:    Symptoms of anxiety are decreased, depressive symptoms stable/slightly increased.  She is having difficulty sleeping.  Sleep hygiene addressed.  She continues to have anxiety about going to public places by herself.  Will start seeing [facility] on more regular basis to address anxiety and develop approach to work on sleep and getting out to public places.  Getting HAs last 2 weeks, last a few hours each day.  Taking muscle relaxant at night, from urologist, helping her with bladder/muscle spasm.  Started the medication 2 days ago.   Taking prozac consistently.  Has tried some things advised by Dr. Owens Shark that are herbal approaches.  No birth control now.  Not interested in nexplanon currently.  Patient's last menstrual period was 02/19/2015 (exact date). No Known Allergies Outpatient Encounter Prescriptions as of 03/09/2015  Medication Sig  . FLUoxetine (PROZAC) 20 MG capsule Take 1 capsule (20 mg total) by mouth daily.  . [DISCONTINUED] FLUoxetine (PROZAC) 20 MG capsule Take 1 capsule (20 mg total) by mouth daily.  . [DISCONTINUED] FLUoxetine (PROZAC) 10 MG capsule TAKE ONE CAPSULE BY MOUTH ONCE DAILY   No facility-administered encounter  medications on file as of 03/09/2015.     Patient Active Problem List   Diagnosis Date Noted  . Urinary frequency 12/08/2014  . Adjustment disorder with anxious mood 11/03/2014  . 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 Chronic abdominal pain 05/03/2014  . Constipation 05/03/2014  . Paresthesia 02/06/2014  . Teenage mother 02/03/2014    Social History   Social History Narrative     The following portions of the patient's history were reviewed and updated as appropriate: allergies, current medications and problem list.  Physical Exam:  Filed Vitals:   03/09/15 1140  BP: 102/64  Pulse: 70  Height: 5' 4.5" (1.638 m)  Weight: 133 lb 3.2 oz (60.419 kg)   BP 102/64 mmHg  Pulse 70  Ht 5' 4.5" (1.638 m)  Wt 133 lb 3.2 oz (60.419 kg)  BMI 22.52 kg/m2  LMP 02/19/2015 (Exact Date) Body mass index: body mass index is 22.52 kg/(m^2). Facility age limit for growth percentiles is 20 years.  Physical Exam  Constitutional: No distress.  Neck: No thyromegaly present.  Cardiovascular: Normal rate and regular rhythm.   No murmur heard. Pulmonary/Chest: Breath sounds normal.  Abdominal: Soft. There is no tenderness. There is no guarding.  Musculoskeletal: She exhibits no edema.  Lymphadenopathy:    She has no cervical adenopathy.  Neurological: She is alert.  Nursing note and vitals reviewed.  PHQ-SADS 03/28/2015  PHQ-15 5  GAD-7 7  PHQ-9 8  Suicidal Ideation No  Comment No panic attacks reported and somwhat difficult to complete ADL   PHQ-SADS 03/09/2015  PHQ-15 7  GAD-7 8  PHQ-9 11  Suicidal Ideation No  Comment Yes to anxiety attack 1x/week, "Somewhat difficult" to complete ADL   PHQ-SADS 01/27/2015  PHQ-15 7  GAD-7  11  PHQ-9 10  Suicidal Ideation No  Comment yes to anxiety attacks and very difficult to complete ADLs    Assessment/Plan: 1. Adjustment disorder with anxious mood - FLUoxetine (PROZAC) 20 MG capsule; Take 1 capsule (20 mg total) by mouth daily.   Dispense: 30 capsule; Refill: 1  Discussed that we will keep medication the same for 1 more month.  If continued HAs, difficulty sleeping and excess anxiety advised would consider increasing the dose of prozac or consider switch to remeron or other SSRI. Pt agreed to this plan.    Follow-up:  Return in about 1 month (around 04/06/2015) for Med f/u, with any available [facility] Provider.   Medical decision-making:  > 15 minutes spent, more than 50% of appointment was spent discussing diagnosis and management of symptoms

## 2015-03-13 NOTE — BH Specialist Note (Signed)
Referring Provider: Dory Peru, MD Session Time:  [phone] - 1205  (20 Minutes) Type of Service: Behavioral Health - Individual Interpreter: No.  Interpreter Name & Language: N/A   PRESENTING CONCERNS:   Jamie Lutz is a 20 y.o. female brought in by mother and self. Jamie Lutz was referred to [facility] for anxiety and chronic pain.  Jamie Lutz reported ongoing symptoms of anxiety.    Today, Jamie Lutz reported no pain but ongoing anxiety.     GOALS ADDRESSED:  Increase ability to go to the stores by herself by decreasing her anxiety symptoms as evidenced by self-report   INTERVENTIONS:  Assessed current condition/needs Assessed SE of meds/symptoms Reviewed PHQ-SADS & identified improvement with symptoms Practiced PMR Identified specific goals   PHQ-SADS 03/09/2015  PHQ-15 7  GAD-7 8  PHQ-9 11  Suicidal Ideation No  Comment Yes to anxiety attack 1x/week, "Somewhat difficult" to complete ADL     ASSESSMENT/OUTCOME:  Jamie Lutz presented to be casually dressed and had a positive affect.  Although Jamie Lutz reported improvement to her anxiety, she wants to be able to go to the stores by herself, instead of depending on others to go.  Jamie Lutz was able to identify positive coping skills she's been using and ongoing use of medication, as well as teas.  Jamie Lutz practiced PMR during the visit.  Jamie Lutz reported no side effects from current dose of Fluoxetine. She did report ongoing difficulty with going to sleep.  Jamie Lutz will practice PMR before bedtime to improve ability to fall asleep.   PLAN:  Practice PMR each night. Follow up with [name], [facility] intern to obtain other strategies so she can go to the stores by herself.   Jamie Lutz, MSW, LCSW Lead Behavioral Health Clinician [facility] Office Tel: [phone] Fax: [phone]

## 2015-03-16 ENCOUNTER — Ambulatory Visit: Payer: No Typology Code available for payment source | Admitting: Licensed Clinical Social Worker

## 2015-03-28 ENCOUNTER — Ambulatory Visit: Payer: No Typology Code available for payment source | Admitting: Licensed Clinical Social Worker

## 2015-03-28 ENCOUNTER — Ambulatory Visit (INDEPENDENT_AMBULATORY_CARE_PROVIDER_SITE_OTHER): Payer: No Typology Code available for payment source | Admitting: Licensed Clinical Social Worker

## 2015-03-28 DIAGNOSIS — F4322 Adjustment disorder with anxiety: Secondary | ICD-10-CM

## 2015-03-28 NOTE — BH Specialist Note (Signed)
Referring Provider: Dory PeruBROWN,KIRSTEN R, MD Session Time:  1457 - 1540 (43 minutes) Type of Service: Behavioral Health - Individual Interpreter: No.  Interpreter Name & Language: N/A Both [facility] M. Stoisits and this Scientist, research (life sciences)BH intern were present for visit.  PRESENTING CONCERNS:   Jamie BlowMaria G Lutz is a [age] y.o. female brought in by self. Jamie [facility]lowMaria G [facility]. Jamie Lutz reported reduced but still ongoing symptoms of anxiety. She reported today her pain is improving with medication.    GOALS ADDRESSED:  Stabilize anxiety level while increasing ability to function on a daily basis Enhance ability to effectively cope with the full variety of lifes anxieties    INTERVENTIONS:  Assessed current condition/needs Built rapport Provided psychoeducation Exercise to challenge and reframe automatic thoughts Reviewed results of PHQ-SADS  ASSESSMENT/OUTCOME:  Jamie Lutz presented today as friendly and engaged. She reported that she has been doing much better since [facility] visit [facility] (11/2014). She reported that her anxiety medication has just been increased and she feels it has been effective in reducing her anxiety and increasing her ability to calm herself down. She reported she is feeling significantly less pain with current medication for pelvic pain. She reported she has obtained a job and will start in two weeks, and that she is moving out of her mother's home and getting married this weekend. She was very happy when disclosing these updates.   She reported that she has been able to go to the store alone on several occasions, but feels as though she is most anxious if she has to take her sons to the store without anyone to help her with them. This BH intern reviewed relaxation techniques covered in previous sessions, and Jamie Lutz reported she has been using PMR and deep breathing with success.  This BH intern challenged Jamie Lutz to identify one  stressful situation, and identify helpful and unhelpful thoughts she might have had during that situation. At times it appeared difficult for her to rewrite her thoughts to more helpful ones, but she participated in activity and stated it was helpful.   PLAN:  Jamie Lutz will use relaxation techniques as needed until next visit. Jamie Lutz will learn to challenge automatic thoughts she has in anxiety producing scenarios.  Scheduled next visit: 04/04/2015 @ 3:15pm   Tana ConchMadeleine Morris Behavioral Health Intern, [facility]

## 2015-04-04 ENCOUNTER — Ambulatory Visit (INDEPENDENT_AMBULATORY_CARE_PROVIDER_SITE_OTHER): Payer: No Typology Code available for payment source | Admitting: Licensed Clinical Social Worker

## 2015-04-04 ENCOUNTER — Ambulatory Visit (INDEPENDENT_AMBULATORY_CARE_PROVIDER_SITE_OTHER): Payer: No Typology Code available for payment source | Admitting: Urology

## 2015-04-04 DIAGNOSIS — N941 Unspecified dyspareunia: Secondary | ICD-10-CM

## 2015-04-04 DIAGNOSIS — F4322 Adjustment disorder with anxiety: Secondary | ICD-10-CM

## 2015-04-04 DIAGNOSIS — M6289 Other specified disorders of muscle: Secondary | ICD-10-CM

## 2015-04-04 NOTE — BH Specialist Note (Signed)
Referring Provider: Dory PeruBROWN,KIRSTEN R, MD Session Time:  1610 - 9604:  1522 - 1602 (40 minutes) Type of Service: Behavioral Health - Individual Interpreter: No.  Interpreter Name & Language: N/A   PRESENTING CONCERNS:   Jamie Lutz is a 20 y.o. female brought in by self. [facility]. [name] reported reduced but still ongoing symptoms of anxiety. She has reported her pain is improving with medication.    GOALS ADDRESSED:  Stabilize anxiety level while increasing ability to function on a daily basis Enhance ability to effectively cope with the full variety of lifes anxieties    INTERVENTIONS:  Assessed current condition/needs Built rapport Provided psychoeducation Exercise to challenge and reframe automatic thoughts   ASSESSMENT/OUTCOME:  [name] presented today as friendly and engaged, and her oldest son Koleen Nimroddrian was present for visit today. 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   During today's visit, [name] participated in challenging some of her automatic thoughts about beginning her new job and replacing unhelpful thoughts with more helpful ones. This BH intern reviewed relaxation techniques with [name] and she reported she feels equipped to think positive thoughts and practice deep breathing as needed when she starts her new job.   [name] also brought up concerns about some of her son's behaviors. This BH intern provided psychoeducation and CARE handout about ignorable behaviors. Additionally, this [facility] discussed concept of special time with child, and [name] agreed to practice during the week.   PLAN:  [name] will use relaxation techniques as needed until next visit. [name] will learn to challenge automatic thoughts she has in anxiety producing  scenarios. [name] will practice special time with son 5 minutes/day on weekdays.   Scheduled next visit: [name] would like to call in about 3 weeks after she gets her work schedule for follow up visit.    Tana ConchMadeleine Morris Behavioral Health Intern, [facility]

## 2015-04-09 ENCOUNTER — Ambulatory Visit: Payer: No Typology Code available for payment source | Admitting: Pediatrics

## 2015-04-09 ENCOUNTER — Encounter: Payer: Self-pay | Admitting: Pediatrics

## 2015-04-09 NOTE — Progress Notes (Signed)
Pre-Visit Planning  Theodora BlowMaria G Tello-Banda  is a 20 y.o. female referred by Dory PeruBROWN,KIRSTEN R, MD.   [facility] on 03/09/15 for anxiety, ongoing pelvic pain.   Previous Psych Screenings? Yes  Treatment plan at last visit included continue prozac 20 mg daily.   Clinical Staff Visit Tasks:   - Urine GC/CT due? no - Psych Screenings Due? No  Provider Visit Tasks: - offer birth control if desired  - consider increase in prozac if needed [facility] Involvement? No - Pertinent Labs? No

## 2015-04-12 ENCOUNTER — Encounter (HOSPITAL_COMMUNITY): Payer: Self-pay

## 2015-04-12 ENCOUNTER — Emergency Department (HOSPITAL_COMMUNITY)
Admission: EM | Admit: 2015-04-12 | Discharge: 2015-04-12 | Disposition: A | Discharge status: Home / Self Care | Payer: No Typology Code available for payment source | Attending: Emergency Medicine | Admitting: Emergency Medicine

## 2015-04-12 DIAGNOSIS — R140 Abdominal distension (gaseous): Secondary | ICD-10-CM | POA: Insufficient documentation

## 2015-04-12 DIAGNOSIS — Z3202 Encounter for pregnancy test, result negative: Secondary | ICD-10-CM

## 2015-04-12 DIAGNOSIS — Z79899 Other long term (current) drug therapy: Secondary | ICD-10-CM | POA: Insufficient documentation

## 2015-04-12 DIAGNOSIS — Z760 Encounter for issue of repeat prescription: Secondary | ICD-10-CM | POA: Insufficient documentation

## 2015-04-12 LAB — POC URINE PREG, ED: PREG TEST UR: NEGATIVE

## 2015-04-12 MED ORDER — LEVONORGESTREL 0.75 MG PO TABS: 0.7500 mg | ORAL_TABLET | Freq: Two times a day (BID) | ORAL | Status: DC

## 2015-04-12 NOTE — ED Notes (Signed)
Pt from home wanting a pregnancy test. Pt complains of bloating no other symptoms.

## 2015-04-12 NOTE — Discharge Instructions (Signed)
Your pregnancy test was negative. Per your request I will give you a prescription for Plan b. Please call your primary care provider to schedule a follow up appointment.

## 2015-04-12 NOTE — ED Provider Notes (Signed)
CSN: [id number]     Arrival date & time 04/12/15  2029 History  By signing my name below, I, Jamie [id number] Lutz, attest that this documentation has been prepared under the direction and in the presence of Jamie Lutz Y Jamie Lutz, [place]. Electronically Signed: Doreatha MartinEva Lutz, ED Scribe. 04/12/2015. 9:24 PM.    Chief Complaint  Patient presents with  . Possible Pregnancy    The history is provided by the patient. No language interpreter was used.   HPI Comments: Jamie Lutz is a 20 y.o. female who presents to the Emergency Department requesting a pregnancy test. Pt states she has been having some abdominal bloating for a week, but no additional symptoms. LMP on 03/25/15. Pt is not on any birth control. She did not take a home pregnancy test. Per pt, she had unprotected sex 2 weeks ago as well as today. She denies nausea, emesis, abdominal pan. She is requesting a Plan B pill.   Past Medical History  Diagnosis Date  . No pertinent past medical history    Past Surgical History  Procedure Laterality Date  . No past surgeries    . Perineal laceration repair  04/17/2011    Procedure: SUTURE REPAIR PERINEAL LACERATION;  Surgeon: Tilda BurrowJohn V Ferguson, MD;  Location: [facility];  Service: Gynecology;  Laterality: N/A;   Family History  Problem Relation Age of Onset  . Diabetes Father    Social History  Substance Use Topics  . Smoking status: Never Smoker   . Smokeless tobacco: Never Used  . Alcohol Use: No   OB History    Gravida Para Term Preterm AB TAB SAB Ectopic Multiple Living   2 2 2  0 0 0 0 0 0 2     Review of Systems  Gastrointestinal: Negative for nausea, vomiting and abdominal pain.       +abdominal bloating  All other systems reviewed and are negative.  Allergies  Review of patient's allergies indicates no known allergies.  Home Medications   Prior to Admission medications   Medication Sig Start Date End Date Taking? Authorizing Provider  FLUoxetine (PROZAC) 20 MG capsule Take 1 capsule (20  mg total) by mouth daily. 03/09/15   Owens SharkMartha F Perry, MD   BP 112/81 mmHg  Pulse 76  Temp(Src) 98.2 F (36.8 C) (Oral)  Resp 18  Ht 5\' 4"  (1.626 m)  Wt 134 lb (60.782 kg)  BMI 22.99 kg/m2  SpO2 100%  LMP 03/25/2015 (Approximate) Physical Exam  Constitutional: She is oriented to person, place, and time. She appears well-developed and well-nourished.  HENT:  Head: Normocephalic and atraumatic.  Eyes: Conjunctivae and EOM are normal. Pupils are equal, round, and reactive to light.  Neck: Normal range of motion. Neck supple.  Cardiovascular: Normal rate.   Pulmonary/Chest: Effort normal. No respiratory distress.  Abdominal: She exhibits no distension.  Musculoskeletal: Normal range of motion.  Neurological: She is alert and oriented to person, place, and time.  Skin: Skin is warm and dry.  Psychiatric: She has a normal mood and affect. Her behavior is normal.  Nursing note and vitals reviewed.   ED Course  Procedures (including critical care time) DIAGNOSTIC STUDIES: Oxygen Saturation is 100% on RA, normal by my interpretation.    COORDINATION OF CARE: 9:21 PM Discussed treatment plan with pt at bedside which includes urine pregnancy and pt agreed to plan.   Labs Review Labs Reviewed  POC URINE PREG, ED    I have personally reviewed and evaluated these lab results as  part of my medical decision-making.   MDM   Final diagnoses:  Encounter for pregnancy test with result negative    Urine preg negative. Rx given for Plan B. Instructed to f/u with PCP. Encouraged safe sex practices. ER return precautions given.  I personally performed the services described in this documentation, which was scribed in my presence. The recorded information has been reviewed and is accurate.    Carlene Coria, Cordelia Poche 04/12/15 2206  Loren Racer, MD 04/12/15 2223

## 2015-04-13 ENCOUNTER — Ambulatory Visit: Payer: No Typology Code available for payment source | Admitting: Pediatrics

## 2015-04-14 ENCOUNTER — Ambulatory Visit (INDEPENDENT_AMBULATORY_CARE_PROVIDER_SITE_OTHER): Payer: No Typology Code available for payment source | Admitting: Pediatrics

## 2015-04-14 ENCOUNTER — Encounter: Payer: Self-pay | Admitting: Pediatrics

## 2015-04-14 VITALS — Temp 97.8°F | Wt 133.0 lb

## 2015-04-14 DIAGNOSIS — Z789 Other specified health status: Secondary | ICD-10-CM

## 2015-04-14 DIAGNOSIS — Z3202 Encounter for pregnancy test, result negative: Secondary | ICD-10-CM

## 2015-04-14 DIAGNOSIS — Z309 Encounter for contraceptive management, unspecified: Secondary | ICD-10-CM

## 2015-04-14 LAB — POCT URINE PREGNANCY: Preg Test, Ur: NEGATIVE

## 2015-04-14 MED ORDER — NON FORMULARY
1.5000 mg | Freq: Once | Status: AC
  Administered 2015-04-14: 1.5 mg via ORAL

## 2015-04-14 MED ORDER — ONDANSETRON 4 MG PO TBDP
8.0000 mg | ORAL_TABLET | Freq: Once | ORAL | Status: AC
  Administered 2015-04-14: 8 mg via ORAL

## 2015-04-14 NOTE — Patient Instructions (Addendum)
Please call to reschedule your appointment with the adolescent clinic to get the Nexplanon or IUD.   Levonorgestrel emergency contraceptive kit  What is this medicine? LEVONORGESTREL (LEE voe nor jes trel) is an emergency contraceptive (birth control pill). It prevents pregnancy if taken within the 72 hours after unprotected sex. This medicine will not work if you are already pregnant. This medicine may be used for other purposes; ask your health care provider or pharmacist if you have questions.  What should I tell my health care provider before I take this medicine? They need to know if you have or ever had any of these conditions: -blood sugar problems, like diabetes -cancer of the breast, cervix, ovary, uterus, vagina, or unusual vaginal bleeding -fibroids -liver disease -menstrual problems -migraine headaches -an unusual or allergic reaction to levonorgestrel, other medicines, foods, dyes, or preservatives -pregnant or trying to get pregnant -breast-feeding  How should I use this medicine? Take this medicine by mouth. Your doctor may want you to use a quick-response pregnancy test prior to using the tablets. Take your medicine as soon as you can after having unprotected sex, preferably in the first 24 hours, but no later than 72 hours (3 days) after the event. Follow the dose instructions of your health care provider exactly. Do not take any extra pills. Extra pills will not decrease your risk of pregnancy, but may increase your risk of side effects. A patient package insert for the product will be given with each prescription and refill. Read this sheet carefully each time. The sheet may change frequently. Contact your pediatrician regarding the use of this medicine in children. Special care may be needed. This medicine has been used in female children who have started having menstrual periods. Overdosage: If you think you have taken too much of this medicine contact a poison control  center or emergency room at once. NOTE: This medicine is only for you. Do not share this medicine with others.  What if I miss a dose? If you miss a dose or vomit within 1 hour of taking your dose, you MUST contact your health care professional for instructions. What may interact with this medicine? Do not take this medicine with any of the following medications: -amprenavir -bosentan -fosamprenavir This medicine may also interact with the following medications: -aprepitant -barbiturate medicines for inducing sleep or treating seizures -bexarotene -griseofulvin -medicines to treat seizures like carbamazepine, ethotoin, felbamate, oxcarbazepine, phenytoin, topiramate -modafinil -pioglitazone -rifabutin -rifampin -rifapentine -some medicines to treat HIV infection like atazanavir, indinavir, lopinavir, nelfinavir, tipranavir, ritonavir -St. John's wort -warfarin This list may not describe all possible interactions. Give your health care provider a list of all the medicines, herbs, non-prescription drugs, or dietary supplements you use. Also tell them if you smoke, drink alcohol, or use illegal drugs. Some items may interact with your medicine. What should I watch for while using this medicine? Emergency birth control is not to be used routinely to prevent pregnancy. Discuss birth control options with your health care provider. Make a follow-up appointment to see your health care provider in 3 to 4 weeks after using this medicine. It is common to have spotting after using this medicine. If you miss your next period, the possibility of pregnancy must be considered. See your health care professional as soon as you can and get a pregnancy test. Smoking increases the risk of getting a blood clot or having a stroke while you are taking birth control pills, especially if you are more than 22 years  old. Dennis Bast are strongly advised not to smoke. This medicine does not protect you against HIV infection  (AIDS) or any other sexually transmitted diseases. What side effects may I notice from receiving this medicine? Side effects that you should report to your doctor or health care professional as soon as possible: -Severe side effects are not common. However, the potential for severe side effects may exist and you may want to discuss these with your health care provider. Side effects that usually do not require medical attention (report to your doctor or health care professional if they continue or are bothersome): -abdominal pain or cramping -breast tenderness -dizziness -nausea -spotting This list may not describe all possible side effects. Call your doctor for medical advice about side effects. You may report side effects to FDA at 1-800-FDA-1088. Where should I keep my medicine? Keep out of the reach of children. Store at room temperature between 15 and 30 degrees C (59 and 86 degrees F). Throw away any unused medicine after the expiration date. NOTE: This sheet is a summary. It may not cover all possible information. If you have questions about this medicine, talk to your doctor, pharmacist, or health care provider.    2016, Elsevier/Gold Standard. (2007-12-23 13:51:24)

## 2015-04-14 NOTE — Progress Notes (Signed)
    Subjective:    Jamie Lutz is a 20 y.o. female accompanied by self presenting to the clinic today to gte Plan B pills & get a urine pregnancy test. Patient had unprotected sex 2 days prior to clinic visit & had been to the ED to gte emergency contraception. As she does not have insurance, she could not afford Plan B & decided to come here for the medication.  Pt prev had Nexplanon but removed after about one year due to intermittent bleeding.  Does not feel that OCPs would be a good fit for her and does not want IUD. She is against contraception but also does not want to get pregnant. She seems very conflicted. She also has a h/o anxiety & admits that getting pregnant would not be a good idea for her.   Review of Systems  Constitutional: Negative for activity change.  Gastrointestinal: Negative for abdominal pain.  Genitourinary: Negative for dysuria and dyspareunia.       Objective:   Physical Exam  Constitutional: She appears well-developed.  HENT:  Mouth/Throat: Oropharynx is clear and moist.  Eyes: Conjunctivae are normal.  Neck: Normal range of motion.  Cardiovascular: Normal rate and normal heart sounds.   Pulmonary/Chest: Breath sounds normal.  Abdominal: Soft. There is no tenderness.  Skin: No rash noted.   .Temp(Src) 97.8 F (36.6 C)  Wt 133 lb (60.328 kg)  LMP 03/25/2015 (Approximate)        Assessment & Plan:  Emergency contraception  Plan B - 1 step, 1 pill 1.5 mg given to patient in clinic with Zofran 8 mg to avoid nausea as patient was nervous about emesis. Need for pregnancy test in 2 weeks discussed- can do a home test.  Detailed discussion regarding use of condoms & options for contraception. Pt declined an apt for follow up with Dr Manson PasseyBrown or a referral to adolescent medicine for IUD. She would like to think about it & make a decision.  Return if symptoms worsen or fail to improve.  Tobey BrideShruti Siri Buege, MD 04/20/2015 11:32 AM

## 2015-05-08 ENCOUNTER — Ambulatory Visit (INDEPENDENT_AMBULATORY_CARE_PROVIDER_SITE_OTHER): Payer: No Typology Code available for payment source | Admitting: Pediatrics

## 2015-05-08 ENCOUNTER — Encounter: Payer: Self-pay | Admitting: Pediatrics

## 2015-05-08 VITALS — BP 96/64 | Temp 98.2°F | Wt 132.6 lb

## 2015-05-08 DIAGNOSIS — IMO0001 Reserved for inherently not codable concepts without codable children: Secondary | ICD-10-CM

## 2015-05-08 DIAGNOSIS — F329 Major depressive disorder, single episode, unspecified: Secondary | ICD-10-CM

## 2015-05-08 DIAGNOSIS — G4452 New daily persistent headache (NDPH): Secondary | ICD-10-CM

## 2015-05-08 DIAGNOSIS — Z3202 Encounter for pregnancy test, result negative: Secondary | ICD-10-CM

## 2015-05-08 DIAGNOSIS — Z8742 Personal history of other diseases of the female genital tract: Secondary | ICD-10-CM

## 2015-05-08 DIAGNOSIS — F32A Depression, unspecified: Secondary | ICD-10-CM

## 2015-05-08 LAB — POCT URINE PREGNANCY: PREG TEST UR: NEGATIVE

## 2015-05-08 LAB — TSH: TSH: 0.4 mIU/L

## 2015-05-08 NOTE — Progress Notes (Signed)
History was provided by the patient.  Jamie Lutz is a 20 y.o. female who is here for several complaints including headache, sensation of milk let down, and worsening depressive symptoms.     HPI:   This is a 20 year old female with history of constipation, myofascial abdominal wall pain, anxiety, coming in with headache, sensation of "milk let down", and feeling down lately  Headache occurs on top and back of head; like a pressure, makes her irritable; going on for several weeks,  Tried motrin once - took 800 mg once  (but doesn't like to take medicaiton much), helped a little bit for a little whlie occuring daily; tend to occur all day, rarely goes away, occasionally wakes her from sleep, getting worse overall No photophobia, phonophobia but feels that she can't hear well since headaches started Mild nausea No history of migraines Had been having headaches before but not as much before Sleeps about 8 hours a night Caffeine - none No numbness, tingling or weakness Sometimes feels dizzy like room is spinning, twice a week, can occur at any time including when lying down at end of day Unsure of any vision changes - wears glasses that she gets at [employer]  Has had sore throat, on and off, awhile; no runny nose or congestion; No coughing. No fevers. No sick contacts  Also concerned about sensation of milk let down, "like when I used to breast feed" - has had prior pregnancy; seen in ER 04/14/15 for emergency contraception after unprotected intercourse 2 days prior; Urine pregnancy test was negative at that time LMP: 04/25/15 Sexually active, using condoms every time Also endorses nausea and belly pain and constipation  In past, has stated that she does not want to get pregnant but does not want to be on birth control. Previously had implanon, but removed >1 year ago for irregular bleeding. Previously has declined referral for IUD. Does not think pills will be a good fit  As far as  depression: is just feeling really down recently, no SI/HI, was feeling fine before but now for about 2-3 weeks, just doesn't want to do anything;  For her anxiety/depression she is on prozac 20 mg, no recent changes; has been on it for <1 year;  PHQ-9 of 12 today (over past year ranging from 8-13, one documented at 16 back in April 2016) Followed by adolescent and behavioral health clinics Is also on muscle relaxant - diazepam 5 mg as needed, not using much recently  Lives with 2 kids, and husband The following portions of the patient's history were reviewed and updated as appropriate: allergies, current medications, past family history, past medical history, past social history, past surgical history and problem list.  Physical Exam:  BP 96/64 mmHg  Temp(Src) 98.2 F (36.8 C) (Temporal)  Wt 60.147 kg (132 lb 9.6 oz)  LMP 04/25/2015 (Exact Date)  Facility age limit for growth percentiles is 20 years. Patient's last menstrual period was 04/25/2015 (exact date).    General:   alert and cooperative     Skin:   normal  Oral cavity:   lips, mucosa, and tongue normal; teeth and gums normal  Eyes:   sclerae white, pupils equal and reactive, red reflex normal bilaterally  Ears:   normal bilaterally  Nose: clear, no discharge  Neck:  Neck supple, no masses, no LAD  Lungs:  clear to auscultation bilaterally  Heart:   regular rate and rhythm, S1, S2 normal, no murmur, click, rub or gallop  Abdomen:  soft, mild generalized tenderness, no guarding/rebound, no palpable masses  GU:  not examined  Extremities:   extremities normal, atraumatic, no cyanosis or edema  Neuro:  mental status, speech normal, alert and oriented x3, PERLA, II: visual field normal, occasional difficulty with central vision but not reliably reproducible; , II: pupils equal, round, reactive to light and accommodation, III,VII: ptosis absent, no proptosis;, III,IV,VI: extraocular muscles EOMI, with end gaze nystagmus  laterally to the left, and complaint of dizziness with left gaze;, V: mastication normal, V: facial light touch sensation normal bilaterally, VIII: hearing normal and grossly, IX: soft palate elevation normal bilaterally, XI: trapezius strength normal bilaterally, XI: sternocleidomastoid strength normal bilaterally, muscle tone and strength normal and symmetric, reflexes normal and symmetric and gait and station normal    Assessment/Plan:  This is a 20 year old female with history of constipation, myofascial abdominal wall pain, anxiety, coming in with headache, sensation of "milk let down", and feeling down lately; Unclear headache type and etiology at this time, overall reassuing neuro exam but will check some labs, check vision, and have patient return for repeat eval. Pregnancy negative today. Depression worse recently, no SI/HI, PHQ-9 of 12 today - will get patient in to see therapist sooner.  Headache - unclear type and etiology at this time. Daily, band like, could be tension but last all day. No history of migraines, some associated nausea but duration does not fit. Has only tried motrin once, doubt rebound. No associated allergies/infectious symptoms. Some reports of dizziness, some reports of hearing change, unclear if vision has changed but has never been fully evaluated. Unclear if related to worsening depression or sensation of milk let down. Gross hearing on exam is normal, visual field testing not reproducible, no other focal findings elicited on exam. - TSH, prolactin - ophtho referral for vision evaluation - keep headache journal -  Ok to use motrin prn since helped - follow up with PCP in 4 weeks to recheck symptoms - plenty of water, plenty of rest, minimize screen time - return precautions/warning signs discussed   Sensation of breast engorgement/milk let down - denies galactorrhea.  - pregrnancy negative - prolactin as above - readdress contraception with PCP in  future  Depression - PHQ-9 of 12 today; denies SI/HI - continue prozac 20 mg - TSH as above - follow up with therapist  - Immunizations today: none  - Follow-up visit in 4 weeks for recheck of headaches with PCP (06/15/15) follow up with therapist on 4/25, return to clinic sooner as needed.    Varney DailyKatherine Santino Kinsella, MD  05/08/2015

## 2015-05-08 NOTE — Patient Instructions (Addendum)
It was nice to meet you today!  For your headache, we will check some basic labs including thyroid function today  Keep a headache journal to monitor your symptoms and any associated changes day to day  You can use motrin 800 mg as needed for headache, or can try tylenol  Drink plenty of water  Go to your eye doctor to get your vision rechecked - as changes in your vision can frequently cause headaches  Try to let your eyes rest at night - do not look at the computer or other screens for a few hours before bed  Get plenty of sleep  Make an appointment with your therapist to talk about your depression and they may consider if any medication adjustments are necessary for you  Follow up with your primary care doctor in 4 weeks to recheck your headache symptoms  General Headache Without Cause A headache is pain or discomfort felt around the head or neck area. The specific cause of a headache may not be found. There are many causes and types of headaches. A few common ones are:  Tension headaches.  Migraine headaches.  Cluster headaches.  Chronic daily headaches. HOME CARE INSTRUCTIONS  Watch your condition for any changes. Take these steps to help with your condition: Managing Pain  Take over-the-counter and prescription medicines only as told by your health care provider.  Lie down in a dark, quiet room when you have a headache.  If directed, apply ice to the head and neck area:  Put ice in a plastic bag.  Place a towel between your skin and the bag.  Leave the ice on for 20 minutes, 2-3 times per day.  Use a heating pad or hot shower to apply heat to the head and neck area as told by your health care provider.  Keep lights dim if bright lights bother you or make your headaches worse. Eating and Drinking  Eat meals on a regular schedule.  Limit alcohol use.  Decrease the amount of caffeine you drink, or stop drinking caffeine. General Instructions  Keep all  follow-up visits as told by your health care provider. This is important.  Keep a headache journal to help find out what may trigger your headaches. For example, write down:  What you eat and drink.  How much sleep you get.  Any change to your diet or medicines.  Try massage or other relaxation techniques.  Limit stress.  Sit up straight, and do not tense your muscles.  Do not use tobacco products, including cigarettes, chewing tobacco, or e-cigarettes. If you need help quitting, ask your health care provider.  Exercise regularly as told by your health care provider.  Sleep on a regular schedule. Get 7-9 hours of sleep, or the amount recommended by your health care provider. SEEK MEDICAL CARE IF:   Your symptoms are not helped by medicine.  You have a headache that is different from the usual headache.  You have nausea or you vomit.  You have a fever. SEEK IMMEDIATE MEDICAL CARE IF:   Your headache becomes severe.  You have repeated vomiting.  You have a stiff neck.  You have a loss of vision.  You have problems with speech.  You have pain in the eye or ear.  You have muscular weakness or loss of muscle control.  You lose your balance or have trouble walking.  You feel faint or pass out.  You have confusion.   This information is not intended to  replace advice given to you by your health care provider. Make sure you discuss any questions you have with your health care provider.   Document Released: 01/06/2005 Document Revised: 09/27/2014 Document Reviewed: 05/01/2014 Elsevier Interactive Patient Education [employer].

## 2015-05-09 LAB — PROLACTIN: PROLACTIN: 6.6 ng/mL

## 2015-05-13 ENCOUNTER — Encounter: Payer: Self-pay | Admitting: Family

## 2015-05-13 NOTE — Progress Notes (Signed)
Patient ID: Jamie Lutz, female   DOB: 02/04/1995, 20 y.o.   MRN: [id number] Pre-Visit Planning  Jamie Lutz  is a 20 y.o. female referred by Dory PeruBROWN,KIRSTEN R, MD.   Last seen in [facility] on 03/09/15 for anxiety.   Date and Type of Previous Psych Screenings? Yes PHQ-SADS 03/28/2015  PHQ-15 5  GAD-7 7  PHQ-9 8  Suicidal Ideation No  Comment No panic attacks reported and somwhat difficult to complete ADL   PHQ-SADS 03/09/2015  [id number] 7  GAD-7 8  PHQ-9 11  Suicidal Ideation No  Comment Yes to anxiety attack 1x/week, "Somewhat difficult" to complete ADL   PHQ-SADS 01/27/2015  PHQ-15 7  GAD-7 11  PHQ-9 10  Suicidal Ideation No  Comment yes to anxiety attacks and very difficult to complete ADLs          Clinical Staff Visit Tasks:   - Urine GC/CT due? no - HIV Screening due?  no - Psych Screenings Due? Yes, phqsads -   Provider Visit Tasks: - assess symptoms, med compliance  - [facility] Involvement? Maybe - Pertinent Labs? No  >3 minutes spent reviewing records and planning for patient's visit.

## 2015-05-15 ENCOUNTER — Ambulatory Visit (INDEPENDENT_AMBULATORY_CARE_PROVIDER_SITE_OTHER): Payer: No Typology Code available for payment source | Admitting: Licensed Clinical Social Worker

## 2015-05-15 ENCOUNTER — Ambulatory Visit: Payer: Self-pay | Admitting: Family

## 2015-05-15 ENCOUNTER — Ambulatory Visit (INDEPENDENT_AMBULATORY_CARE_PROVIDER_SITE_OTHER): Payer: Self-pay | Admitting: Family

## 2015-05-15 ENCOUNTER — Encounter: Payer: Self-pay | Admitting: Family

## 2015-05-15 VITALS — BP 109/74 | HR 73 | Ht 63.78 in | Wt 132.0 lb

## 2015-05-15 DIAGNOSIS — F4322 Adjustment disorder with anxiety: Secondary | ICD-10-CM

## 2015-05-15 NOTE — Progress Notes (Signed)
THIS RECORD MAY CONTAIN CONFIDENTIAL INFORMATION THAT SHOULD NOT BE RELEASED WITHOUT REVIEW OF THE SERVICE PROVIDER.  Adolescent Medicine Consultation Follow-Up Visit Jamie Lutz  is a 20 y.o. female referred by Jonetta OsgoodBrown, Kirsten, MD here today for follow-up.    Previsit planning completed:  Yes Patient ID: Jamie Lutz, female   DOB: 09/16/1995, 20 y.o.   MRN: [id number] Pre-Visit Planning  Jamie Lutz  is a 20 y.o. female referred by Dory PeruBROWN,KIRSTEN R, MD.   Last seen in Adolescent Medicine Clinic on 03/09/15 for anxiety.   Date and Type of Previous Psych Screenings? Yes PHQ-SADS 03/28/2015  PHQ-15 5  GAD-7 7  PHQ-9 8  Suicidal Ideation No  Comment No panic attacks reported and somwhat difficult to complete ADL   PHQ-SADS 03/09/2015  [id number] 7  GAD-7 8  PHQ-9 11  Suicidal Ideation No  Comment Yes to anxiety attack 1x/week, "Somewhat difficult" to complete ADL   PHQ-SADS 01/27/2015  PHQ-15 7  GAD-7 11  PHQ-9 10  Suicidal Ideation No  Comment yes to anxiety attacks and very difficult to complete ADLs          Clinical Staff Visit Tasks:   - Urine GC/CT due? no - HIV Screening due?  no - Psych Screenings Due? Yes, phqsads -   Provider Visit Tasks: - assess symptoms, med compliance  - BHC Involvement? Maybe - Pertinent Labs? No  >3 minutes spent reviewing records and planning for patient's visit.   Growth Chart Viewed? no   History was provided by the patient.  PCP Confirmed?  Yes, Jonetta OsgoodKirsten Brown   My Chart Activated?   yes   HPI:    For about a month having feelings of being really down. Denies SI/HI.  No new meds or med changes; anxiety attacks have decreased.  Taking Valium when she has muscle spasms - urology following. Rare use.  Reports she is unsure about increasing medication, although admits her family remarks they see an improvement since she started taking Prozac.  Things are OK and she is safe  at home; work stress is the same.  She denies any physical pain or discomfort today.   Review of Systems  Constitutional: Negative.   HENT: Negative.   Eyes: Negative.   Respiratory: Negative.   Cardiovascular: Negative.   Gastrointestinal: Negative.   Genitourinary: Negative.   Musculoskeletal: Negative.   Skin: Negative.   Neurological: Negative.   Endo/Heme/Allergies: Negative.   Psychiatric/Behavioral: Negative.     Patient's last menstrual period was 04/25/2015 (exact date). No Known Allergies Outpatient Prescriptions Prior to Visit  Medication Sig Dispense Refill  . FLUoxetine (PROZAC) 20 MG capsule Take 1 capsule (20 mg total) by mouth daily. 30 capsule 1   No facility-administered medications prior to visit.     Patient Active Problem List   Diagnosis Date Noted  . Urinary frequency 12/08/2014  . Adjustment disorder with anxious mood 11/03/2014  . 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 Chronic abdominal pain 05/03/2014  . Constipation 05/03/2014  . Paresthesia 02/06/2014  . Teenage mother 02/03/2014    Confidentiality was discussed with the patient and if applicable, with caregiver as well.  Patient's personal or confidential phone number: [phone]   The following portions of the patient's history were reviewed and updated as appropriate: allergies, current medications, past family history, past medical history, past social history, past surgical history and problem list.  Physical Exam:  Filed Vitals:   05/15/15 1503  BP: 109/74  Pulse: 73  Height: 5' 3.78" (1.62 m)  Weight: 132 lb (59.875 kg)   BP 109/74 mmHg  Pulse 73  Ht 5' 3.78" (1.62 m)  Wt 132 lb (59.875 kg)  BMI 22.81 kg/m2  LMP 04/25/2015 (Exact Date) Body mass index: body mass index is 22.81 kg/(m^2). Facility age limit for growth percentiles is 20 years.  Physical Exam  Constitutional: No distress.  Neck: No thyromegaly present.  Cardiovascular: Normal rate and regular  rhythm.   No murmur heard. Pulmonary/Chest: Breath sounds normal.  Abdominal: Soft. There is no tenderness. There is no guarding.  Musculoskeletal: She exhibits no edema.  Lymphadenopathy:    She has no cervical adenopathy.  Neurological: She is alert.  Nursing note and vitals reviewed.    Assessment/Plan: 1. Adjustment disorder with anxious mood -discussed increasing prozac from 20 mg to 30 mg  -continue with BH visits - she will see Maddy today  -she will call in if she decides she would like increase in medication; reviewed dosage and recommendation to increase; BBW reviewed.  -reviewed endorphins, exercise, natural sunlight, positive thoughts to increase serotonin   Follow-up:  Return in about 6 weeks (around 06/26/2015) for medication follow-up.   Medical decision-making:  > 25 minutes spent, more than 50% of appointment was spent discussing diagnosis and management of symptoms

## 2015-05-15 NOTE — Patient Instructions (Signed)
Call in if you decide you'd like for me to increase the medication dose.

## 2015-05-15 NOTE — BH Specialist Note (Signed)
Referring Provider: Dory PeruBROWN,KIRSTEN R, MD Session Time:  1536 - 1556 (20 minutes) Type of Service: Behavioral Health - Individual Interpreter: No.  Interpreter Name & Language: N/A   PRESENTING CONCERNS:   Jamie Lutz is a 20 y.o. female brought in by self. [facility]. Jamie Lutz reported reduced but still ongoing symptoms of anxiety. She has reported her pain is improving with medication.    GOALS ADDRESSED:  Enhance ability to effectively cope with the full variety of lifes anxieties    INTERVENTIONS:  Assessed current condition/needs Built rapport Provided psychoeducation Review of coping skills   ASSESSMENT/OUTCOME:  Jamie Lutz reported that she has been feeling depressed over the last few weeks, and has felt really lonely. She has been adjusting to several big transitions, and is struggling to feel as though she has someone to talk to about everything. She reported she tells her husband and mom when she feels sad, but they both tell her to "get over it." She doesn't feel understood by them.   She reported that she has been running again and this [facility] provided psychoeducation around benefits of exercise on mood. This BH intern reviewed several coping skills, and Jamie Lutz chose to try spending time outside when she is feeling sad.   She had questions about how increased dose of Prozac might impact mood, and this BH intern provided psychoeducation around biological and situational/environmental components of mood disorders.   PLAN:  Jamie Lutz will spend time outside and continue to run with friends when feeling sad and lonely. Jamie Lutz will use relaxation techniques as needed. [place] challenge automatic thoughts she has in anxiety producing scenarios.    [facility] in future if she would like resources for ongoing counseling services.     Tana ConchMadeleine Morris Behavioral Health Intern, [facility]

## 2015-05-31 ENCOUNTER — Other Ambulatory Visit: Payer: Self-pay | Admitting: Pediatrics

## 2015-06-05 ENCOUNTER — Ambulatory Visit (HOSPITAL_COMMUNITY): Payer: No Typology Code available for payment source | Admitting: Physical Therapy

## 2015-06-08 ENCOUNTER — Ambulatory Visit (HOSPITAL_COMMUNITY): Payer: No Typology Code available for payment source | Admitting: Physical Therapy

## 2015-06-09 ENCOUNTER — Ambulatory Visit (INDEPENDENT_AMBULATORY_CARE_PROVIDER_SITE_OTHER): Payer: No Typology Code available for payment source | Admitting: Pediatrics

## 2015-06-09 ENCOUNTER — Encounter: Payer: Self-pay | Admitting: Pediatrics

## 2015-06-09 VITALS — BP 90/62 | Temp 98.2°F | Ht 64.0 in | Wt 132.6 lb

## 2015-06-09 DIAGNOSIS — R519 Headache, unspecified: Secondary | ICD-10-CM

## 2015-06-09 DIAGNOSIS — R51 Headache: Secondary | ICD-10-CM

## 2015-06-09 DIAGNOSIS — R300 Dysuria: Secondary | ICD-10-CM

## 2015-06-09 DIAGNOSIS — F4322 Adjustment disorder with anxiety: Secondary | ICD-10-CM

## 2015-06-09 DIAGNOSIS — R42 Dizziness and giddiness: Secondary | ICD-10-CM

## 2015-06-09 LAB — POCT URINALYSIS DIPSTICK
BILIRUBIN UA: NEGATIVE
Blood, UA: NEGATIVE
Glucose, UA: NORMAL
KETONES UA: NEGATIVE
Leukocytes, UA: NEGATIVE
Nitrite, UA: NEGATIVE
PH UA: 5
Spec Grav, UA: 1.02
Urobilinogen, UA: NEGATIVE

## 2015-06-09 LAB — POCT URINE PREGNANCY: Preg Test, Ur: NEGATIVE

## 2015-06-09 LAB — POCT GLUCOSE (DEVICE FOR HOME USE): Glucose Fasting, POC: 115 mg/dL — AB (ref 70–99)

## 2015-06-09 LAB — POCT HEMOGLOBIN: Hemoglobin: 14.3 g/dL (ref 12.2–16.2)

## 2015-06-09 MED ORDER — FLUOXETINE HCL 10 MG PO CAPS: 10.0000 mg | ORAL_CAPSULE | Freq: Every day | ORAL | Status: DC

## 2015-06-09 MED ORDER — NAPROXEN 375 MG PO TABS: 375.0000 mg | ORAL_TABLET | Freq: Two times a day (BID) | ORAL | Status: DC

## 2015-06-09 NOTE — Patient Instructions (Signed)
Jamie HesselbachMaria all your labs today are normal. Your headaches maybe related to anxiety. Please maintain a Headache diary & see what triggers the headache. Please increase the dose of Prozac to 30 mg daily. You can also take Naproxen 375 mg twice daily for the next 3 days for the pain.  Please return next week to see Dr Manson PasseyBrown.

## 2015-06-09 NOTE — Progress Notes (Signed)
Subjective:   Patient seen in Saturday clinic.  Jamie Lutz is a 20 y.o. female accompanied by self presenting to the clinic today with a chief c/o of headaches for the past 1 week. She has been seen in clinic for the same reason last month too & found to have increased symptoms of anxiety & depression. She had a follow up on 4/25 in the adolescent clinic & was advised to increase her dose of Prozac from 20 mg to 30 mg but she did not do that as no script was given. She reports to have low energy but denie sbeing sad. No SI. Sleeps 8 hrs at night but is having almost daily headaches- mostly frontal. She has taken ibuprofen 800 mg only twice in the past week. She says she does not like to take pain meds. Referral to Opthal had been made but she has not been able to return Ines's phone call to make that appt. The headaches are usually throbbing- no associated phono or photophobia. No nausea or emesis but she is unable to do work if headache is severe. She gets relief from motrin. 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 No family h/o migraines.  Past history sig for anxiety but denies frequent headaches in the past.  LMP- 05/25/15. Not on any birth control.   Review of Systems  Constitutional: Negative for fever and activity change.  HENT: Negative for congestion.   Neurological: Positive for weakness and headaches.  Psychiatric/Behavioral: Negative for sleep disturbance and self-injury. The patient is nervous/anxious.        Objective:   Physical Exam  Constitutional: She is oriented to person, place, and time. She appears well-developed and well-nourished.  HENT:  Head: Normocephalic.  Right Ear: External ear normal.  Left Ear: External ear normal.  Mouth/Throat: Oropharynx is clear and moist.  Eyes: Conjunctivae are normal.  Neck: Normal range of motion. Neck supple.  Cardiovascular: Normal rate, regular rhythm and normal heart  sounds.   Pulmonary/Chest: Breath sounds normal.  Abdominal: Soft.  Neurological: She is alert and oriented to person, place, and time. No cranial nerve deficit. Coordination normal.  Skin: No rash noted.   .BP 90/62 mmHg  Temp(Src) 98.2 F (36.8 C) (Temporal)  Ht 5\' 4"  (1.626 m)  Wt 132 lb 9.6 oz (60.147 kg)  BMI 22.75 kg/m2  LMP 05/25/2015        Assessment & Plan:  1. Dizziness R/o UTI & pregnancy & anemai  - POCT urine pregnancy - POCT hemoglobin - POCT Glucose (Device for Home Use) - POCT urinalysis dipstick  All labs are normal  Results for orders placed or performed in visit on 06/09/15 (from the past 24 hour(s))  POCT hemoglobin     Status: Normal   Collection Time: 06/09/15  8:56 AM  Result Value Ref Range   Hemoglobin 14.3 12.2 - 16.2 g/dL  POCT Glucose (Device for Home Use)     Status: Abnormal   Collection Time: 06/09/15  8:56 AM  Result Value Ref Range   Glucose Fasting, POC 115 (A) 70 - 99 mg/dL   POC Glucose  70 - 99 mg/dl  POCT urine pregnancy     Status: Normal   Collection Time: 06/09/15  9:02 AM  Result Value Ref Range   Preg Test, Ur Negative Negative  POCT urinalysis dipstick     Status: Normal   Collection Time: 06/09/15  9:08 AM  Result Value Ref  Range   Color, UA yellow    Clarity, UA clear    Glucose, UA normal    Bilirubin, UA negative    Ketones, UA negative    Spec Grav, UA 1.020    Blood, UA negative    pH, UA 5.0    Protein, UA trace    Urobilinogen, UA negative    Nitrite, UA negative    Leukocytes, UA Negative Negative    2. Headache, unspecified headache type Anxiety HA pattern does not fit migraine. But new onset & in clusters- likely tension or cluster headaches. Discussed increaesing the dose of Prozac as patient continues to have low energy & did not increase the dose as discussed during her adolescent viist on 05/15/15. Will sent script for 10 mg Prozac so she can take 30 mg for the next 2 weeks & return for follow  up with adolescent pod. Hydration discussed. Avoid caffiene, maintain headache diary  Naproxen 375 mg bid prn.  RTC to follow up with PCP for headacjes. Also needs Opthal appt set up at that visit. Consider Neuro if headaches are intractable.   Return in about 5 days (around 06/14/2015) for Recheck with PCP.  Tobey Bride, MD 06/09/2015 1:59 PM

## 2015-06-15 ENCOUNTER — Ambulatory Visit (INDEPENDENT_AMBULATORY_CARE_PROVIDER_SITE_OTHER): Payer: No Typology Code available for payment source | Admitting: Pediatrics

## 2015-06-15 ENCOUNTER — Encounter: Payer: Self-pay | Admitting: Pediatrics

## 2015-06-15 ENCOUNTER — Ambulatory Visit: Payer: Self-pay | Admitting: Pediatrics

## 2015-06-15 VITALS — BP 90/64 | Wt 132.4 lb

## 2015-06-15 DIAGNOSIS — G44219 Episodic tension-type headache, not intractable: Secondary | ICD-10-CM

## 2015-06-15 DIAGNOSIS — F4322 Adjustment disorder with anxiety: Secondary | ICD-10-CM

## 2015-06-15 DIAGNOSIS — Z3200 Encounter for pregnancy test, result unknown: Secondary | ICD-10-CM

## 2015-06-15 NOTE — Patient Instructions (Signed)
Start the 30 mg Prozac daily.   Restart the herbal supplements you were using since they seemed to help your headaches.  Try some breathing and relaxation exercises as well.

## 2015-06-15 NOTE — Progress Notes (Signed)
  Subjective:    Jamie Lutz is a 20 y.o. old female here by herself for headache follow up.    HPI  Headaches - happening almost daily, hurt in back of the head and sometimes in forehead.  Sometimes has trouble sleeping due to headache.  Occasionally happen first thing in the morning but only if she had the same headache the  Night before.  Ibuprofen somewhat helpful - takes 800 mg, only takes on average once weekly.   To bed at 10 pm, up at 7 am or 8 am.  Drinks a lot of water. No caffeine intake, no alcohol intake.   Only meds are prozac 20 mg once daily.  Was given rx for diazepam as a muscle relaxer by a urologist. Leanora Ivanoffakes for abdominal pain - takes only approx once every 3 weeks.   Has stopped all teas and herbal supplements. Headaches were better when she took them. Can't really say why she stopped them, just stopped them.   Review of Systems  Constitutional: Negative for activity change, appetite change and unexpected weight change.  Neurological: Negative for dizziness and light-headedness.    Immunizations needed: none     Objective:    BP 90/64 mmHg  Wt 132 lb 6.4 oz (60.056 kg)  LMP 05/25/2015 Physical Exam  Constitutional: She appears well-developed and well-nourished.  HENT:  Head: Normocephalic.  Eyes: Conjunctivae are normal.  Cardiovascular: Normal rate.   Pulmonary/Chest: Effort normal and breath sounds normal.  Abdominal: Soft.  Neurological: She is alert.       Assessment and Plan:     Jamie Lutz was seen today for .   Problem List Items Addressed This Visit    Adjustment disorder with anxious mood   Cephalalgia - Primary    Other Visit Diagnoses    Encounter for pregnancy test        Relevant Orders    POCT urine pregnancy      Headache - seem to be tension-type in nature. Offered neurology appt but Jamie Lutz declined. Reviewed importance of adequate sleep and water intake. Consider starting herbal supplements back since they seemed to help the headaches.  Jamie Lutz also believes the headaches are related to her anxiety symptoms so strongly encouraged her to increase the prozac from 20 to 30 mg as previously recommended.   Adjustment disorder with anxious mood - increase prozac as recommended. Has follow up 06/26/15.   Discussed need to transition to adult care soon. Has new patient appointment scheduled for this September.   Total face to face time 25 minutes , majority spent counseling.    Return if symptoms worsen or fail to improve.  Dory PeruBROWN,Roneshia Drew R, MD

## 2015-06-20 ENCOUNTER — Ambulatory Visit: Payer: No Typology Code available for payment source | Admitting: Urology

## 2015-06-25 ENCOUNTER — Ambulatory Visit (HOSPITAL_COMMUNITY): Payer: Self-pay | Attending: Urology | Admitting: Physical Therapy

## 2015-06-25 DIAGNOSIS — M25551 Pain in right hip: Secondary | ICD-10-CM | POA: Insufficient documentation

## 2015-06-25 DIAGNOSIS — M25552 Pain in left hip: Secondary | ICD-10-CM | POA: Insufficient documentation

## 2015-06-25 DIAGNOSIS — R3989 Other symptoms and signs involving the genitourinary system: Secondary | ICD-10-CM | POA: Insufficient documentation

## 2015-06-25 NOTE — Therapy (Signed)
[facility] [address] [place], [place], [address] Phone: [phone]   Fax:  [phone]  Physical Therapy Evaluation  Patient Details  Name: Jamie BlowMaria G Lutz MRN: [id number] Date of Birth: 09/05/1995 Referring Provider: Dr. Sherre ScarletPatrik L McKenzie   Encounter Date: 06/25/2015      PT End of Session - 06/25/15 1255    Visit Number 1   Number of Visits 4   Date for PT Re-Evaluation 07/25/15   Authorization Type no insurance   PT Start Time [id number]   PT Stop Time 1030   PT Time Calculation (min) 35 min   Activity Tolerance Patient tolerated treatment well   Behavior During Therapy [facility] for tasks assessed/performed      Past Medical History  Diagnosis Date  . No pertinent past medical history     Past Surgical History  Procedure Laterality Date  . No past surgeries    . Perineal laceration repair  04/17/2011    Procedure: SUTURE REPAIR PERINEAL LACERATION;  Surgeon: Tilda BurrowJohn V Ferguson, MD;  Location: [facility];  Service: Gynecology;  Laterality: N/A;    There were no vitals filed for this visit.       Subjective Assessment - 06/25/15 1000    Subjective [name] states that she began having muscle spasms in her pelvic area approximately four years ago after the birth of her first child.  She states that the birth of her second child increased her pain to a greater degree.  She [facility] that [facility] internal and external exam with exercises which improved her pain to a certain degree.  She has been seen by a urologist and referred back to physical therapy by her urologist at this time.  [name] states that she does not know what causes her pain but she will have pain out of the blue in her groin area bilaterally.  She is having to void from one and a half to three hour intravals    Pertinent History Two children both given birth naturally    Patient Stated Goals decreased pain.    Currently in  Pain? Yes   Pain Score 3   can increase to a 7 or 8    Pain Location Abdomen   Pain Orientation Right;Left;Anterior   Pain Descriptors / Indicators Throbbing   Pain Type Chronic pain   Pain Onset More than a month ago   Pain Frequency Intermittent   Aggravating Factors  unsure   Pain Relieving Factors rest, breathing techniques,    Effect of Pain on Daily Activities When pain is hight going to the rest room becomes urgent.             [facility] PT Assessment - 06/25/15 0001    Assessment   Medical Diagnosis Pelvic spasm   Referring Provider Dr. Lauralyn PrimesPatrik L McKenzie    Onset Date/Surgical Date 04/16/11   Hand Dominance Right   Prior Therapy yes: [facility] for 19 visits ; 6/20 thru 11/18/2014   Precautions   Precautions None   Restrictions   Weight Bearing Restrictions No   Balance Screen   Has the patient fallen in the past 6 months No   Has the patient had a decrease in activity level because of a fear of falling?  No   Is the patient reluctant to leave their home because of a fear of falling?  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 No   Prior Pregnancies Yes   Number of Pregnancies 2   Number of Vaginal Deliveries 2   Any difficulty with labor and deliveries No   Episiotomy Performed Yes   Currently Sexually Active Yes   Urinary Leakage No   Urinary urgency Yes   Urinary frequency 8   Fecal incontinence No   Caffeine beverages rare: 2x a week    Pelvic Pain Goals Patient  will be independent with completion of home exercise program for stretching and stabilization exercises.;Patient will be able to achieve a normal standing and sitting posture to help alleviate pain at joints and muscles.;Patient will be independent with self-management techniques including:;Patient will not report having urinary urgency throughout the day.;Patient will be able to have intercourse with minimal to no pain          [facility] Adult PT Treatment/Exercise - 06/25/15 0001    Exercises   Exercises Lumbar   Lumbar Exercises: Supine   Ab Set 10 reps   Heel Slides 5 reps  heel on mat    Other Supine Lumbar Exercises Kegal quick flicks x 10; slow 15 second hold 30 second rest x 5                 PT Education - 06/25/15 1254    Education provided Yes   Education Details Kegal both fast and slow muscular contraction, ab set and heelslides    Person(s) Educated Patient   Methods Explanation;Verbal cues;Handout   Comprehension Verbalized understanding;Returned demonstration          PT Short Term Goals - 06/25/15 1304    PT SHORT TERM GOAL #1   Title Patient will be independent  with initial HEP to decrease pain to no greater than a 5/10   Time 2   Period Weeks   Status New   PT SHORT TERM GOAL #2   Title Pt will be able to wait 2 hours befor having to void.    Time 2   Period Weeks   Status New   PT SHORT TERM GOAL #3   Title Pt will be able to jog for 30 minutes without the urge to void    Time 2   Period Weeks   Status New           PT Long Term Goals - 06/25/15 1307    PT LONG TERM GOAL #1   Title Pt will be I with more advanced HEP, self care to decrease pain to no greater than a 3/10   Time 4   Period Weeks   Status New   PT LONG TERM GOAL #2   Title Pt to be able to wait 3-4 hours before having to void   Time 4   Period Weeks   Status New   PT LONG TERM GOAL #3   Title Pt to be able to jog for  an hour without feeling the need to void   Time 4    Period Weeks   Status New   PT LONG TERM GOAL #4   Time 4   Period Weeks   Status New               Plan - 06/25/15 1256    Clinical Impression Statement [name] is a 20 yo female who has had pelvic pain for the last four years following the delevery of her first child.  She has been to therapy before for the same condition for 19 visists last year.  She states the therapy helped  but did not take all the pain away.  She continues to complete the exercises given to her.  She is now being referred again to therapy.  Examinatrion demonstrates weakened abdominal musculature.  She will benefit from skilled physical therapy to review and make sure that she is completing her HEP properly as well as adding new strengthening exercises.    Rehab Potential Fair  as pt has already been seen for 19 visits    PT Frequency 1x / week   PT Duration 4 weeks   PT Treatment/Interventions ADLs/Self Care Home Management;Manual techniques;Therapeutic activities;Therapeutic exercise;Functional mobility training   PT Next Visit Plan decrease to one time a week as pt has not insurance and it will be a financial hardship for the pt to pay for therapy.  Begin heel slide with leg off the flloor, hip abduction while keeping hip stable and stomach in, quadriped single leg and  opposite arm/leg lift;  Progress to T band exercises and shoulder curls.    PT Home Exercise Plan given basic    Consulted and Agree with Plan of Care Patient      Patient will benefit from skilled therapeutic intervention in order to improve the following deficits and impairments:  Pain, Decreased strength, Increased muscle spasms, Other (comment) (urge incontinence )  Visit Diagnosis: Pain in right hip  Pain in the hip, left  Other symptoms and signs involving the genitourinary system     Problem List Patient Active Problem List   Diagnosis Date Noted  . Cephalalgia 06/09/2015  . Urinary frequency 12/08/2014  .  Adjustment disorder with anxious mood 11/03/2014  . 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 Chronic abdominal pain 05/03/2014  . Constipation 05/03/2014  . Paresthesia 02/06/2014  . Teenage mother 02/03/2014    Virgina Organ, PT CLT [id number] 06/25/2015, 2:15 PM  [facility] [address] [place], [place], [address] Phone: [phone]   Fax:  [phone]  Name: LATOIYA MARADIAGA MRN: [id number] Date of Birth: 04-26-1995

## 2015-06-26 ENCOUNTER — Ambulatory Visit: Payer: Self-pay | Admitting: Family

## 2015-06-26 ENCOUNTER — Telehealth (HOSPITAL_COMMUNITY): Payer: Self-pay | Admitting: Physical Therapy

## 2015-06-26 NOTE — Telephone Encounter (Signed)
called regarding appointments for therapy.  Pt only approved for 4 visits, 1X week and has 2 appt per week scheduled for the next 3 weeks.  Left message for patient to call clinic to remove 1 appt each week. Lurena NidaAmy B Marshea Wisher, PTA/CLT [phone]

## 2015-06-27 ENCOUNTER — Telehealth (HOSPITAL_COMMUNITY): Payer: Self-pay | Admitting: Physical Therapy

## 2015-06-27 ENCOUNTER — Ambulatory Visit (HOSPITAL_COMMUNITY): Payer: Self-pay | Admitting: Physical Therapy

## 2015-06-27 NOTE — Telephone Encounter (Signed)
Pt did not show for appointment or return therapist call from day before regarding her schedule.  Called again and left message to contact clinic ASAP regarding her appointments (needs to decrease to 1X week).   Lurena NidaAmy B Brennan Karam, PTA/CLT [phone]

## 2015-07-02 ENCOUNTER — Telehealth (HOSPITAL_COMMUNITY): Payer: Self-pay | Admitting: Physical Therapy

## 2015-07-02 ENCOUNTER — Ambulatory Visit (HOSPITAL_COMMUNITY): Payer: Self-pay | Admitting: Physical Therapy

## 2015-07-02 NOTE — Telephone Encounter (Signed)
Left message on pt phone.  This is her third no show and per policy we will be cancelling the remainder of her appointments.  Jamie Lutz, PT CLT [phone]

## 2015-07-03 ENCOUNTER — Other Ambulatory Visit: Payer: Self-pay | Admitting: Pediatrics

## 2015-07-04 ENCOUNTER — Ambulatory Visit (HOSPITAL_COMMUNITY): Payer: Self-pay | Admitting: Physical Therapy

## 2015-07-04 ENCOUNTER — Other Ambulatory Visit: Payer: Self-pay | Admitting: Pediatrics

## 2015-07-04 MED ORDER — FLUOXETINE HCL 20 MG PO CAPS: 20.0000 mg | ORAL_CAPSULE | Freq: Every day | ORAL | Status: DC

## 2015-07-09 ENCOUNTER — Encounter (HOSPITAL_COMMUNITY): Payer: Self-pay | Admitting: Physical Therapy

## 2015-07-10 ENCOUNTER — Encounter (HOSPITAL_COMMUNITY): Payer: Self-pay | Admitting: Physical Therapy

## 2015-07-16 ENCOUNTER — Encounter (HOSPITAL_COMMUNITY): Payer: Self-pay | Admitting: Physical Therapy

## 2015-07-18 ENCOUNTER — Encounter (HOSPITAL_COMMUNITY): Payer: Self-pay | Admitting: Physical Therapy

## 2015-08-08 ENCOUNTER — Encounter: Payer: Self-pay | Admitting: Pediatrics

## 2015-08-08 ENCOUNTER — Ambulatory Visit (INDEPENDENT_AMBULATORY_CARE_PROVIDER_SITE_OTHER): Payer: No Typology Code available for payment source | Admitting: Pediatrics

## 2015-08-08 VITALS — Temp 98.3°F | Wt 138.0 lb

## 2015-08-08 DIAGNOSIS — H6092 Unspecified otitis externa, left ear: Secondary | ICD-10-CM

## 2015-08-08 MED ORDER — PREDNISOLONE ACETATE 1 % OP SUSP: OPHTHALMIC | Status: AC

## 2015-08-08 MED ORDER — OFLOXACIN 0.3 % OT SOLN: 10.0000 [drp] | Freq: Every day | OTIC | Status: AC

## 2015-08-08 NOTE — Progress Notes (Signed)
  Subjective:    [name] is a 20 y.o. old female here  for Otalgia and Fever .   Chief Complaint  Patient presents with  . Otalgia    Started as one and now its both, Ear draining and hurting really bad. Pt put ear drops for pain relief.  . Fever    last night and this morning     HPI  Left ear pain since 08/06/15. Also with watery drainage fro approx 2 weeks.  No swimming or other risk factors.   A "prickling" feeling over body yesterday but no documented fevers.   Review of Systems  Constitutional: Negative for activity change and appetite change.    Immunizations needed: none     Objective:    Temp(Src) 98.3 F (36.8 C)  Wt 138 lb (62.596 kg)  LMP 07/23/2015 (Approximate) Physical Exam  Constitutional: She appears well-developed and well-nourished.  HENT:  Right ear normal.  Pain on motion of tragus and pinnae Left canal mildly swollen with some cobblestoning.   Cardiovascular: Normal rate.   Pulmonary/Chest: Effort normal.  Skin: No rash noted.       Assessment and Plan:     [name] was seen today for Otalgia and Fever .   Problem List Items Addressed This Visit    None    Visit Diagnoses    Otitis externa, left    -  Primary    Relevant Medications    ofloxacin (FLOXIN) 0.3 % otic solution    prednisoLONE acetate (PRED FORTE) 1 % ophthalmic suspension      Otitis externa in patient with no insurance - Ciprodex is approx $200 out of pocket. Unable to fully visualize ear drum to r/o perforation. Disucssed with pharmacy - will use ofloxacin otic along with prednisolone for use in EAR> These prescriptions are available at [facility].   2 days recheck ear.   Dory PeruBROWN,Mikya Don R, MD

## 2015-08-10 ENCOUNTER — Ambulatory Visit: Payer: No Typology Code available for payment source | Admitting: Pediatrics

## 2015-08-28 ENCOUNTER — Ambulatory Visit (INDEPENDENT_AMBULATORY_CARE_PROVIDER_SITE_OTHER): Payer: No Typology Code available for payment source | Admitting: Licensed Clinical Social Worker

## 2015-08-28 ENCOUNTER — Ambulatory Visit (INDEPENDENT_AMBULATORY_CARE_PROVIDER_SITE_OTHER): Payer: No Typology Code available for payment source | Admitting: Pediatrics

## 2015-08-28 ENCOUNTER — Encounter: Payer: Self-pay | Admitting: Pediatrics

## 2015-08-28 VITALS — BP 100/70 | HR 74 | Wt 139.0 lb

## 2015-08-28 DIAGNOSIS — R079 Chest pain, unspecified: Secondary | ICD-10-CM | POA: Insufficient documentation

## 2015-08-28 DIAGNOSIS — R0789 Other chest pain: Secondary | ICD-10-CM

## 2015-08-28 DIAGNOSIS — Z3201 Encounter for pregnancy test, result positive: Secondary | ICD-10-CM

## 2015-08-28 DIAGNOSIS — Z331 Pregnant state, incidental: Secondary | ICD-10-CM

## 2015-08-28 DIAGNOSIS — Z3490 Encounter for supervision of normal pregnancy, unspecified, unspecified trimester: Secondary | ICD-10-CM

## 2015-08-28 LAB — POCT URINE PREGNANCY: Preg Test, Ur: POSITIVE — AB

## 2015-08-28 MED ORDER — PRENATAL VITAMINS 0.8 MG PO TABS: 1.0000 | ORAL_TABLET | Freq: Every day | ORAL | 12 refills | Status: DC

## 2015-08-28 NOTE — Progress Notes (Signed)
Subjective:    Jamie Lutz is a 20 y.o. old female here  for Chest Pain (x 2 weeks, no other symptoms ) .   Pl[facility]. Mom is 21 and has 2 children of her own-ages 4 and 3.  HPI   Jamie Lutz presents here today with complaint of chest pain x 2 weeks. She has never had chest pain in the past. This has occurred every day for the past 2 weeks. It occurs once daily. The time of day varies. There is no known trigger but it might occur after eating. She describes the episode as right sided chest pain with a rapid heart rate-normal rhythm. She feels shaky after and tired. She does not feel short of breath. She has some pain in her right shoulder. The symptoms last 10-20 minutes. She sits until it goes away. This is not associated with exercise. This is not associated with a loud noise or changing from a sitting to standing position. She thinks it might be related to eating fried foods. She does not feel like she is going to pass out. She has never passed out. The episodes might be related to stress.   Recently she has gained weight. She reports mild constipation. She reports cold intolerance. Thyroid studies normal 04/2015 and 01/2014. She is not taking any birth control and is sexually active. Last period 1 month ago.-07/23/15.  No epigastric pain.   Recent URI 2 weeks ago.  Review of Systems  History and Problem List: Jamie Lutz has Teenage mother; Paresthesia; Myofascial abdominal wall pain; Chronic abdominal pain; Constipation; Adjustment disorder with anxious mood; Urinary frequency; Cephalalgia; and Chest pain on her problem list.  Jamie Lutz  has a past medical history of No pertinent past medical history.  Immunizations needed: needs Hep A-cannot give here. [facility]     Objective:    BP 100/70 (BP Location: Left Arm, Patient Position: Sitting, Cuff Size: Normal)   Pulse 74   Wt 139 lb (63 kg)   LMP 07/23/2015 (Approximate)   BMI  23.86 kg/m  Physical Exam  Constitutional: She appears well-developed and well-nourished. No distress.  HENT:  Mouth/Throat: Oropharynx is clear and moist.  Eyes: Conjunctivae are normal.  Neck: Neck supple. No thyromegaly present.  Cardiovascular: Normal rate, regular rhythm, normal heart sounds and intact distal pulses.  Exam reveals no gallop and no friction rub.   No murmur heard. Pulmonary/Chest: Effort normal and breath sounds normal. No respiratory distress. She has no rales. She exhibits tenderness.  Reproducible chest tenderness to palpation of sternal border-right and left  Abdominal: Soft. Bowel sounds are normal. She exhibits no distension.  Lymphadenopathy:    She has no cervical adenopathy.  Skin: No rash noted.       Assessment and Plan:   Jamie Lutz is a 20 y.o. old female with chest pain and abnormal heart rate. She has a positive pregnancy test today.  1. Other chest pain Urine pregnancy was positive today. This can be source of symptoms but must also r/o cardiac etiology and thyroid abnormality. Chest pain could also be secondary to GER or costochondritis. Patient has been instructed to eat smaller more frequent meals and eliminate fried foods until tests completed. - POCT urine pregnancy - EKG 12-Lead scheduled for tomorrrow - TSH - T4, free  2. Pregnancy Patient is on prozac and will continue for now. Risks and benefits reviewed with patient today. The risks of stopping SSRI needs  to be determined by PCP and Ob Gyn at follow up. - Prenatal Multivit-Min-Fe-FA (PRENATAL VITAMINS) 0.8 MG tablet; Take 1 tablet by mouth daily.  Dispense: 30 tablet; Refill: 12 [facility] to see today and assist in making Ob Gyn appointment.  RTC in 10 days to review lab results with PCP and confirm that OB Gyn care has been established.    Medical decision-making:  > 25 minutes spent, more than 50% of appointment was spent discussing diagnosis and management of symptoms.   Return in about  10 days (around 09/07/2015) for follow up with Dr. Manson Passey.  Jairo Ben, MD

## 2015-08-28 NOTE — BH Specialist Note (Signed)
Session Time:  11:11 - 11:30 (19 min) Type of Service: Behavioral Health - Individual/Family Interpreter: No.   Interpreter Name & Language: NA # [facility]: 0 before today, history of BH involvement    SUBJECTIVE: Jamie Lutz is a 20 y.o. female brought in by herself and joined by two young sons.  Pt. was referred by : Dr. Kalman JewelsShannon McQueen for positive pregnancy test.  Pt. reports the following symptoms/concerns: Unplanned pregnancy Duration of problem:  0 however patient has been having unprotected sex for a while and has presented to the ED in March for pregnancy test Severity: mild- Patient admits surprise but states she knows what to do.    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 (Where, how often, or financial support) Self-Care: Jamie HesselbachMaria has had extensive BH involvement, however, when asked about how she copes with stress, she has a hard time naming coping skills. She eventually states that getting outside and putting up her phone are helpful. She is reminded that Prozac helps with anxiety. She is not 100% compliant with medications and does miss doses. (exercise, sleep, eat, substances) Life changes since last visit: Pregnant.   GOALS ADDRESSED:  Increase adequate supports and resources.  ASSESSMENT: Pt currently experiencing unplanned pregnancy with two young children already.  Pt may/ would benefit from ongoing counseling, however, patient [facility] and continues to have limited insight/knowledge. She would likely benefit from a program that goes into the home and provides a higher level of care since she continues to struggle and  forgets to take meds.  Complete plan from last visit? No. Continues to struggle with anxiety.    PLAN: 1. F/U with behavioral health clinician as needed. Pt will call the health department/[facility] to see where she can get care.  2. Behavioral recommendations:  Take medications as prescribed. Get connected to OB services ASAP. Connect to adult care ASAP. 3. Referral: see above 4. From scale of 1-10, how likely are you to follow plan: Jamie HesselbachMaria is at a 10 that she will call and find an OB today. She declined to make any of those calls together today.    Jamie Lutz Jamie Lutz Jamie Lutz LCSWA Behavioral Health Clinician [facility]

## 2015-08-28 NOTE — Patient Instructions (Addendum)
Your chest pain can be related to this pregnancy. Labs have been sent and an EKG ordered to look at other possibilities.  Please start taking prenatal vitamins as soon as possible. A prescription has been sent to your pharmacy. Please continue the pr  Thyroid studies were ordered today and you should have an EKG prior to your follow up next week.  It is time to establish care with an adult facility and an OB/GYN. Dr. Manson PasseyBrown can review the test results with you next week. Ob Gyn care options to be given to you today.

## 2015-08-29 ENCOUNTER — Other Ambulatory Visit (HOSPITAL_COMMUNITY): Payer: Self-pay

## 2015-08-29 ENCOUNTER — Ambulatory Visit (HOSPITAL_COMMUNITY)
Admission: RE | Admit: 2015-08-29 | Discharge: 2015-08-29 | Disposition: A | Discharge status: Home / Self Care | Payer: Self-pay | Source: Ambulatory Visit | Attending: Pediatrics | Admitting: Pediatrics

## 2015-08-29 DIAGNOSIS — R079 Chest pain, unspecified: Secondary | ICD-10-CM | POA: Insufficient documentation

## 2015-08-29 LAB — T4, FREE: Free T4: 1.1 ng/dL (ref 0.8–1.8)

## 2015-08-29 LAB — TSH: TSH: 0.58 mIU/L

## 2015-09-07 ENCOUNTER — Ambulatory Visit: Payer: No Typology Code available for payment source | Admitting: Student

## 2015-09-11 ENCOUNTER — Encounter (HOSPITAL_COMMUNITY): Payer: Self-pay

## 2015-09-11 DIAGNOSIS — Z5321 Procedure and treatment not carried out due to patient leaving prior to being seen by health care provider: Secondary | ICD-10-CM | POA: Insufficient documentation

## 2015-09-11 DIAGNOSIS — Z3A01 Less than 8 weeks gestation of pregnancy: Secondary | ICD-10-CM | POA: Insufficient documentation

## 2015-09-11 DIAGNOSIS — O26891 Other specified pregnancy related conditions, first trimester: Secondary | ICD-10-CM | POA: Insufficient documentation

## 2015-09-11 DIAGNOSIS — R1032 Left lower quadrant pain: Secondary | ICD-10-CM | POA: Insufficient documentation

## 2015-09-11 LAB — COMPREHENSIVE METABOLIC PANEL
ALBUMIN: 3.6 g/dL (ref 3.5–5.0)
ALT: 14 U/L (ref 14–54)
ANION GAP: 5 (ref 5–15)
AST: 19 U/L (ref 15–41)
Alkaline Phosphatase: 61 U/L (ref 38–126)
BUN: 8 mg/dL (ref 6–20)
CALCIUM: 9 mg/dL (ref 8.9–10.3)
CO2: 25 mmol/L (ref 22–32)
Chloride: 105 mmol/L (ref 101–111)
Creatinine, Ser: 0.67 mg/dL (ref 0.44–1.00)
GFR calc Af Amer: >60 mL/min (ref >60)
GFR calc non Af Amer: >60 mL/min (ref >60)
GLUCOSE: 91 mg/dL (ref 65–99)
POTASSIUM: 3.8 mmol/L (ref 3.5–5.1)
SODIUM: 135 mmol/L (ref 135–145)
TOTAL PROTEIN: 6.2 g/dL — AB (ref 6.5–8.1)
Total Bilirubin: 0.3 mg/dL (ref 0.3–1.2)

## 2015-09-11 LAB — CBC
HCT: 38.9 % (ref 36.0–46.0)
HEMOGLOBIN: 12.4 g/dL (ref 12.0–15.0)
MCH: 28.1 pg (ref 26.0–34.0)
MCHC: 31.9 g/dL (ref 30.0–36.0)
MCV: 88.2 fL (ref 78.0–100.0)
PLATELETS: 171 10*3/uL (ref 150–400)
RBC: 4.41 MIL/uL (ref 3.87–5.11)
RDW: 13.7 % (ref 11.5–15.5)
WBC: 8.5 10*3/uL (ref 4.0–10.5)

## 2015-09-11 LAB — LIPASE, BLOOD: Lipase: 26 U/L (ref 11–51)

## 2015-09-11 LAB — I-STAT BETA HCG BLOOD, ED (MC, WL, AP ONLY)

## 2015-09-11 LAB — URINALYSIS, ROUTINE W REFLEX MICROSCOPIC
BILIRUBIN URINE: NEGATIVE
Glucose, UA: NEGATIVE mg/dL
HGB URINE DIPSTICK: NEGATIVE
Ketones, ur: NEGATIVE mg/dL
Leukocytes, UA: NEGATIVE
NITRITE: NEGATIVE
PROTEIN: NEGATIVE mg/dL
SPECIFIC GRAVITY, URINE: 1.025 (ref 1.005–1.030)
pH: 6.5 (ref 5.0–8.0)

## 2015-09-11 NOTE — ED Triage Notes (Signed)
Onset 2 days ago vaginal bleeding when wiping x 1 and yesterday x 1.  Onset 1 week abd pain LLQ.  Pt is [redacted] weeks pregnant.

## 2015-09-12 ENCOUNTER — Emergency Department (HOSPITAL_COMMUNITY)
Admission: EM | Admit: 2015-09-12 | Discharge: 2015-09-12 | Disposition: A | Discharge status: Home / Self Care | Payer: Self-pay | Attending: Emergency Medicine | Admitting: Emergency Medicine

## 2015-09-12 ENCOUNTER — Emergency Department (HOSPITAL_COMMUNITY): Payer: Self-pay

## 2015-09-12 DIAGNOSIS — R109 Unspecified abdominal pain: Secondary | ICD-10-CM

## 2015-09-12 HISTORY — DX: Anxiety disorder, unspecified: F41.9

## 2015-09-12 NOTE — ED Notes (Signed)
Patient called for US, no answer. Patient never presented to nurses desk to inform of intent to leave.

## 2015-09-18 ENCOUNTER — Inpatient Hospital Stay (HOSPITAL_COMMUNITY)
Admission: AD | Admit: 2015-09-18 | Discharge: 2015-09-18 | Disposition: A | Discharge status: Home / Self Care | Payer: Self-pay | Source: Ambulatory Visit | Attending: Obstetrics and Gynecology | Admitting: Obstetrics and Gynecology

## 2015-09-18 ENCOUNTER — Encounter (HOSPITAL_COMMUNITY): Payer: Self-pay

## 2015-09-18 DIAGNOSIS — O26851 Spotting complicating pregnancy, first trimester: Secondary | ICD-10-CM

## 2015-09-18 DIAGNOSIS — Z3A08 8 weeks gestation of pregnancy: Secondary | ICD-10-CM | POA: Insufficient documentation

## 2015-09-18 DIAGNOSIS — Z833 Family history of diabetes mellitus: Secondary | ICD-10-CM | POA: Insufficient documentation

## 2015-09-18 DIAGNOSIS — F419 Anxiety disorder, unspecified: Secondary | ICD-10-CM | POA: Insufficient documentation

## 2015-09-18 DIAGNOSIS — O99341 Other mental disorders complicating pregnancy, first trimester: Secondary | ICD-10-CM | POA: Insufficient documentation

## 2015-09-18 DIAGNOSIS — Z79899 Other long term (current) drug therapy: Secondary | ICD-10-CM | POA: Insufficient documentation

## 2015-09-18 LAB — WET PREP, GENITAL
Clue Cells Wet Prep HPF POC: NONE SEEN
SPERM: NONE SEEN
Trich, Wet Prep: NONE SEEN
Yeast Wet Prep HPF POC: NONE SEEN

## 2015-09-18 LAB — URINALYSIS, ROUTINE W REFLEX MICROSCOPIC
BILIRUBIN URINE: NEGATIVE
GLUCOSE, UA: NEGATIVE mg/dL
HGB URINE DIPSTICK: NEGATIVE
KETONES UR: NEGATIVE mg/dL
LEUKOCYTES UA: NEGATIVE
NITRITE: NEGATIVE
PH: 6 (ref 5.0–8.0)
Protein, ur: NEGATIVE mg/dL
Specific Gravity, Urine: 1.02 (ref 1.005–1.030)

## 2015-09-18 NOTE — MAU Provider Note (Signed)
History     CSN: [id number]  Arrival date and time: 09/18/15 [id number]   First Provider Initiated Contact with Patient 09/18/15 [id number]      Chief Complaint  Patient presents with  . Vaginal Bleeding   HPI Ms Jamie Lutz is a 19yo [id number] @ 8.1wks by certain LMP who presents for eval due to spotting x 2, both 9 days ago with none since. Denies pain or dysuria; no fever or abnl vag discharge. [facility] to begin prenatal care. Blood type O+.  OB History    Gravida Para Term Preterm AB Living   3 2 2  0 0 2   SAB TAB Ectopic Multiple Live Births   0 0 0 0 2      Past Medical History:  Diagnosis Date  . Anxiety   . No pertinent past medical history     Past Surgical History:  Procedure Laterality Date  . NO PAST SURGERIES    . PERINEAL LACERATION REPAIR  04/17/2011   Procedure: SUTURE REPAIR PERINEAL LACERATION;  Surgeon: Tilda BurrowJohn V Ferguson, MD;  Location: [facility];  Service: Gynecology;  Laterality: N/A;    Family History  Problem Relation Age of Onset  . Diabetes Father     Social History  Substance Use Topics  . Smoking status: Never Smoker  . Smokeless tobacco: Never Used  . Alcohol use No    Allergies: No Known Allergies  Prescriptions Prior to Admission  Medication Sig Dispense Refill Last Dose  . FLUoxetine (PROZAC) 20 MG capsule Take 1 capsule (20 mg total) by mouth daily. 30 capsule 3 09/18/2015 at 1800  . Prenatal Multivit-Min-Fe-FA (PRENATAL VITAMINS) 0.8 MG tablet Take 1 tablet by mouth daily. 30 tablet 12 09/18/2015 at 1800  . naproxen (NAPROSYN) 375 MG tablet Take 1 tablet (375 mg total) by mouth 2 (two) times daily with a meal. (Patient not taking: Reported on 08/08/2015) 20 tablet 0 Not Taking    ROS Physical Exam   Blood pressure 106/67, pulse 72, temperature 98.3 F (36.8 C), temperature source Oral, resp. rate 18, last menstrual period 07/23/2015, unknown if currently breastfeeding.  Physical Exam  Constitutional: She is  oriented to person, place, and time. She appears well-developed.  HENT:  Head: Normocephalic.  Neck: Normal range of motion.  Cardiovascular: Normal rate.   Respiratory: Effort normal.  GI: Soft.  Genitourinary: Vagina normal.  Genitourinary Comments: Uterus approx 8wk size, NT; cx NT, closed; sm white d/c- no blood seen  Musculoskeletal: Normal range of motion.  Neurological: She is alert and oriented to person, place, and time.  Skin: Skin is warm and dry.  Psychiatric: She has a normal mood and affect. Her behavior is normal. Thought content normal.   Urinalysis    Component Value Date/Time   COLORURINE YELLOW 09/18/2015 2005   APPEARANCEUR CLEAR 09/18/2015 2005   LABSPEC 1.020 09/18/2015 2005   PHURINE 6.0 09/18/2015 2005   GLUCOSEU NEGATIVE 09/18/2015 2005   HGBUR NEGATIVE 09/18/2015 2005   BILIRUBINUR NEGATIVE 09/18/2015 2005   BILIRUBINUR negative 06/09/2015 0908   KETONESUR NEGATIVE 09/18/2015 2005   PROTEINUR NEGATIVE 09/18/2015 2005   UROBILINOGEN negative 06/09/2015 0908   UROBILINOGEN 0.2 03/20/2014 1640   NITRITE NEGATIVE 09/18/2015 2005   LEUKOCYTESUR NEGATIVE 09/18/2015 2005   Wet prep: many WBC, few bacteria  MAU Course  Procedures  UA, wet prep, GC/chlam ordered  Assessment and Plan  Early preg 8weeks by LMP Previous spotting  D/C home Keep appt  at [facility] for 09/28/15  Cam Hai CNM 09/18/2015, 10:12 PM

## 2015-09-18 NOTE — MAU Note (Signed)
Pt presents complaining of vaginal bleeding when she wipes that started last Sunday. Also having lower abdominal discomfort. Also having increased liquid discharge.

## 2015-09-18 NOTE — Discharge Instructions (Signed)
Hemorragia vaginal durante el embarazo (primer trimestre)  (Vaginal Bleeding During Pregnancy, First Trimester)  Durante los primeros meses de embarazo, es común tener una pequeña hemorragia vaginal (manchas). A veces, la hemorragia es normal y no representa un problema, pero en algunas ocasiones es un síntoma de algo grave. Asegúrese de decirle a su médico de inmediato si tiene algún tipo de hemorragia vaginal.  CUIDADOS EN EL HOGAR  · Controle su afección para ver si hay cambios.  · Siga las indicaciones de su médico con respecto al grado de actividad que puede tener.  · Si debe hacer reposo en cama:    Es posible que deba quedarse en cama y levantarse únicamente para ir al baño.    Quizás le permitan hacer algunas actividades.    Si es necesario, planifique que alguien la ayude.  · Escriba:    La cantidad de toallas higiénicas que usa cada día.    La frecuencia con la que se cambia las toallas higiénicas.    Indique que tan empapados (saturados) están.  · No use tampones.  · No se haga duchas vaginales.  · No tenga relaciones sexuales ni orgasmos hasta que el médico la autorice.  · Si elimina tejido por la vagina, guárdelo para mostrárselo al médico.  · Tome los medicamentos solamente como se lo haya indicado el médico.  · No tome aspirina, ya que puede causar hemorragias.  · Concurra a todas las visitas de control como se lo haya indicado el médico.  SOLICITE AYUDA SI:   · Tiene una hemorragia vaginal.  · Tiene cólicos.  · Tiene dolores de parto.  · Tiene fiebre que no desaparece después de tomar medicamentos.  SOLICITE AYUDA DE INMEDIATO SI:   · Siente cólicos muy intensos en la espalda o en el vientre (abdomen).  · Elimina coágulos grandes o tejido por la vagina.  · Tiene más hemorragia.  · Se siente débil o que va a desvanecerse.  · Pierde el conocimiento (se desmaya).  · Tiene escalofríos.  · Tiene una pérdida importante o sale líquido a borbotones por la vagina.  · Se desmaya mientras defeca.  ASEGÚRESE DE  QUE:  · Comprende estas instrucciones.  · Controlará su afección.  · Recibirá ayuda de inmediato si no mejora o si empeora.     Esta información no tiene como fin reemplazar el consejo del médico. Asegúrese de hacerle al médico cualquier pregunta que tenga.     Document Released: 05/23/2013  Elsevier Interactive Patient Education ©2016 Elsevier Inc.

## 2015-09-18 NOTE — MAU Note (Signed)
Pt c/o spotting when wiping since Sunday. States that she "feels bloated." States some nausea but no vomiting. States that she sometimes has burning with urination.

## 2015-09-19 LAB — GC/CHLAMYDIA PROBE AMP (~~LOC~~) NOT AT ARMC
Chlamydia: NEGATIVE
Neisseria Gonorrhea: NEGATIVE

## 2015-10-04 ENCOUNTER — Other Ambulatory Visit: Payer: Self-pay | Admitting: Obstetrics & Gynecology

## 2015-10-04 DIAGNOSIS — O3680X0 Pregnancy with inconclusive fetal viability, not applicable or unspecified: Secondary | ICD-10-CM

## 2015-10-11 ENCOUNTER — Other Ambulatory Visit: Payer: Self-pay

## 2015-10-18 ENCOUNTER — Other Ambulatory Visit: Payer: Self-pay

## 2015-10-18 ENCOUNTER — Ambulatory Visit (INDEPENDENT_AMBULATORY_CARE_PROVIDER_SITE_OTHER): Payer: Medicaid Other

## 2015-10-18 ENCOUNTER — Ambulatory Visit: Payer: Self-pay | Admitting: Physician Assistant

## 2015-10-18 ENCOUNTER — Other Ambulatory Visit: Payer: Self-pay | Admitting: Obstetrics & Gynecology

## 2015-10-18 DIAGNOSIS — Z3A13 13 weeks gestation of pregnancy: Secondary | ICD-10-CM | POA: Diagnosis not present

## 2015-10-18 DIAGNOSIS — O3680X0 Pregnancy with inconclusive fetal viability, not applicable or unspecified: Secondary | ICD-10-CM

## 2015-10-18 DIAGNOSIS — Z3682 Encounter for antenatal screening for nuchal translucency: Secondary | ICD-10-CM

## 2015-10-18 NOTE — Progress Notes (Signed)
US 12+3 wks,single IUP,crl 58.1 mm,NB present,NT 1.1 mm,fhr 162 bpm,normal ov's bilat

## 2015-10-23 ENCOUNTER — Encounter: Payer: Self-pay | Admitting: Physician Assistant

## 2015-10-23 LAB — MATERNAL SCREEN, INTEGRATED #1
Crown Rump Length: 58.1 mm
Gest. Age on Collection Date: 12.3 weeks
Maternal Age at EDD: 22.4 years
Nuchal Translucency (NT): 1.1 mm
Number of Fetuses: 1
PAPP-A VALUE: 278 ng/mL
Weight: 145 [lb_av]

## 2015-11-02 ENCOUNTER — Encounter: Payer: Self-pay | Admitting: Women's Health

## 2015-11-15 ENCOUNTER — Other Ambulatory Visit (HOSPITAL_COMMUNITY)
Admission: RE | Admit: 2015-11-15 | Discharge: 2015-11-15 | Disposition: A | Discharge status: Home / Self Care | Payer: Self-pay | Source: Ambulatory Visit | Attending: Obstetrics & Gynecology | Admitting: Obstetrics & Gynecology

## 2015-11-15 ENCOUNTER — Encounter: Payer: Self-pay | Admitting: Women's Health

## 2015-11-15 ENCOUNTER — Ambulatory Visit (INDEPENDENT_AMBULATORY_CARE_PROVIDER_SITE_OTHER): Payer: Self-pay | Admitting: Women's Health

## 2015-11-15 VITALS — BP 100/62 | HR 76 | Wt 147.0 lb

## 2015-11-15 DIAGNOSIS — Z01419 Encounter for gynecological examination (general) (routine) without abnormal findings: Secondary | ICD-10-CM

## 2015-11-15 DIAGNOSIS — O0990 Supervision of high risk pregnancy, unspecified, unspecified trimester: Secondary | ICD-10-CM | POA: Insufficient documentation

## 2015-11-15 DIAGNOSIS — Z3A17 17 weeks gestation of pregnancy: Secondary | ICD-10-CM

## 2015-11-15 DIAGNOSIS — Z113 Encounter for screening for infections with a predominantly sexual mode of transmission: Secondary | ICD-10-CM | POA: Insufficient documentation

## 2015-11-15 DIAGNOSIS — Z23 Encounter for immunization: Secondary | ICD-10-CM | POA: Diagnosis not present

## 2015-11-15 DIAGNOSIS — Z363 Encounter for antenatal screening for malformations: Secondary | ICD-10-CM

## 2015-11-15 DIAGNOSIS — Z1389 Encounter for screening for other disorder: Secondary | ICD-10-CM

## 2015-11-15 DIAGNOSIS — Z331 Pregnant state, incidental: Secondary | ICD-10-CM | POA: Diagnosis not present

## 2015-11-15 DIAGNOSIS — Z3482 Encounter for supervision of other normal pregnancy, second trimester: Secondary | ICD-10-CM | POA: Diagnosis not present

## 2015-11-15 DIAGNOSIS — O0932 Supervision of pregnancy with insufficient antenatal care, second trimester: Secondary | ICD-10-CM | POA: Diagnosis not present

## 2015-11-15 DIAGNOSIS — Z3682 Encounter for antenatal screening for nuchal translucency: Secondary | ICD-10-CM

## 2015-11-15 DIAGNOSIS — Z0283 Encounter for blood-alcohol and blood-drug test: Secondary | ICD-10-CM

## 2015-11-15 LAB — POCT URINALYSIS DIPSTICK
Glucose, UA: NEGATIVE
Ketones, UA: NEGATIVE
Leukocytes, UA: NEGATIVE
NITRITE UA: NEGATIVE
PROTEIN UA: NEGATIVE
RBC UA: NEGATIVE

## 2015-11-15 NOTE — Patient Instructions (Signed)
Constipation  Drink plenty of fluid, preferably water, throughout the day  Eat foods high in fiber such as fruits, vegetables, and grains  Exercise, such as walking, is a good way to keep your bowels regular  Drink warm fluids, especially warm prune juice, or decaf coffee  Eat a 1/2 cup of real oatmeal (not instant), 1/2 cup applesauce, and 1/2-1 cup warm prune juice every day  If needed, you may take Colace (docusate sodium) stool softener once or twice a day to help keep the stool soft. If you are pregnant, wait until you are out of your first trimester (12-14 weeks of pregnancy)  If you still are having problems with constipation, you may take Miralax once daily as needed to help keep your bowels regular.  If you are pregnant, wait until you are out of your first trimester (12-14 weeks of pregnancy)   Second Trimester of Pregnancy The second trimester is from week 13 through week 28, months 4 through 6. The second trimester is often a time when you feel your best. Your body has also adjusted to being pregnant, and you begin to feel better physically. Usually, morning sickness has lessened or quit completely, you may have more energy, and you may have an increase in appetite. The second trimester is also a time when the fetus is growing rapidly. At the end of the sixth month, the fetus is about 9 inches long and weighs about 1 pounds. You will likely begin to feel the baby move (quickening) between 18 and 20 weeks of the pregnancy. BODY CHANGES Your body goes through many changes during pregnancy. The changes vary from woman to woman.   Your weight will continue to increase. You will notice your lower abdomen bulging out.  You may begin to get stretch marks on your hips, abdomen, and breasts.  You may develop headaches that can be relieved by medicines approved by your health care provider.  You may urinate more often because the fetus is pressing on your bladder.  You may develop or  continue to have heartburn as a result of your pregnancy.  You may develop constipation because certain hormones are causing the muscles that push waste through your intestines to slow down.  You may develop hemorrhoids or swollen, bulging veins (varicose veins).  You may have back pain because of the weight gain and pregnancy hormones relaxing your joints between the bones in your pelvis and as a result of a shift in weight and the muscles that support your balance.  Your breasts will continue to grow and be tender.  Your gums may bleed and may be sensitive to brushing and flossing.  Dark spots or blotches (chloasma, mask of pregnancy) may develop on your face. This will likely fade after the baby is born.  A dark line from your belly button to the pubic area (linea nigra) may appear. This will likely fade after the baby is born.  You may have changes in your hair. These can include thickening of your hair, rapid growth, and changes in texture. Some women also have hair loss during or after pregnancy, or hair that feels dry or thin. Your hair will most likely return to normal after your baby is born. WHAT TO EXPECT AT YOUR PRENATAL VISITS During a routine prenatal visit:  You will be weighed to make sure you and the fetus are growing normally.  Your blood pressure will be taken.  Your abdomen will be measured to track your baby's growth.    The fetal heartbeat will be listened to.  Any test results from the previous visit will be discussed. Your health care provider may ask you:  How you are feeling.  If you are feeling the baby move.  If you have had any abnormal symptoms, such as leaking fluid, bleeding, severe headaches, or abdominal cramping.  If you are using any tobacco products, including cigarettes, chewing tobacco, and electronic cigarettes.  If you have any questions. Other tests that may be performed during your second trimester include:  Blood tests that check  for:  Low iron levels (anemia).  Gestational diabetes (between 24 and 28 weeks).  Rh antibodies.  Urine tests to check for infections, diabetes, or protein in the urine.  An ultrasound to confirm the proper growth and development of the baby.  An amniocentesis to check for possible genetic problems.  Fetal screens for spina bifida and Down syndrome.  HIV (human immunodeficiency virus) testing. Routine prenatal testing includes screening for HIV, unless you choose not to have this test. HOME CARE INSTRUCTIONS   Avoid all smoking, herbs, alcohol, and unprescribed drugs. These chemicals affect the formation and growth of the baby.  Do not use any tobacco products, including cigarettes, chewing tobacco, and electronic cigarettes. If you need help quitting, ask your health care provider. You may receive counseling support and other resources to help you quit.  Follow your health care provider's instructions regarding medicine use. There are medicines that are either safe or unsafe to take during pregnancy.  Exercise only as directed by your health care provider. Experiencing uterine cramps is a good sign to stop exercising.  Continue to eat regular, healthy meals.  Wear a good support bra for breast tenderness.  Do not use hot tubs, steam rooms, or saunas.  Wear your seat belt at all times when driving.  Avoid raw meat, uncooked cheese, cat litter boxes, and soil used by cats. These carry germs that can cause birth defects in the baby.  Take your prenatal vitamins.  Take 1500-2000 mg of calcium daily starting at the 20th week of pregnancy until you deliver your baby.  Try taking a stool softener (if your health care provider approves) if you develop constipation. Eat more high-fiber foods, such as fresh vegetables or fruit and whole grains. Drink plenty of fluids to keep your urine clear or pale yellow.  Take warm sitz baths to soothe any pain or discomfort caused by  hemorrhoids. Use hemorrhoid cream if your health care provider approves.  If you develop varicose veins, wear support hose. Elevate your feet for 15 minutes, 3-4 times a day. Limit salt in your diet.  Avoid heavy lifting, wear low heel shoes, and practice good posture.  Rest with your legs elevated if you have leg cramps or low back pain.  Visit your dentist if you have not gone yet during your pregnancy. Use a soft toothbrush to brush your teeth and be gentle when you floss.  A sexual relationship may be continued unless your health care provider directs you otherwise.  Continue to go to all your prenatal visits as directed by your health care provider. SEEK MEDICAL CARE IF:   You have dizziness.  You have mild pelvic cramps, pelvic pressure, or nagging pain in the abdominal area.  You have persistent nausea, vomiting, or diarrhea.  You have a bad smelling vaginal discharge.  You have pain with urination. SEEK IMMEDIATE MEDICAL CARE IF:   You have a fever.  You are leaking fluid from  your vagina.  You have spotting or bleeding from your vagina.  You have severe abdominal cramping or pain.  You have rapid weight gain or loss.  You have shortness of breath with chest pain.  You notice sudden or extreme swelling of your face, hands, ankles, feet, or legs.  You have not felt your baby move in over an hour.  You have severe headaches that do not go away with medicine.  You have vision changes.   This information is not intended to replace advice given to you by your health care provider. Make sure you discuss any questions you have with your health care provider.   Document Released: 12/31/2000 Document Revised: 01/27/2014 Document Reviewed: 03/09/2012 Elsevier Interactive Patient Education [employer].

## 2015-11-15 NOTE — Progress Notes (Signed)
Subjective:  Jamie Lutz is a 20 y.o. 543P2002 Hispanic female at 217w3d by LMP c/w 12wk u/s being seen today for her first obstetrical visit.  Her obstetrical history is significant for prev SVB x 2, PPH d/t deep laceration w/ arterial bleeding repaired in OR w/ 1st baby; late to care; h/o anxiety-on prozac stopped 2mths ago- feels like doing ok.  Pregnancy history fully reviewed.  Patient reports some constipation. Denies vb, cramping, uti s/s, abnormal/malodorous vag d/c, or vulvovaginal itching/irritation. +FM  BP 100/62   Pulse 76   Wt 147 lb (66.7 kg)   LMP 07/23/2015   BMI 25.23 kg/m   HISTORY: OB History  Gravida Para Term Preterm AB Living  3 2 2  0 0 2  SAB TAB Ectopic Multiple Live Births  0 0 0 0 2    # Outcome Date GA Lbr Len/2nd Weight Sex Delivery Anes PTL Lv  3 Current           2 Term 06/11/12 6815w4d 14:17 / 00:05 7 lb 0.6 oz (3.192 kg) M Vag-Spont None N LIV     Birth Comments: Born at 6038 weeks to 20 y.o.  [id number]. No issue or complication around birth or delivery.   1 Term 04/16/11 7813w3d 14:50 / 00:39 5 lb 7.7 oz (2.486 kg) M Vag-Spont Local N LIV     Past Medical History:  Diagnosis Date  . Anxiety   . No pertinent past medical history    Past Surgical History:  Procedure Laterality Date  . NO PAST SURGERIES    . PERINEAL LACERATION REPAIR  04/17/2011   Procedure: SUTURE REPAIR PERINEAL LACERATION;  Surgeon: Tilda BurrowJohn V Ferguson, MD;  Location: [facility];  Service: Gynecology;  Laterality: N/A;   Family History  Problem Relation Age of Onset  . Hypothyroidism Mother   . Diabetes Maternal Grandmother   . Cancer Maternal Grandfather     esophagus    Exam   System:     General: Well developed & nourished, no acute distress   Skin: Warm & dry, normal coloration and turgor, no rashes   Neurologic: Alert & oriented, normal mood   Cardiovascular: Regular rate & rhythm   Respiratory: Effort & rate normal, LCTAB, acyanotic   Abdomen: Soft, non tender   Extremities: normal strength, tone   Pelvic Exam:    Perineum: Normal perineum   Vulva: Normal, no lesions   Vagina:  Normal mucosa, normal discharge   Cervix: Normal, bulbous, appears closed   Uterus: Normal size/shape/contour for GA   Thin prep pap smear obtained w/ reflex high risk HPV cotesting FHR: 152 via doppler   Assessment:   Pregnancy: [name] Patient Active Problem List   Diagnosis Date Noted  . Supervision of normal pregnancy 11/15/2015  . Chest pain 08/28/2015  . Cephalalgia 06/09/2015  . Adjustment disorder with anxious mood 11/03/2014  . 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 Chronic abdominal pain 05/03/2014  . Constipation 05/03/2014  . Paresthesia 02/06/2014    707w3d G3P2002 New OB visit H/O PPH d/t laceration Late care  Plan:  Initial labs drawn Continue prenatal vitamins Problem list reviewed and updated Reviewed n/v relief measures and warning s/s to report Reviewed recommended weight gain based on pre-gravid BMI Encouraged well-balanced diet Genetic Screening discussed Integrated Screen: requested, has already had 1st it/nt, getting 2nd IT today Cystic fibrosis screening discussed declined Ultrasound discussed; fetal survey: requested Follow up in 3 weeks for visit and anatomy u/s CCNC completed Flu shot today  Marge Duncans CNM, [facility] 11/15/2015 3:39 PM

## 2015-11-16 LAB — PMP SCREEN PROFILE (10S), URINE
Amphetamine Screen, Ur: NEGATIVE ng/mL
BARBITURATE SCRN UR: NEGATIVE ng/mL
Benzodiazepine Screen, Urine: NEGATIVE ng/mL
COCAINE(METAB.) SCREEN, URINE: NEGATIVE ng/mL
CREATININE(CRT), U: 58.8 mg/dL (ref 20.0–300.0)
Cannabinoids Ur Ql Scn: NEGATIVE ng/mL
METHADONE SCREEN, URINE: NEGATIVE ng/mL
Opiate Scrn, Ur: NEGATIVE ng/mL
Oxycodone+Oxymorphone Ur Ql Scn: NEGATIVE ng/mL
PCP Scrn, Ur: NEGATIVE ng/mL
PROPOXYPHENE SCREEN: NEGATIVE ng/mL
Ph of Urine: 6.8 (ref 4.5–8.9)

## 2015-11-16 LAB — RUBELLA SCREEN: RUBELLA: 1.22 {index} (ref >0.99)

## 2015-11-16 LAB — CBC
HEMOGLOBIN: 13 g/dL (ref 11.1–15.9)
Hematocrit: 37.6 % (ref 34.0–46.6)
MCH: 29.2 pg (ref 26.6–33.0)
MCHC: 34.6 g/dL (ref 31.5–35.7)
MCV: 85 fL (ref 79–97)
Platelets: 206 10*3/uL (ref 150–379)
RBC: 4.45 x10E6/uL (ref 3.77–5.28)
RDW: 13.4 % (ref 12.3–15.4)
WBC: 11.9 10*3/uL — AB (ref 3.4–10.8)

## 2015-11-16 LAB — URINALYSIS, ROUTINE W REFLEX MICROSCOPIC
Bilirubin, UA: NEGATIVE
GLUCOSE, UA: NEGATIVE
KETONES UA: NEGATIVE
Leukocytes, UA: NEGATIVE
NITRITE UA: NEGATIVE
Protein, UA: NEGATIVE
RBC, UA: NEGATIVE
SPEC GRAV UA: 1.014 (ref 1.005–1.030)
UUROB: 0.2 mg/dL (ref 0.2–1.0)
pH, UA: 7 (ref 5.0–7.5)

## 2015-11-16 LAB — ABO/RH: Rh Factor: POSITIVE

## 2015-11-16 LAB — SICKLE CELL SCREEN: Sickle Cell Screen: NEGATIVE

## 2015-11-16 LAB — HEPATITIS B SURFACE ANTIGEN: HEP B S AG: NEGATIVE

## 2015-11-16 LAB — ANTIBODY SCREEN: ANTIBODY SCREEN: NEGATIVE

## 2015-11-16 LAB — RPR: RPR Ser Ql: NONREACTIVE

## 2015-11-16 LAB — HIV ANTIBODY (ROUTINE TESTING W REFLEX): HIV SCREEN 4TH GENERATION: NONREACTIVE

## 2015-11-16 LAB — VARICELLA ZOSTER ANTIBODY, IGG: VARICELLA: 530 {index} (ref >165)

## 2015-11-17 LAB — MATERNAL SCREEN, INTEGRATED #2
AFP MoM: 1.31
Alpha-Fetoprotein: 43 ng/mL
Crown Rump Length: 58.1 mm
DIA MOM: 2.74
DIA VALUE: 490.1 pg/mL
ESTRIOL UNCONJUGATED: 0.58 ng/mL
Gest. Age on Collection Date: 12.3 weeks
Gestational Age: 16.3 weeks
HCG MOM: 2.12
Maternal Age at EDD: 22.4 years
NUCHAL TRANSLUCENCY (NT): 1.1 mm
NUCHAL TRANSLUCENCY MOM: 0.73
Number of Fetuses: 1
PAPP-A MOM: 0.3
PAPP-A Value: 278 ng/mL
Test Results:: NEGATIVE
Weight: 145 [lb_av]
Weight: 145 [lb_av]
hCG Value: 70.1 IU/mL
uE3 MoM: 0.67

## 2015-11-17 LAB — URINE CULTURE: ORGANISM ID, BACTERIA: NO GROWTH

## 2015-11-19 LAB — CYTOLOGY - PAP
Chlamydia: NEGATIVE
DIAGNOSIS: NEGATIVE
Neisseria Gonorrhea: NEGATIVE

## 2015-12-05 ENCOUNTER — Ambulatory Visit (INDEPENDENT_AMBULATORY_CARE_PROVIDER_SITE_OTHER): Payer: Self-pay | Admitting: Obstetrics and Gynecology

## 2015-12-05 ENCOUNTER — Encounter: Payer: Self-pay | Admitting: Obstetrics and Gynecology

## 2015-12-05 ENCOUNTER — Ambulatory Visit (INDEPENDENT_AMBULATORY_CARE_PROVIDER_SITE_OTHER): Payer: Self-pay

## 2015-12-05 VITALS — BP 110/70 | HR 78 | Wt 149.0 lb

## 2015-12-05 DIAGNOSIS — Z3482 Encounter for supervision of other normal pregnancy, second trimester: Secondary | ICD-10-CM

## 2015-12-05 DIAGNOSIS — Z1389 Encounter for screening for other disorder: Secondary | ICD-10-CM

## 2015-12-05 DIAGNOSIS — Z3A20 20 weeks gestation of pregnancy: Secondary | ICD-10-CM

## 2015-12-05 DIAGNOSIS — O321XX1 Maternal care for breech presentation, fetus 1: Secondary | ICD-10-CM

## 2015-12-05 DIAGNOSIS — Z331 Pregnant state, incidental: Secondary | ICD-10-CM

## 2015-12-05 DIAGNOSIS — Z363 Encounter for antenatal screening for malformations: Secondary | ICD-10-CM

## 2015-12-05 DIAGNOSIS — Z3402 Encounter for supervision of normal first pregnancy, second trimester: Secondary | ICD-10-CM

## 2015-12-05 LAB — POCT URINALYSIS DIPSTICK
Blood, UA: NEGATIVE
Glucose, UA: NEGATIVE
KETONES UA: NEGATIVE
LEUKOCYTES UA: NEGATIVE
NITRITE UA: NEGATIVE

## 2015-12-05 NOTE — Progress Notes (Signed)
Patient ID: Jamie Lutz, female   DOB: 02/15/1995, 20 y.o.   MRN: [id number]  [id number]  Estimated Date of Delivery: 04/28/16 LROB 8385w2d  Blood pressure 110/70, pulse 78, weight 149 lb (67.6 kg), last menstrual period 07/23/2015, unknown if currently breastfeeding.    Urine results: notable for trace protein   Chief Complaint  Patient presents with  . Routine Prenatal Visit    ultrasound    Patient complaints: none . She states she has not decided on birth control method after delivery.   Patient reports too early for fetal movement. She denies any bleeding, rupture of membranes, or regular contractions.   Refer to the ob flow sheet for FH and FHR.    Physical Examination: General appearance - alert, well appearing, and in no distress                                      Abdomen -FHR 150 bpm                                                         soft, nontender, nondistended, no masses or organomegaly                                            Questions were answered. Assessment: LROB G3P2002 @ 3685w2d  Normal anatomy survey  Plan:  Continued routine obstetrical care  F/u in 4 weeks for routine prenatal care   By signing my name below, I, Doreatha MartinEva Mathews, attest that this documentation has been prepared under the direction and in the presence of Tilda BurrowJohn V Edric Fetterman, MD. Electronically Signed: Doreatha MartinEva Mathews, ED Scribe. 12/05/15. 11:17 AM. I personally performed the services described in this documentation, which was SCRIBED in my presence. The recorded information has been reviewed and considered accurate. It has been edited as necessary during review. Tilda BurrowFERGUSON,Aysel Gilchrest V, MD

## 2015-12-05 NOTE — Progress Notes (Signed)
US 19+2 wks,breech,cx 4 cm,normal ov's bilat,post pl gr 0,svp of fluid 3.3 cm,fhr 150 bpm,efw 292 g,anatomy complete, no obvious abnormalities seen

## 2016-01-02 ENCOUNTER — Encounter: Payer: Self-pay | Admitting: Women's Health

## 2016-01-21 NOTE — L&D Delivery Note (Signed)
Patient is a 21 yo G3 now P3 admitted for labor. No augmentation required.  Delivery Note At 5:32 PM a viable female was delivered via Vaginal, Spontaneous Delivery (Presentation: LOA;  ).  APGAR: 8, 9; weight  .   Placenta status: Delivered intact with gentle traction .  Cord: 3 vessels with the following complications: nuchal x1 .  Cord pH: not ordered  Anesthesia: No epidural  Episiotomy: None Lacerations: 1st degree;Perineal Suture Repair: none required Est. Blood Loss (mL): 150  Mom to postpartum.  Baby to Couplet care / Skin to Skin.  Jamie Lutz 04/23/2016, 5:44 PM  OB FELLOW DELIVERY ATTESTATION  I was gloved and present for the delivery in its entirety, and I agree with the above resident's note.    Ernestina Penna, MD 6:31 PM

## 2016-01-24 ENCOUNTER — Ambulatory Visit (INDEPENDENT_AMBULATORY_CARE_PROVIDER_SITE_OTHER): Payer: Self-pay | Admitting: Women's Health

## 2016-01-24 ENCOUNTER — Encounter: Payer: Self-pay | Admitting: Women's Health

## 2016-01-24 VITALS — BP 116/61 | HR 88 | Wt 163.0 lb

## 2016-01-24 DIAGNOSIS — Z3482 Encounter for supervision of other normal pregnancy, second trimester: Secondary | ICD-10-CM

## 2016-01-24 DIAGNOSIS — Z3A26 26 weeks gestation of pregnancy: Secondary | ICD-10-CM

## 2016-01-24 DIAGNOSIS — Z331 Pregnant state, incidental: Secondary | ICD-10-CM

## 2016-01-24 DIAGNOSIS — Z1389 Encounter for screening for other disorder: Secondary | ICD-10-CM

## 2016-01-24 LAB — POCT URINALYSIS DIPSTICK
GLUCOSE UA: NEGATIVE
Ketones, UA: NEGATIVE
Leukocytes, UA: NEGATIVE
Nitrite, UA: NEGATIVE
Protein, UA: NEGATIVE

## 2016-01-24 NOTE — Progress Notes (Signed)
Low-risk OB appointment [id number] 5615w3d Estimated Date of Delivery: 04/28/16 BP 116/61   Pulse 88   Wt 163 lb (73.9 kg)   LMP 07/23/2015   BMI 27.98 kg/m   BP, weight, and urine reviewed.  Refer to obstetrical flow sheet for FH & FHR.  Reports good fm.  Denies regular uc's, lof, vb, or uti s/s. Had to reschedule last appt, this was earliest she could come. Some round ligament pain. Felt like heart was racing last night and skipping beats, 1st time it has happened, did drink Coke yesterday for 1st time in a long time.  HRRR Reviewed ptl s/s, fm. Decrease/eliminate caffeine Plan:  Continue routine obstetrical care  F/U in 2wks for OB appointment and pn2

## 2016-01-24 NOTE — Patient Instructions (Signed)
You will have your sugar test next visit.  Please do not eat or drink anything after midnight the night before you come, not even water.  You will be here for at least two hours.     Call the office ([phone]) or go to [facility] if:  You begin to have strong, frequent contractions  Your water breaks.  Sometimes it is a big gush of fluid, sometimes it is just a trickle that keeps getting your panties wet or running down your legs  You have vaginal bleeding.  It is normal to have a small amount of spotting if your cervix was checked.   You don't feel your baby moving like normal.  If you don't, get you something to eat and drink and lay down and focus on feeling your baby move.   If your baby is still not moving like normal, you should call the office or go to [facility].  Second Trimester of Pregnancy The second trimester is from week 13 through week 28, months 4 through 6. The second trimester is often a time when you feel your best. Your body has also adjusted to being pregnant, and you begin to feel better physically. Usually, morning sickness has lessened or quit completely, you may have more energy, and you may have an increase in appetite. The second trimester is also a time when the fetus is growing rapidly. At the end of the sixth month, the fetus is about 9 inches long and weighs about 1 pounds. You will likely begin to feel the baby move (quickening) between 18 and 20 weeks of the pregnancy. BODY CHANGES Your body goes through many changes during pregnancy. The changes vary from woman to woman.   Your weight will continue to increase. You will notice your lower abdomen bulging out.  You may begin to get stretch marks on your hips, abdomen, and breasts.  You may develop headaches that can be relieved by medicines approved by your health care provider.  You may urinate more often because the fetus is pressing on your bladder.  You may develop or continue to have  heartburn as a result of your pregnancy.  You may develop constipation because certain hormones are causing the muscles that push waste through your intestines to slow down.  You may develop hemorrhoids or swollen, bulging veins (varicose veins).  You may have back pain because of the weight gain and pregnancy hormones relaxing your joints between the bones in your pelvis and as a result of a shift in weight and the muscles that support your balance.  Your breasts will continue to grow and be tender.  Your gums may bleed and may be sensitive to brushing and flossing.  Dark spots or blotches (chloasma, mask of pregnancy) may develop on your face. This will likely fade after the baby is born.  A dark line from your belly button to the pubic area (linea nigra) may appear. This will likely fade after the baby is born.  You may have changes in your hair. These can include thickening of your hair, rapid growth, and changes in texture. Some women also have hair loss during or after pregnancy, or hair that feels dry or thin. Your hair will most likely return to normal after your baby is born. WHAT TO EXPECT AT YOUR PRENATAL VISITS During a routine prenatal visit:  You will be weighed to make sure you and the fetus are growing normally.  Your blood pressure will be taken.    Your abdomen will be measured to track your baby's growth.  The fetal heartbeat will be listened to.  Any test results from the previous visit will be discussed. Your health care provider may ask you:  How you are feeling.  If you are feeling the baby move.  If you have had any abnormal symptoms, such as leaking fluid, bleeding, severe headaches, or abdominal cramping.  If you have any questions. Other tests that may be performed during your second trimester include:  Blood tests that check for:  Low iron levels (anemia).  Gestational diabetes (between 24 and 28 weeks).  Rh antibodies.  Urine tests to check  for infections, diabetes, or protein in the urine.  An ultrasound to confirm the proper growth and development of the baby.  An amniocentesis to check for possible genetic problems.  Fetal screens for spina bifida and Down syndrome. HOME CARE INSTRUCTIONS   Avoid all smoking, herbs, alcohol, and unprescribed drugs. These chemicals affect the formation and growth of the baby.  Follow your health care provider's instructions regarding medicine use. There are medicines that are either safe or unsafe to take during pregnancy.  Exercise only as directed by your health care provider. Experiencing uterine cramps is a good sign to stop exercising.  Continue to eat regular, healthy meals.  Wear a good support bra for breast tenderness.  Do not use hot tubs, steam rooms, or saunas.  Wear your seat belt at all times when driving.  Avoid raw meat, uncooked cheese, cat litter boxes, and soil used by cats. These carry germs that can cause birth defects in the baby.  Take your prenatal vitamins.  Try taking a stool softener (if your health care provider approves) if you develop constipation. Eat more high-fiber foods, such as fresh vegetables or fruit and whole grains. Drink plenty of fluids to keep your urine clear or pale yellow.  Take warm sitz baths to soothe any pain or discomfort caused by hemorrhoids. Use hemorrhoid cream if your health care provider approves.  If you develop varicose veins, wear support hose. Elevate your feet for 15 minutes, 3-4 times a day. Limit salt in your diet.  Avoid heavy lifting, wear low heel shoes, and practice good posture.  Rest with your legs elevated if you have leg cramps or low back pain.  Visit your dentist if you have not gone yet during your pregnancy. Use a soft toothbrush to brush your teeth and be gentle when you floss.  A sexual relationship may be continued unless your health care provider directs you otherwise.  Continue to go to all your  prenatal visits as directed by your health care provider. SEEK MEDICAL CARE IF:   You have dizziness.  You have mild pelvic cramps, pelvic pressure, or nagging pain in the abdominal area.  You have persistent nausea, vomiting, or diarrhea.  You have a bad smelling vaginal discharge.  You have pain with urination. SEEK IMMEDIATE MEDICAL CARE IF:   You have a fever.  You are leaking fluid from your vagina.  You have spotting or bleeding from your vagina.  You have severe abdominal cramping or pain.  You have rapid weight gain or loss.  You have shortness of breath with chest pain.  You notice sudden or extreme swelling of your face, hands, ankles, feet, or legs.  You have not felt your baby move in over an hour.  You have severe headaches that do not go away with medicine.  You have vision changes.   Document Released: 12/31/2000 Document Revised: 01/11/2013 Document Reviewed: 03/09/2012 [employer] Patient Information 2015 [employer]. This information is not intended to replace advice given to you by your health care provider. Make sure you discuss any questions you have with your health care provider.     

## 2016-01-31 ENCOUNTER — Telehealth: Payer: Self-pay | Admitting: Women's Health

## 2016-01-31 ENCOUNTER — Other Ambulatory Visit: Payer: Self-pay | Admitting: Women's Health

## 2016-01-31 MED ORDER — NITROFURANTOIN MONOHYD MACRO 100 MG PO CAPS: 100.0000 mg | ORAL_CAPSULE | Freq: Two times a day (BID) | ORAL | 0 refills | Status: DC

## 2016-01-31 NOTE — Progress Notes (Unsigned)
Pt sent mychart message, urinary frequency/urgency. To come give urine for cx, rx macrobid (to start after gives sample).  Cheral MarkerKimberly R. Booker, CNM, [facility] 01/31/2016 2:05 PM

## 2016-01-31 NOTE — Telephone Encounter (Signed)
Pt states she is having a lot of urinary frequency, no pain with urination and no blood in urine.  Pt states she was having this problem some before she was pregnant too but at that time she was on Prozac and it helped with her anxiety.  She states she is having a lot of anxiety about not being able to make it to the bathroom on time and is wanting to know if there is anything she can do.  When she was here last week her urine was normal and she has a follow up OB appointment with Dr. Despina HiddenEure on 1/18 but wants to know if something can be done for her before then.  Please advise

## 2016-02-04 ENCOUNTER — Other Ambulatory Visit: Payer: Self-pay | Admitting: *Deleted

## 2016-02-04 ENCOUNTER — Other Ambulatory Visit: Payer: Self-pay

## 2016-02-04 DIAGNOSIS — Z3483 Encounter for supervision of other normal pregnancy, third trimester: Secondary | ICD-10-CM

## 2016-02-04 NOTE — Telephone Encounter (Signed)
Pt informed to come by here and leave a urine specimen for culture and then go by her pharmacy and pick up ABX that Selena BattenKim RX'd last week.  Pt verbalized understanding.

## 2016-02-06 LAB — URINE CULTURE

## 2016-02-07 ENCOUNTER — Encounter: Payer: Self-pay | Admitting: Obstetrics & Gynecology

## 2016-02-07 ENCOUNTER — Other Ambulatory Visit: Payer: Self-pay

## 2016-02-15 ENCOUNTER — Encounter: Payer: Self-pay | Admitting: Women's Health

## 2016-02-18 ENCOUNTER — Other Ambulatory Visit: Payer: Self-pay

## 2016-02-18 ENCOUNTER — Ambulatory Visit (INDEPENDENT_AMBULATORY_CARE_PROVIDER_SITE_OTHER): Payer: Self-pay | Admitting: Obstetrics & Gynecology

## 2016-02-18 ENCOUNTER — Encounter: Payer: Self-pay | Admitting: Obstetrics & Gynecology

## 2016-02-18 VITALS — BP 110/70 | HR 88 | Wt 167.0 lb

## 2016-02-18 DIAGNOSIS — Z1389 Encounter for screening for other disorder: Secondary | ICD-10-CM

## 2016-02-18 DIAGNOSIS — Z3483 Encounter for supervision of other normal pregnancy, third trimester: Secondary | ICD-10-CM

## 2016-02-18 DIAGNOSIS — Z3A30 30 weeks gestation of pregnancy: Secondary | ICD-10-CM

## 2016-02-18 DIAGNOSIS — Z331 Pregnant state, incidental: Secondary | ICD-10-CM

## 2016-02-18 DIAGNOSIS — Z131 Encounter for screening for diabetes mellitus: Secondary | ICD-10-CM

## 2016-02-18 DIAGNOSIS — Z3482 Encounter for supervision of other normal pregnancy, second trimester: Secondary | ICD-10-CM

## 2016-02-18 LAB — POCT URINALYSIS DIPSTICK
Blood, UA: NEGATIVE
Glucose, UA: NEGATIVE
Ketones, UA: NEGATIVE
Leukocytes, UA: NEGATIVE
Nitrite, UA: NEGATIVE
PROTEIN UA: NEGATIVE

## 2016-02-18 NOTE — Progress Notes (Signed)
[id number] 3367w0d Estimated Date of Delivery: 04/28/16  Blood pressure 110/70, pulse 88, weight 167 lb (75.8 kg), last menstrual period 07/23/2015, unknown if currently breastfeeding.   BP weight and urine results all reviewed and noted.  Please refer to the obstetrical flow sheet for the fundal height and fetal heart rate documentation:  Patient reports good fetal movement, denies any bleeding and no rupture of membranes symptoms or regular contractions. Patient is without complaints. All questions were answered.  Orders Placed This Encounter  Procedures  . POCT urinalysis dipstick    Plan:  Continued routine obstetrical care, PN2 today  Return in about 3 weeks (around 03/10/2016) for LROB.

## 2016-02-19 LAB — GLUCOSE TOLERANCE, 2 HOURS W/ 1HR
GLUCOSE, 2 HOUR: 109 mg/dL (ref 65–152)
Glucose, 1 hour: 180 mg/dL — ABNORMAL HIGH (ref 65–179)
Glucose, Fasting: 84 mg/dL (ref 65–91)

## 2016-02-19 LAB — HIV ANTIBODY (ROUTINE TESTING W REFLEX): HIV SCREEN 4TH GENERATION: NONREACTIVE

## 2016-02-19 LAB — ANTIBODY SCREEN: Antibody Screen: NEGATIVE

## 2016-02-19 LAB — CBC
HEMOGLOBIN: 12.3 g/dL (ref 11.1–15.9)
Hematocrit: 38 % (ref 34.0–46.6)
MCH: 28.1 pg (ref 26.6–33.0)
MCHC: 32.4 g/dL (ref 31.5–35.7)
MCV: 87 fL (ref 79–97)
Platelets: 199 10*3/uL (ref 150–379)
RBC: 4.38 x10E6/uL (ref 3.77–5.28)
RDW: 14.7 % (ref 12.3–15.4)
WBC: 13.1 10*3/uL — ABNORMAL HIGH (ref 3.4–10.8)

## 2016-02-19 LAB — RPR: RPR: NONREACTIVE

## 2016-02-20 ENCOUNTER — Telehealth: Payer: Self-pay | Admitting: Advanced Practice Midwife

## 2016-02-20 NOTE — Telephone Encounter (Signed)
Pt states she has a very sore throat, congestion and cough.  Pt informed to use Tylenol, Mucinex, sudafed or Robitussin as needed, also instructed she can use a saline nasal spray and throat lozenges if wants and push fluids.  Pt reports good FM, no cramping or bleeding and no loss of fluids.  Pt instructed to call us back if no improvement or symptoms worsen.  Pt verbalized understanding.

## 2016-02-25 ENCOUNTER — Telehealth: Payer: Self-pay | Admitting: *Deleted

## 2016-02-25 ENCOUNTER — Other Ambulatory Visit: Payer: Self-pay | Admitting: Women's Health

## 2016-02-25 DIAGNOSIS — Z8632 Personal history of gestational diabetes: Secondary | ICD-10-CM | POA: Insufficient documentation

## 2016-02-25 DIAGNOSIS — O24410 Gestational diabetes mellitus in pregnancy, diet controlled: Secondary | ICD-10-CM

## 2016-02-25 NOTE — Telephone Encounter (Signed)
Informed pt of GDM and need for nutrition visit and gave pt their phone # to call and inquire how much the visit with them will be, pt is self-pay.

## 2016-03-10 ENCOUNTER — Encounter: Payer: Self-pay | Admitting: Women's Health

## 2016-03-10 ENCOUNTER — Ambulatory Visit (INDEPENDENT_AMBULATORY_CARE_PROVIDER_SITE_OTHER): Payer: Self-pay | Admitting: Women's Health

## 2016-03-10 VITALS — BP 104/60 | HR 76 | Wt 171.0 lb

## 2016-03-10 DIAGNOSIS — O0993 Supervision of high risk pregnancy, unspecified, third trimester: Secondary | ICD-10-CM

## 2016-03-10 DIAGNOSIS — Z1389 Encounter for screening for other disorder: Secondary | ICD-10-CM

## 2016-03-10 DIAGNOSIS — O24410 Gestational diabetes mellitus in pregnancy, diet controlled: Secondary | ICD-10-CM

## 2016-03-10 DIAGNOSIS — Z3A33 33 weeks gestation of pregnancy: Secondary | ICD-10-CM

## 2016-03-10 DIAGNOSIS — Z331 Pregnant state, incidental: Secondary | ICD-10-CM

## 2016-03-10 LAB — POCT URINALYSIS DIPSTICK
Glucose, UA: NEGATIVE
KETONES UA: NEGATIVE
Nitrite, UA: NEGATIVE
PROTEIN UA: NEGATIVE
RBC UA: NEGATIVE

## 2016-03-10 NOTE — Progress Notes (Signed)
High Risk Pregnancy Diagnosis(es): A1DM G3P2002 1669w0d Estimated Date of Delivery: 04/28/16 BP 104/60   Pulse 76   Wt 171 lb (77.6 kg)   LMP 07/23/2015   BMI 29.35 kg/m   Urinalysis: Positive for tr leuks HPI:  Goes to dietician 2/26. Reports h/o depression, was on prozac prior to pregnancy, feels she needs to restart. Denies SI/HI. Discussed it is ok to restart now.  BP, weight, and urine reviewed.  Reports good fm. Denies regular uc's, lof, vb, uti s/s. No complaints.  Fundal Height:  31 Fetal Heart rate:  140 Edema:  none  Reviewed ptl s/s, fkc. To decrease carbs/increase protein All questions were answered Assessment: 5669w0d A1DM Medication(s) Plans:  None for now Treatment Plan:  Growth u/s @ 38wks     Deliver @ 40wks  Follow up in 2wks for high-risk OB appt, sees dietician in 1wk To bring log to each appt

## 2016-03-10 NOTE — Patient Instructions (Addendum)
Check blood sugars 4 times a day: in the morning before eating/drinking anything (<90) and 2 hours after eating breakfast, lunch, and supper (<120).    Call the office [phone]) or go to [facility] if:  You begin to have strong, frequent contractions  Your water breaks.  Sometimes it is a big gush of fluid, sometimes it is just a trickle that keeps getting your panties wet or running down your legs  You have vaginal bleeding.  It is normal to have a small amount of spotting if your cervix was checked.   You don't feel your baby moving like normal.  If you don't, get you something to eat and drink and lay down and focus on feeling your baby move.  You should feel at least 10 movements in 2 hours.  If you don't, you should call the office or go to [facility].    For Headaches:   Stay well hydrated, drink enough water so that your urine is clear, sometimes if you are dehydrated you can get headaches  Eat small frequent meals and snacks, sometimes if you are hungry you can get headaches  Sometimes you get headaches during pregnancy from the pregnancy hormones  You can try tylenol (1-2 regular strength 356m or 1-2 extra strength 5040m as directed on the box. The least amount of medication that works is best.   Cool compresses (cool wet washcloth or ice pack) to area of head that is hurting  You can also try drinking a caffeinated drink to see if this will help  If not helping, try below:  For Prevention of Headaches/Migraines:  CoQ10 10062mhree times daily  Vitamin B2 400m49mily  Magnesium Oxide 400-600mg18mly  If You Get a Bad Headache/Migraine:  Benadryl 25mg 24mgnesium Oxide  1 large Gatorade  2 extra strength Tylenol (1,000mg t65m)  1 cup coffee or Coke  If this doesn't help please call us @ [phone] Diabetes Mellitus, Self Care Caring for yourself after you have been diagnosed with gestational diabetes (gestational diabetes  mellitus) means keeping your blood sugar (glucose) under control with a balance of:  Nutrition.  Exercise.  Lifestyle changes.  Medicines or insulin, if necessary.  Support from your team of health care providers and others. The following information explains what you need to know to manage your gestational diabetes at home. What do I need to do to manage my blood glucose?  Check your blood glucose every day during your pregnancy. Do this as often as told by your health care provider.  Contact your health care provider if your blood glucose is above your target for 2 tests in a row. Your health care provider will set individualized treatment goals for you. Generally, the goal of treatment is to maintain the following blood glucose levels during pregnancy:  After not eating for 8 hours (after fasting): at or below 95 mg/dL (5.3 mmol/L).  After meals (postprandial):  One hour after a meal: at or below 140 mg/dL (7.8 mmol/L).  Two hours after a meal: at or below 120 mg/dL (6.7 mmol/L).  A1c (hemoglobin A1c) level: 6-6.5%. What do I need to know about hyperglycemia and hypoglycemia? What is hyperglycemia?  Hyperglycemia, also called high blood glucose, occurs when blood glucose is too high. Make sure you know the early signs of hyperglycemia, such as:  Increased thirst.  Hunger.  Feeling very tired.  Needing to urinate more often than usual.  Blurry vision. What is hypoglycemia?  Hypoglycemia, also called low blood glucose, occurswith a blood glucose level at or below 70 mg/dL (3.9 mmol/L). The risk for hypoglycemia increases during or after exercise, during sleep, during illness, and when skipping meals or not eating for a long time (fasting). It is important to know the symptoms of hypoglycemia and treat it right away. Always have a 15-gram rapid-acting carbohydrate snack with you to treat low blood glucose.Family members and close friends should also know the symptoms and  should understand how to treat hypoglycemia, in case you are not able to treat yourself. What are the symptoms of hypoglycemia?  Hypoglycemia symptoms can include:  Hunger.  Anxiety.  Sweating and feeling clammy.  Confusion.  Dizziness or feeling light-headed.  Sleepiness.  Nausea.  Increased heart rate.  Headache.  Blurry vision.  Seizure.  Nightmares.  Tingling or numbness around the mouth, lips, or tongue.  A change in speech.  Decreased ability to concentrate.  A change in coordination.  Restless sleep.  Tremors or shakes.  Fainting.  Irritability. How do I treat hypoglycemia?  If you are alert and able to swallow safely, follow the 15:15 rule:  Take 15 grams of a rapid-acting carbohydrate. Rapid-acting options include:  1 tube of glucose gel.  3 glucose pills.  6-8 pieces of hard candy.  4 oz (120 mL) of fruit juice.  4 oz (120 mL) of regular (not diet) soda.  Check your blood glucose 15 minutes after you take the carbohydrate.  If the repeat blood glucose level is still at or below 70 mg/dL (3.9 mmol/L), take 15 grams of a carbohydrate again.  If your blood glucose level does not increase above 70 mg/dL (3.9 mmol/L) after 3 tries, seek emergency medical care.  After your blood glucose level returns to normal, eat a meal or a snack within 1 hour. How do I treat severe hypoglycemia?  Severe hypoglycemia is when your blood glucose level is at or below 54 mg/dL (3 mmol/L). Severe hypoglycemia is an emergency. Do not wait to see if the symptoms will go away. Get medical help right away. Call your local emergency services (911 in the [place] Do not drive yourself to the hospital. If you have severe hypoglycemia and you cannot eat or drink, you may need an injection of glucagon. A family member or close friend should learn how to check your blood glucose and how to give you a glucagon injection. Ask your health care provider if you need to have an  emergency glucagon injection kit available. Severe hypoglycemia may need to be treated in a hospital. The treatment may include getting glucose through an IV tube. You may also need treatment for the cause of your hypoglycemia. What else can I do to manage my gestational diabetes? Take your diabetes medicines as told  If your health care provider prescribed insulin or diabetes medicines, take them every day.  Do not run out of insulin or other diabetes medicines that you take. Plan ahead so you always have these available.  If you use insulin, adjust your dosage based on how physically active you are and what foods you eat. Your health care provider will tell you how to adjust your dosage. Make healthy food choices  The things that you eat and drink affect your blood glucose. Making good choices helps to control your diabetes and prevent other health problems. A healthy meal plan includes eating lean proteins, complex carbohydrates, fresh fruits and vegetables, low-fat dairy products, and healthy fats. Make an appointment  to see a diet and nutrition specialist (registered dietitian) to help you create an eating plan that is right for you. Make sure that you:  Follow instructions from your health care provider about eating or drinking restrictions.  Drink enough fluid to keep your urine clear or pale yellow.  Eat healthy snacks between nutritious meals.  Track the carbohydrates that you eat. Do this by reading food labels and learning the standard serving sizes of foods.  Follow your sick day plan whenever you cannot eat or drink as usual. Make this plan in advance with your health care provider. Stay active   Do at least 30 minutes of physical activity a day, or as much physical activity as your health care provider recommends during your pregnancy.  Doing 10 minutes of exercise 30 minutes after each meal may help to control postprandial blood glucose levels.  If you start a new exercise  or activity, work with your health care provider to adjust your insulin, medicines, or food intake as needed. Make healthy lifestyle choices  Do not drink alcohol.  Do not use any tobacco products, such as cigarettes, chewing tobacco, and e-cigarettes. If you need help quitting, ask your health care provider.  Learn to manage stress. If you need help with this, ask your health care provider. Care for your body  Keep your immunizations up to date.  Brush your teeth and gums two times a day, and floss at least one time a day.  Visit your dentist at least once every 6 months.  Maintain a healthy weight during your pregnancy. General instructions   Take over-the-counter and prescription medicines only as told by your health care provider.  Talk with your health care provider about your risk for high blood pressure during pregnancy (preeclampsia or eclampsia).  Share your diabetes management plan with people in your workplace, school, and household.  Check your urine for ketones during your pregnancy when you are ill and as told by your health care provider.  Carry a medical alert card or wear medical alert jewelry.  Ask your health care provider:  Do I need to meet with a diabetes educator?  Where can I find a support group for people with diabetes?  Keep all follow-up visits during your pregnancy (prenatal) and after delivery (postnatal) as told by your health care provider. This is important. Get the care that you need after delivery  Have your blood glucose level checked 4-12 weeks after delivery. This is done with an oral glucose tolerance test (OGTT).  Get screened for diabetes at least every 3 years, or as often as told by your health care provider. Where to find more information: To learn more about gestational diabetes, visit:  American Diabetes Association (ADA): www.diabetes.org/diabetes-basics/gestational  Centers for Disease Control and Prevention (CDC):  http://sanchez-watson.com/.pdf This information is not intended to replace advice given to you by your health care provider. Make sure you discuss any questions you have with your health care provider. Document Released: 04/30/2015 Document Revised: 06/14/2015 Document Reviewed: 02/09/2015 Elsevier Interactive Patient Education  2017 [employer].

## 2016-03-17 ENCOUNTER — Encounter: Payer: Self-pay | Attending: Obstetrics and Gynecology

## 2016-03-17 NOTE — Progress Notes (Deleted)
Diabetes Self-Management Education  Visit Type: First/Initial  Appt. Start Time: *** Appt. End Time: ***  03/17/2016  Jamie Lutz, identified by name and date of birth, is a 21 y.o. female with a diagnosis of Diabetes: Gestational Diabetes.   ASSESSMENT  Height 5\' 4"  (1.626 m), weight 175 lb 8 oz (79.6 kg), last menstrual period 07/23/2015, unknown if currently breastfeeding. Body mass index is 30.12 kg/m.      Diabetes Self-Management Education - 03/17/16 1151      Visit Information   Visit Type First/Initial     Initial Visit   Diabetes Type Gestational Diabetes   Are you currently following a meal plan? No   Are you taking your medications as prescribed? Not on Medications   Date Diagnosed 08/17/16     Health Coping   How would you rate your overall health? Excellent;Fair     Psychosocial Assessment   Patient Belief/Attitude about Diabetes Motivated to manage diabetes     Pre-Education Assessment   Patient understands the diabetes disease and treatment process. Needs Instruction   Patient understands incorporating nutritional management into lifestyle. Needs Instruction   Patient undertands incorporating physical activity into lifestyle. Needs Instruction   Patient understands using medications safely. Needs Instruction   Patient understands monitoring blood glucose, interpreting and using results Needs Instruction   Patient understands prevention, detection, and treatment of acute complications. Needs Instruction   Patient understands prevention, detection, and treatment of chronic complications. Needs Instruction   Patient understands how to develop strategies to address psychosocial issues. Needs Instruction   Patient understands how to develop strategies to promote health/change behavior. Needs Instruction     Complications   Have you had a dilated eye exam in the past 12 months? Yes   Have you had a dental exam in the past 12 months? No   Are you  checking your feet? Yes   How many days per week are you checking your feet? 3     Exercise   Exercise Type Light (walking / raking leaves)   How many days per week to you exercise? 3   How many minutes per day do you exercise? 25   Total minutes per week of exercise 75     Patient Education   Previous Diabetes Education No     Post-Education Assessment   Patient understands the diabetes disease and treatment process. Demonstrates understanding / competency   Patient understands incorporating nutritional management into lifestyle. Demonstrates understanding / competency   Patient undertands incorporating physical activity into lifestyle. Demonstrates understanding / competency   Patient understands using medications safely. Demonstrates understanding / competency   Patient understands monitoring blood glucose, interpreting and using results Demonstrates understanding / competency   Patient understands prevention, detection, and treatment of acute complications. Demonstrates understanding / competency   Patient understands prevention, detection, and treatment of chronic complications. Demonstrates understanding / competency      Individualized Plan for Diabetes Self-Management Training:   Learning Objective:  Patient will have a greater understanding of diabetes self-management. Patient education plan is to attend individual and/or group sessions per assessed needs and concerns.   Plan:   There are no Patient Instructions on file for this visit.  Expected Outcomes:     Education material provided: {CHL AMB DSME EDUCATION MATERIAL:22611}  If problems or questions, patient to contact team via:  {TYPE OF CONTACT:20355}  Future DSME appointment:

## 2016-03-17 NOTE — Progress Notes (Deleted)
  Patient was seen on 03/17/16 for Gestational Diabetes self-management class at the [facility]. The following learning objectives were met by the patient during this course:   States the definition of Gestational Diabetes  States why dietary management is important in controlling blood glucose  Describes the effects each nutrient has on blood glucose levels  Demonstrates ability to create a balanced meal plan  Demonstrates carbohydrate counting   States when to check blood glucose levels  Demonstrates proper blood glucose monitoring techniques  States the effect of stress and exercise on blood glucose levels  States the importance of limiting caffeine and abstaining from alcohol and smoking  Blood glucose monitor given: yes Lot # [id number] Exp: 10/19/16 Blood glucose reading: 79  Patient instructed to monitor glucose levels: FBS: 60 - <90 1 hour: <140 2 hour: <120  *Patient received handouts:  Nutrition Diabetes and Pregnancy  Carbohydrate Counting List  Patient will be seen for follow-up as needed.

## 2016-03-17 NOTE — Progress Notes (Signed)
  Patient was seen on 03/17/16 for Gestational Diabetes self-management class at the [facility]. The following learning objectives were met by the patient during this course:   States the definition of Gestational Diabetes  States why dietary management is important in controlling blood glucose  Describes the effects each nutrient has on blood glucose levels  Demonstrates ability to create a balanced meal plan  Demonstrates carbohydrate counting   States when to check blood glucose levels  Demonstrates proper blood glucose monitoring techniques  States the effect of stress and exercise on blood glucose levels  States the importance of limiting caffeine and abstaining from alcohol and smoking  Blood glucose monitor given: yes Lot # [id number] Exp: 10/19/16 Blood glucose reading: 79  Patient instructed to monitor glucose levels: FBS: 60 - <90 1 hour: <140 2 hour: <120  *Patient received handouts:  Nutrition Diabetes and Pregnancy  Carbohydrate Counting List  Patient will be seen for follow-up as needed.

## 2016-03-24 ENCOUNTER — Ambulatory Visit (INDEPENDENT_AMBULATORY_CARE_PROVIDER_SITE_OTHER): Payer: Self-pay | Admitting: Women's Health

## 2016-03-24 ENCOUNTER — Encounter: Payer: Self-pay | Admitting: Women's Health

## 2016-03-24 VITALS — BP 100/70 | HR 72 | Wt 177.0 lb

## 2016-03-24 DIAGNOSIS — O9989 Other specified diseases and conditions complicating pregnancy, childbirth and the puerperium: Secondary | ICD-10-CM

## 2016-03-24 DIAGNOSIS — O0993 Supervision of high risk pregnancy, unspecified, third trimester: Secondary | ICD-10-CM

## 2016-03-24 DIAGNOSIS — O24410 Gestational diabetes mellitus in pregnancy, diet controlled: Secondary | ICD-10-CM

## 2016-03-24 DIAGNOSIS — Z3A35 35 weeks gestation of pregnancy: Secondary | ICD-10-CM

## 2016-03-24 DIAGNOSIS — Z331 Pregnant state, incidental: Secondary | ICD-10-CM

## 2016-03-24 DIAGNOSIS — Z1389 Encounter for screening for other disorder: Secondary | ICD-10-CM

## 2016-03-24 DIAGNOSIS — N3289 Other specified disorders of bladder: Secondary | ICD-10-CM

## 2016-03-24 LAB — POCT URINALYSIS DIPSTICK
Blood, UA: NEGATIVE
Glucose, UA: NEGATIVE
Ketones, UA: NEGATIVE
Leukocytes, UA: NEGATIVE
Nitrite, UA: NEGATIVE
PROTEIN UA: NEGATIVE

## 2016-03-24 NOTE — Patient Instructions (Signed)
Call the office [phone]) or go to [facility] if:  You begin to have strong, frequent contractions  Your water breaks.  Sometimes it is a big gush of fluid, sometimes it is just a trickle that keeps getting your panties wet or running down your legs  You have vaginal bleeding.  It is normal to have a small amount of spotting if your cervix was checked.   You don't feel your baby moving like normal.  If you don't, get you something to eat and drink and lay down and focus on feeling your baby move.  You should feel at least 10 movements in 2 hours.  If you don't, you should call the office or go to [facility].     Gestational Diabetes Mellitus, Diagnosis Gestational diabetes (gestational diabetes mellitus) is a temporary form of diabetes that some women develop during pregnancy. It usually occurs around weeks 24-28 of pregnancy and goes away after delivery. Hormonal changes during pregnancy can interfere with insulin production and function, which may result in one or both of these problems:  The pancreas does not make enough of a hormone called insulin.  Cells in the body do not respond properly to insulin that the body makes (insulin resistance). Normally, insulin allows sugars (glucose) to enter cells in the body. The cells use glucose for energy. Insulin resistance or lack of insulin causes excess glucose to build up in the blood instead of going into cells. As a result, high blood glucose (hyperglycemia) develops. What are the risks? If gestational diabetes is treated, it is unlikely to cause problems. If it is not controlled with treatment, it may cause problems during labor and delivery, and some of those problems can be harmful to the unborn baby (fetus) and the mother. Uncontrolled gestational diabetes may also cause the newborn baby to have breathing problems and low blood glucose. Women who get gestational diabetes are more likely to develop it if they get pregnant again,  and they are more likely to develop type 2 diabetes in the future. What increases the risk? This condition may be more likely to develop in pregnant women who:  Are older than age 31 during pregnancy.  Have a family history of diabetes.  Are overweight.  Had gestational diabetes in the past.  Have polycystic ovarian syndrome (PCOS).  Are pregnant with twins or multiples.  Are of American-Indian, African-American, Hispanic/Latino, or Asian/Pacific Islander descent. What are the signs or symptoms? Most women do not notice symptoms of gestational diabetes because the symptoms are similar to normal symptoms of pregnancy. Symptoms of gestational diabetes may include:  Increased thirst (polydipsia).  Increased hunger(polyphagia).  Increased urination (polyuria). How is this diagnosed?   This condition may be diagnosed based on your blood glucose level, which may be checked with one or more of the following blood tests:  A fasting blood glucose (FBG) test. You will not be allowed to eat (you will fast) for at least 8 hours before a blood sample is taken.  A random blood glucose test. This checks your blood glucose at any time of day regardless of when you ate.  An oral glucose tolerance test (OGTT). This is usually done during weeks 24-28 of pregnancy.  For this test, you will have an FBG test done. Then, you will drink a beverage that contains glucose. Your blood glucose will be tested again 1 hour after drinking the glucose beverage (1-hour OGTT).  If the 1-hour OGTT result is at or above 140 mg/dL (  7.8 mmol/L), you will repeat the OGTT. This time, your blood glucose will be tested 3 hours after drinking the glucose beverage (3-hour OGTT). If you have risk factors, you may be screened for undiagnosed type 2 diabetes at your first health care visit during your pregnancy (prenatal visit). How is this treated?   Your treatment may be managed by a specialist called an  endocrinologist. This condition is treated by following instructions from your health care provider about:  Eating a healthier diet and getting more physical activity. These changes are the most important ways to manage gestational diabetes.  Checking your blood glucose. Do this as often as told.  Taking diabetes medicines or insulin every day. These will only be prescribed if they are needed.  If you use insulin, you may need to adjust your dosage based on how physically active you are and what foods you eat. Your health care provider will tell you how to do this. Your health care provider will set treatment goals for you based on the stage of your pregnancy and any other medical conditions you have. Generally, the goal of treatment is to maintain the following blood glucose levels during pregnancy:  Fasting: at or below 95 mg/dL (5.3 mmol/L).  After meals (postprandial):  One hour after a meal: at or below 140 mg/dL (7.8 mmol/L).  Two hours after a meal: at or below 120 mg/dL (6.7 mmol/L).  A1c (hemoglobin A1c) level: 6-6.5%. Follow these instructions at home:  Take over-the-counter and prescription medicines only as told by your health care provider.  Manage your weight gain during pregnancy. The amount of weight that you are expected to gain depends on your pre-pregnancy BMI (body mass index).  Keep all follow-up visits as told by your health care provider. This is important. Consider asking your health care provider these questions:   Do I need to meet with a diabetes educator?  Where can I find a support group for people with diabetes?  What equipment will I need to manage my diabetes at home?  What diabetes medicines do I need, and when should I take them?  How often do I need to check my blood glucose?  What number can I call if I have questions?  When is my next appointment? Where to find more information:  For more information about diabetes, visit:  American  Diabetes Association (ADA): www.diabetes.org  American Association of Diabetes Educators (AADE): www.diabeteseducator.org/patient-resources Contact a health care provider if:  Your blood glucose level is at or above 240 mg/dL (16.113.3 mmol/L).  Your blood glucose level is at or above 200 mg/dL (09.611.1 mmol/L) and you have ketones in your urine.  You have been sick or have had a fever for 2 days or more and you are not getting better.  You have any of the following problems for more than 6 hours:  You cannot eat or drink.  You have nausea and vomiting.  You have diarrhea. Get help right away if:  Your blood glucose is below 54 mg/dL (3 mmol/L).  You become confused or you have trouble thinking clearly.  You have difficulty breathing.  You have moderate or large ketone levels in your urine.  Your baby is moving around less than usual.  You develop unusual discharge or bleeding from your vagina.  You start having contractions early (prematurely). Contractions may feel like a tightening in your lower abdomen. This information is not intended to replace advice given to you by your health care provider. Make  sure you discuss any questions you have with your health care provider. Document Released: 04/14/2000 Document Revised: 06/14/2015 Document Reviewed: 02/09/2015 Elsevier Interactive Patient Education  2017 [employer].

## 2016-03-24 NOTE — Progress Notes (Signed)
High Risk Pregnancy Diagnosis(es): A1DM G3P2002 911w0d Estimated Date of Delivery: 04/28/16 BP 100/70   Pulse 72   Wt 177 lb (80.3 kg)   LMP 07/23/2015   BMI 30.38 kg/m   Urinalysis: Negative HPI:  fbs 79-107 (1>90), 2hr pp 79-182 (6>120), has only checked 3-4 days since visit w/ dietician on 2/26 b/c she ran out of lancets. Has Contour Next glucometer, rx for lancets called into [facility] today. To check qid every day and bring log to next appt. Has started changing diet some, but still drinking sweet tea at times. Has gained 6lbs since last visit.  Reports feeling bladder spasms over weekend, some this am, does not feel like she has a uti, will send cx anyway.  BP, weight, and urine reviewed.  Reports good fm. Denies regular uc's, lof, vb, uti s/s. No complaints.  Fundal Height:  34 Fetal Heart rate:  140 Edema:  none  Reviewed ptl s/s, fkc. Discussed importance of strict glycemic control and adherence to low carb diet during pregnancy as well as potential complications from uncontrolled diabetes during pregnancy. All questions were answered Assessment: 6511w0d A1DM Medication(s) Plans:  None for now, not enough info/hasn't been good w/ diet Treatment Plan:  Growth u/s @ 38wks, IOL @ 40wks if remains A1DM Follow up in 1wk for high-risk OB appt, really work on being better about diet

## 2016-03-26 LAB — URINE CULTURE

## 2016-03-31 ENCOUNTER — Encounter: Payer: Self-pay | Admitting: Obstetrics and Gynecology

## 2016-04-01 ENCOUNTER — Encounter: Payer: Self-pay | Admitting: Obstetrics & Gynecology

## 2016-04-10 ENCOUNTER — Encounter: Payer: Self-pay | Admitting: Obstetrics & Gynecology

## 2016-04-10 ENCOUNTER — Ambulatory Visit (INDEPENDENT_AMBULATORY_CARE_PROVIDER_SITE_OTHER): Payer: Self-pay | Admitting: Obstetrics & Gynecology

## 2016-04-10 VITALS — BP 110/60 | HR 88 | Wt 180.0 lb

## 2016-04-10 DIAGNOSIS — O24410 Gestational diabetes mellitus in pregnancy, diet controlled: Secondary | ICD-10-CM

## 2016-04-10 DIAGNOSIS — Z3A37 37 weeks gestation of pregnancy: Secondary | ICD-10-CM

## 2016-04-10 DIAGNOSIS — Z331 Pregnant state, incidental: Secondary | ICD-10-CM

## 2016-04-10 DIAGNOSIS — Z3483 Encounter for supervision of other normal pregnancy, third trimester: Secondary | ICD-10-CM

## 2016-04-10 DIAGNOSIS — O0993 Supervision of high risk pregnancy, unspecified, third trimester: Secondary | ICD-10-CM

## 2016-04-10 DIAGNOSIS — Z1389 Encounter for screening for other disorder: Secondary | ICD-10-CM

## 2016-04-10 LAB — POCT URINALYSIS DIPSTICK
Blood, UA: NEGATIVE
GLUCOSE UA: NEGATIVE
KETONES UA: NEGATIVE
Nitrite, UA: NEGATIVE

## 2016-04-10 MED ORDER — TERCONAZOLE 0.4 % VA CREA: 1.0000 | TOPICAL_CREAM | Freq: Every day | VAGINAL | 0 refills | Status: DC

## 2016-04-10 NOTE — Progress Notes (Signed)
Fetal Surveillance Testing today:  FHR 145   High Risk Pregnancy Diagnosis(es):   Class A1 DM, well controlled  G3P2002 4915w3d Estimated Date of Delivery: 04/28/16  Blood pressure 110/60, pulse 88, weight 180 lb (81.6 kg), last menstrual period 07/23/2015, unknown if currently breastfeeding.  Urinalysis: Negative   HPI: The patient is being seen today for ongoing management of Class A1 DM. Today she reports CBG are good   BP weight and urine results all reviewed and noted. Patient reports good fetal movement, denies any bleeding and no rupture of membranes symptoms or regular contractions.  Fundal Height:  38 Fetal Heart rate:  145 Edema:  none  Patient is without complaints other than noted in her HPI. All questions were answered.  All lab and sonogram results have been reviewed. Comments:    Assessment:  1.  Pregnancy at 7515w3d,  Estimated Date of Delivery: 04/28/16 :                          2.  Class A1 DM                        3.    Medication(s) Plans:  none  Treatment Plan:  Sonogram EFW next week, induce 40 weeks  Return in about 1 week (around 04/17/2016) for sonogram for EFW, HROB. for appointment for high risk OB care  No orders of the defined types were placed in this encounter.  Orders Placed This Encounter  Procedures  . GC/Chlamydia Probe Amp  . Strep Gp B NAA  . US OB Follow Up  . POCT urinalysis dipstick

## 2016-04-10 NOTE — Addendum Note (Signed)
Addended by: Lazaro ArmsEURE, Amos Gaber H on: 04/10/2016 01:34 PM   Modules accepted: Orders

## 2016-04-12 LAB — GC/CHLAMYDIA PROBE AMP
CHLAMYDIA, DNA PROBE: NEGATIVE
NEISSERIA GONORRHOEAE BY PCR: NEGATIVE

## 2016-04-12 LAB — STREP GP B NAA: STREP GROUP B AG: NEGATIVE

## 2016-04-14 ENCOUNTER — Telehealth: Payer: Self-pay | Admitting: *Deleted

## 2016-04-14 NOTE — Telephone Encounter (Signed)
I called pt back to follow up and pt was feeling better. Contractions irregular now. Pt has had some vomiting. I advised that can come back at the end of pregnancy. Still having some back pain. Pt voiced understanding. JSY

## 2016-04-14 NOTE — Telephone Encounter (Signed)
Pt called with complaints of mid back pain and contractions every 10 minutes. It started 1 hour ago. + baby movement. I advised pt to get something to drink and lay down. Advised to call back around 1:30 with update. Pt voiced understanding. JSY

## 2016-04-16 ENCOUNTER — Ambulatory Visit (INDEPENDENT_AMBULATORY_CARE_PROVIDER_SITE_OTHER): Payer: Self-pay | Admitting: Obstetrics and Gynecology

## 2016-04-16 ENCOUNTER — Ambulatory Visit (INDEPENDENT_AMBULATORY_CARE_PROVIDER_SITE_OTHER): Payer: Self-pay

## 2016-04-16 ENCOUNTER — Encounter: Payer: Self-pay | Admitting: Obstetrics and Gynecology

## 2016-04-16 VITALS — BP 138/76 | HR 98 | Wt 183.6 lb

## 2016-04-16 DIAGNOSIS — O0990 Supervision of high risk pregnancy, unspecified, unspecified trimester: Secondary | ICD-10-CM

## 2016-04-16 DIAGNOSIS — Z1389 Encounter for screening for other disorder: Secondary | ICD-10-CM

## 2016-04-16 DIAGNOSIS — O24410 Gestational diabetes mellitus in pregnancy, diet controlled: Secondary | ICD-10-CM

## 2016-04-16 DIAGNOSIS — Z331 Pregnant state, incidental: Secondary | ICD-10-CM

## 2016-04-16 LAB — POCT URINALYSIS DIPSTICK
Glucose, UA: NEGATIVE
KETONES UA: NEGATIVE
NITRITE UA: NEGATIVE

## 2016-04-16 NOTE — Progress Notes (Signed)
US 38+2 wks,cephalic,post pl gr 3,fhr 152 bpm,afi 10 cm,EFW 3265 g 50%

## 2016-04-16 NOTE — Progress Notes (Signed)
Patient ID: Jamie Lutz, female   DOB: 06/25/1995, 21 y.o.   MRN: [id number] Fetal Surveillance Testing today:  US OB   High Risk Pregnancy Diagnosis(es):   A1DM  G3P2002 8658w2d Estimated Date of Delivery: 04/28/16  Blood pressure 138/76, pulse 98, weight 183 lb 9.6 oz (83.3 kg), last menstrual period 07/23/2015, unknown if currently breastfeeding.  Urinalysis: 2+ leukocytes, trace of blood, trace of protein, otherwise negative   HPI: The patient is being seen today for ongoing management of A1DM. Today she reports that her blood sugars have been mildly elevated, but overall controlled. Pt reports associated burning, non-pruritic rash and redness to left chin. Denies any other symptoms.   BP weight and urine results all reviewed and noted. Patient reports good fetal movement, denies any bleeding and no rupture of membranes symptoms or regular contractions.  Fundal Height:  37 cm Fetal Heart rate:  152 per US Edema:  Face: perioral erythema, Patient is without complaints other than noted in her HPI. All questions were answered.  All lab and sonogram results have been reviewed. Comments: normal  Assessment:  1.  Pregnancy at 458w2d,  Estimated Date of Delivery: 04/28/16 :                         2.  A1DM                        3 facial contact dermatitis  Medication(s) Plans:                           Topical 1% Hydrocortisone. Treatment Plan:  Follow up in 1 week for routine OB  No Follow-up on file. for appointment for high risk OB care  No orders of the defined types were placed in this encounter.  Orders Placed This Encounter  Procedures  . POCT Urinalysis Dipstick   By signing my name below, I, Soijett Blue, attest that this documentation has been prepared under the direction and in the presence of Tilda BurrowJohn D'Arcy Abraha V, MD. Electronically Signed: Soijett Blue, ED Scribe. 04/16/16. 3:56 PM.  I personally performed the services described in this documentation, which was  SCRIBED in my presence. The recorded information has been reviewed and considered accurate. It has been edited as necessary during review. Tilda BurrowFERGUSON,Leor Whyte V, MD

## 2016-04-23 ENCOUNTER — Inpatient Hospital Stay (HOSPITAL_COMMUNITY)
Admission: AD | Admit: 2016-04-23 | Discharge: 2016-04-25 | DRG: 775 | Disposition: A | Discharge status: Home / Self Care | Payer: Medicaid Other | Source: Ambulatory Visit | Attending: Obstetrics and Gynecology | Admitting: Obstetrics and Gynecology

## 2016-04-23 ENCOUNTER — Encounter (HOSPITAL_COMMUNITY): Payer: Self-pay | Admitting: *Deleted

## 2016-04-23 DIAGNOSIS — O24419 Gestational diabetes mellitus in pregnancy, unspecified control: Secondary | ICD-10-CM

## 2016-04-23 DIAGNOSIS — O99344 Other mental disorders complicating childbirth: Secondary | ICD-10-CM | POA: Diagnosis present

## 2016-04-23 DIAGNOSIS — F419 Anxiety disorder, unspecified: Secondary | ICD-10-CM | POA: Diagnosis present

## 2016-04-23 DIAGNOSIS — Z3493 Encounter for supervision of normal pregnancy, unspecified, third trimester: Secondary | ICD-10-CM | POA: Diagnosis present

## 2016-04-23 DIAGNOSIS — O479 False labor, unspecified: Secondary | ICD-10-CM

## 2016-04-23 DIAGNOSIS — Z833 Family history of diabetes mellitus: Secondary | ICD-10-CM

## 2016-04-23 DIAGNOSIS — Z3A39 39 weeks gestation of pregnancy: Secondary | ICD-10-CM | POA: Diagnosis not present

## 2016-04-23 DIAGNOSIS — O24420 Gestational diabetes mellitus in childbirth, diet controlled: Principal | ICD-10-CM | POA: Diagnosis present

## 2016-04-23 DIAGNOSIS — O24429 Gestational diabetes mellitus in childbirth, unspecified control: Secondary | ICD-10-CM

## 2016-04-23 HISTORY — DX: Gestational diabetes mellitus in pregnancy, unspecified control: O24.419

## 2016-04-23 HISTORY — DX: Type 2 diabetes mellitus without complications: E11.9

## 2016-04-23 LAB — CBC
HCT: 37.1 % (ref 36.0–46.0)
Hemoglobin: 12.4 g/dL (ref 12.0–15.0)
MCH: 28.4 pg (ref 26.0–34.0)
MCHC: 33.4 g/dL (ref 30.0–36.0)
MCV: 84.9 fL (ref 78.0–100.0)
PLATELETS: 154 10*3/uL (ref 150–400)
RBC: 4.37 MIL/uL (ref 3.87–5.11)
RDW: 16.9 % — ABNORMAL HIGH (ref 11.5–15.5)
WBC: 10.9 10*3/uL — AB (ref 4.0–10.5)

## 2016-04-23 LAB — TYPE AND SCREEN
ABO/RH(D): O POS
ANTIBODY SCREEN: NEGATIVE

## 2016-04-23 LAB — GLUCOSE, CAPILLARY: Glucose-Capillary: 105 mg/dL — ABNORMAL HIGH (ref 65–99)

## 2016-04-23 MED ORDER — ZOLPIDEM TARTRATE 5 MG PO TABS: 5.0000 mg | ORAL_TABLET | Freq: Every evening | ORAL | Status: DC | PRN

## 2016-04-23 MED ORDER — IBUPROFEN 600 MG PO TABS
600.0000 mg | ORAL_TABLET | Freq: Four times a day (QID) | ORAL | Status: DC
  Administered 2016-04-23 – 2016-04-25 (×8): 600 mg via ORAL
  Filled 2016-04-23 (×8): qty 1

## 2016-04-23 MED ORDER — LIDOCAINE HCL (PF) 1 % IJ SOLN
30.0000 mL | INTRAMUSCULAR | Status: DC | PRN
  Filled 2016-04-23: qty 30

## 2016-04-23 MED ORDER — WITCH HAZEL-GLYCERIN EX PADS: 1.0000 "application " | MEDICATED_PAD | CUTANEOUS | Status: DC | PRN

## 2016-04-23 MED ORDER — PRENATAL MULTIVITAMIN CH
1.0000 | ORAL_TABLET | Freq: Every day | ORAL | Status: DC
  Administered 2016-04-24 – 2016-04-25 (×2): 1 via ORAL
  Filled 2016-04-23 (×2): qty 1

## 2016-04-23 MED ORDER — OXYCODONE-ACETAMINOPHEN 5-325 MG PO TABS: 2.0000 | ORAL_TABLET | ORAL | Status: DC | PRN

## 2016-04-23 MED ORDER — ONDANSETRON HCL 4 MG/2ML IJ SOLN: 4.0000 mg | Freq: Four times a day (QID) | INTRAMUSCULAR | Status: DC | PRN

## 2016-04-23 MED ORDER — LACTATED RINGERS IV SOLN
INTRAVENOUS | Status: DC
  Administered 2016-04-23: 12:00:00 via INTRAVENOUS

## 2016-04-23 MED ORDER — ONDANSETRON HCL 4 MG/2ML IJ SOLN: 4.0000 mg | INTRAMUSCULAR | Status: DC | PRN

## 2016-04-23 MED ORDER — SENNOSIDES-DOCUSATE SODIUM 8.6-50 MG PO TABS
2.0000 | ORAL_TABLET | ORAL | Status: DC
  Administered 2016-04-23 – 2016-04-25 (×2): 2 via ORAL
  Filled 2016-04-23 (×2): qty 2

## 2016-04-23 MED ORDER — FENTANYL CITRATE (PF) 100 MCG/2ML IJ SOLN
100.0000 ug | INTRAMUSCULAR | Status: DC | PRN
  Administered 2016-04-23 (×2): 100 ug via INTRAVENOUS
  Filled 2016-04-23 (×2): qty 2

## 2016-04-23 MED ORDER — DIPHENHYDRAMINE HCL 25 MG PO CAPS: 25.0000 mg | ORAL_CAPSULE | Freq: Four times a day (QID) | ORAL | Status: DC | PRN

## 2016-04-23 MED ORDER — BENZOCAINE-MENTHOL 20-0.5 % EX AERO
1.0000 "application " | INHALATION_SPRAY | CUTANEOUS | Status: DC | PRN
  Administered 2016-04-23: 1 via TOPICAL
  Filled 2016-04-23: qty 56

## 2016-04-23 MED ORDER — ACETAMINOPHEN 325 MG PO TABS: 650.0000 mg | ORAL_TABLET | ORAL | Status: DC | PRN

## 2016-04-23 MED ORDER — OXYTOCIN 40 UNITS IN LACTATED RINGERS INFUSION - SIMPLE MED
2.5000 [IU]/h | INTRAVENOUS | Status: DC
  Filled 2016-04-23: qty 1000

## 2016-04-23 MED ORDER — OXYTOCIN BOLUS FROM INFUSION
500.0000 mL | Freq: Once | INTRAVENOUS | Status: AC
  Administered 2016-04-23: 500 mL via INTRAVENOUS

## 2016-04-23 MED ORDER — COCONUT OIL OIL: 1.0000 "application " | TOPICAL_OIL | Status: DC | PRN

## 2016-04-23 MED ORDER — ONDANSETRON HCL 4 MG PO TABS: 4.0000 mg | ORAL_TABLET | ORAL | Status: DC | PRN

## 2016-04-23 MED ORDER — SOD CITRATE-CITRIC ACID 500-334 MG/5ML PO SOLN: 30.0000 mL | ORAL | Status: DC | PRN

## 2016-04-23 MED ORDER — TETANUS-DIPHTH-ACELL PERTUSSIS 5-2.5-18.5 LF-MCG/0.5 IM SUSP: 0.5000 mL | Freq: Once | INTRAMUSCULAR | Status: DC

## 2016-04-23 MED ORDER — ACETAMINOPHEN 325 MG PO TABS
650.0000 mg | ORAL_TABLET | ORAL | Status: DC | PRN
  Administered 2016-04-23: 650 mg via ORAL
  Filled 2016-04-23: qty 2

## 2016-04-23 MED ORDER — SIMETHICONE 80 MG PO CHEW: 80.0000 mg | CHEWABLE_TABLET | ORAL | Status: DC | PRN

## 2016-04-23 MED ORDER — LACTATED RINGERS IV SOLN: 500.0000 mL | INTRAVENOUS | Status: DC | PRN

## 2016-04-23 MED ORDER — DIBUCAINE 1 % RE OINT: 1.0000 "application " | TOPICAL_OINTMENT | RECTAL | Status: DC | PRN

## 2016-04-23 MED ORDER — OXYCODONE-ACETAMINOPHEN 5-325 MG PO TABS: 1.0000 | ORAL_TABLET | ORAL | Status: DC | PRN

## 2016-04-23 NOTE — Lactation Note (Signed)
This note was copied from a baby's chart. Lactation Consultation Note  Patient Name: Jamie Lutz [id number] Date: 04/23/2016 Reason for consult: Initial assessment   Initial assessment with Exp BF mom of < 1 hour old infant in [place]. Mom speaks good english and denies need for interpreter. Mom reports she did not BF her 21 yo, she BF her 21 yo with formula supplementation. Mom voiced she does not think she has enough milk. Colostrum, NB nutritional needs, supply and demand and milk coming to volume. Enc mom to BF infant STS 8-12 x in 24 hours at first feeding cues offering both breasts with each feeding. Mom reports she plans to BF and formula feed in the hospital, enc mom to offer breast prior to offering formula.   Showed mom how to hand express and colostrum easily expressible. Mom with compressible breasts and areola with erect nipples. Discussed that mom can hand express and spoon feed infant colostrum. Enc mom to massage/compress breast with feeding. BF basics, positioning, STS, cluster feeding, hand expression reviewed. Feeding log given with instructions for use.   BF Resources Handout and LC Brochure given, mom informed of IP/OP Services, BF Support Groups and LC phone #. Enc mom to call out for feeding assistance as needed. Mom is a [facility] client and is aware to call and make appt. Mom does not have a pump at home. Mom without further questions/concerns.    Maternal Data Formula Feeding for Exclusion: Yes Reason for exclusion: Mother's choice to formula and breast feed on admission Has patient been taught Hand Expression?: Yes Does the patient have breastfeeding experience prior to this delivery?: Yes  Feeding Feeding Type: Breast Fed Length of feed: 5 min (still feeding when LC left room)  LATCH Score/Interventions Latch: Grasps breast easily, tongue down, lips flanged, rhythmical sucking.  Audible Swallowing: A few with stimulation Intervention(s): Skin to skin;Hand  expression;Alternate breast massage  Type of Nipple: Everted at rest and after stimulation  Comfort (Breast/Nipple): Soft / non-tender     Hold (Positioning): Assistance needed to correctly position infant at breast and maintain latch. Intervention(s): Breastfeeding basics reviewed;Support Pillows;Position options;Skin to skin  LATCH Score: 8  Lactation Tools Discussed/Used WIC Program: Yes   Consult Status Consult Status: Follow-up Date: 04/24/16 Follow-up type: In-patient    Jamie Lutz 04/23/2016, 6:10 PM

## 2016-04-23 NOTE — Anesthesia Pain Management Evaluation Note (Signed)
  CRNA Pain Management Visit Note  Patient: Jamie Lutz, 21 y.o., female  "Hello I am a member of the anesthesia team at [facility]. We have an anesthesia team available at all times to provide care throughout the hospital, including epidural management and anesthesia for C-section. I don't know your plan for the delivery whether it a natural birth, water birth, IV sedation, nitrous supplementation, doula or epidural, but we want to meet your pain goals."   1.Was your pain managed to your expectations on prior hospitalizations?   Yes   2.What is your expectation for pain management during this hospitalization?     Labor support without medications  3.How can we help you reach that goal? unsure  Record the patient's initial score and the patient's pain goal.   Pain: 7  Pain Goal: 10 [facility] wants you to be able to say your pain was always managed very well.  Cephus Shelling 04/23/2016

## 2016-04-23 NOTE — H&P (Signed)
LABOR AND DELIVERY ADMISSION HISTORY AND PHYSICAL NOTE  Jamie Lutz is a 21 y.o. female G45P2002 with IUP at [redacted]w[redacted]d by [place] (12 weeks) presenting for SOL. Patient started experiencing contractions yesterday night accompanied wit some fluid leakage. Patient reports good fetal movement. Patient with A1DM  And history of anxiety on prozac discontinue two months ago. Patient received prenatal care at [facility].  She reports positive fetal movement. She denies leakage of fluid or vaginal bleeding.  Prenatal History/Complications:  Past Medical History: Past Medical History:  Diagnosis Date  . Anxiety   . Diabetes mellitus without complication (HCC)   . Gestational diabetes   . No pertinent past medical history     Past Surgical History: Past Surgical History:  Procedure Laterality Date  . NO PAST SURGERIES    . PERINEAL LACERATION REPAIR  04/17/2011   Procedure: SUTURE REPAIR PERINEAL LACERATION;  Surgeon: Tilda Burrow, MD;  Location: [facility];  Service: Gynecology;  Laterality: N/A;    Obstetrical History: OB History    Gravida Para Term Preterm AB Living   0 0 2   SAB TAB Ectopic Multiple Live Births   0 0 0 0 2      Social History: Social History   Social History  . Marital status: Married    Spouse name: N/A  . Number of children: 2  . Years of education: N/A   Social History Main Topics  . Smoking status: Never Smoker  . Smokeless tobacco: Never Used  . Alcohol use No  . Drug use: No  . Sexual activity: Not Currently    Birth control/ protection: None     Comment: last intercourse: 2-4 weeks ago   Other Topics Concern  . None   Social History Narrative  . None    Family History: Family History  Problem Relation Age of Onset  . Hypothyroidism Mother   . Diabetes Maternal Grandmother   . Cancer Maternal Grandfather     esophagus    Allergies: No Known Allergies  Prescriptions Prior to Admission  Medication Sig Dispense Refill Last Dose   . Ca Carbonate-Mag Hydroxide (ROLAIDS PO) Take 2 tablets by mouth daily as needed (heartburn).   Past Week at Unknown time  . calcium carbonate (TUMS - DOSED IN MG ELEMENTAL CALCIUM) 500 MG chewable tablet Chew 2 tablets by mouth 3 (three) times daily as needed for indigestion or heartburn.   Past Week at Unknown time  . Prenatal Multivit-Min-Fe-FA (PRENATAL VITAMINS) 0.8 MG tablet Take 1 tablet by mouth daily. 30 tablet 12 04/22/2016 at Unknown time  . terconazole (TERAZOL 7) 0.4 % vaginal cream Place 1 applicator vaginally at bedtime. 45 g 0 04/19/2016 at Unknown time     Review of Systems   All systems reviewed and negative except as stated in HPI  Blood pressure 113/66, pulse 96, temperature 98.4 F (36.9 C), temperature source Oral, resp. rate 18, height  (1.626 m), weight 182 lb (82.6 kg), last menstrual period 07/23/2015, unknown if currently breastfeeding. General appearance: alert, cooperative and appears stated age Lungs: Normal effort and no audible wheezing Heart: regular rate and pulses palpated distally Abdomen: soft, non-tender Extremities: No calf swelling or tenderness Presentation: cephalic per nurse exam Fetal monitoring: FHR 140 Uterine activity: Regular every 5 min  Dilation: 2 Effacement (%): 70 Station: -3 Exam by:: Morrison Old RN   Prenatal labs: ABO, Rh: O/Positive/-- (10/26 1551) Antibody: Negative (01/29 0834) Rubella: Immune RPR: Non Reactive (01/29  1610)  HBsAg: Negative (10/26 1551)  HIV: Non Reactive (01/29 0834)  GBS: Negative (03/22 1330)  1 hr Glucola: 180 Genetic screening:  Negative Anatomy US: Normal female  Prenatal Transfer Tool  Maternal Diabetes: Yes:  Diabetes Type:  Diet controlled Genetic Screening: Normal Maternal Ultrasounds/Referrals: Normal Fetal Ultrasounds or other Referrals:  None Maternal Substance Abuse:  No Significant Maternal Medications:  None Significant Maternal Lab Results: None  No results found for this or  any previous visit (from the past 24 hour(s)).  Patient Active Problem List   Diagnosis Date Noted  . Depression 03/10/2016  . Gestational diabetes mellitus, class A1 02/25/2016  . Supervision of high risk pregnancy, antepartum 11/15/2015  . Chest pain 08/28/2015  . Cephalalgia 06/09/2015  . Adjustment disorder with anxious mood 11/03/2014  . 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 Chronic abdominal pain 05/03/2014  . Constipation 05/03/2014  . Paresthesia 02/06/2014    Assessment: Jamie Lutz is a 21 y.o. G3P2002 at [redacted]w[redacted]d here for SOL. Patient currently with regular contractions. Pain has been manageable so far. Will continue to monitor and reassess need for augmentation.  #Labor: Expectant management , possible augmentation #Pain: IV pain meds, No epidural #FWB: Cat 1 #ID:  GBS neg #MOF: breast and bottle #MOC: Undecided did not like Depo or Nexplanon #Circ:  Declined  Abdoulaye Diallo 04/23/2016, 11:41 AM  OB FELLOW HISTORY AND PHYSICAL ATTESTATION  I confirm that I have verified the information documented in the resident's note and that I have also personally reperformed the physical exam and all medical decision making activities.      Ernestina Penna 04/23/2016, 4:10 PM

## 2016-04-23 NOTE — Progress Notes (Signed)
LABOR PROGRESS NOTE  Jamie Lutz is a 21 y.o. G3P2002 at [redacted]w[redacted]d  admitted for SOL.  Subjective: Patient experiencing some discomfort but seem to be managing pain well. Patient feeling contractions with same degree of intensity.  Objective: BP 115/71 (BP Location: Right Arm)   Pulse 81   Temp 98.2 F (36.8 C) (Oral)   Resp 18   Ht  (1.626 m)   Wt 182 lb (82.6 kg)   LMP 07/23/2015   BMI 31.24 kg/m  or  Vitals:   04/23/16 0829 04/23/16 1249 04/23/16 1326 04/23/16 1539  BP: 113/66 114/65 115/71   Pulse: 96 78 81   Resp: Temp: 98.4 F (36.9 C) 98.3 F (36.8 C)  98.2 F (36.8 C)  TempSrc: Oral Oral  Oral  Weight: 182 lb (82.6 kg)     Height:  (1.626 m)       Patient is alert and oriented, able to answer questions. Cardiac, pulmonary and abdominal exam have been within normal limit. Dilation: 5 Effacement (%): 100 Station: -2 Presentation: Vertex Exam by:: Dr. Genevie Ann  Labs: Lab Results  Component Value Date   WBC 10.9 (H) 04/23/2016   HGB 12.4 04/23/2016   HCT 37.1 04/23/2016   MCV 84.9 04/23/2016   PLT 154 04/23/2016    Patient Active Problem List   Diagnosis Date Noted  . Depression 03/10/2016  . Gestational diabetes mellitus, class A1 02/25/2016  . Supervision of high risk pregnancy, antepartum 11/15/2015  . Chest pain 08/28/2015  . Cephalalgia 06/09/2015  . Adjustment disorder with anxious mood 11/03/2014  . 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 Chronic abdominal pain 05/03/2014  . Constipation 05/03/2014  . Paresthesia 02/06/2014    Assessment / Plan: 21 y.o. G3P2002 at [redacted]w[redacted]d here for SOL. Patient has contractions every 6-7 min. Does not desire epidural. Will provide IV pain meds.  Labor: Expectant management  Fetal Wellbeing:  Category 1 Pain Control:  IV pain meds, no epidural Anticipated MOD:  Vaginal  Lovena Neighbours, MD 04/23/2016, 3:45 PM

## 2016-04-23 NOTE — Progress Notes (Signed)
Patient seen. Doing well. Getting more uncomfortable. Patient has been having irregular contractions every 3-11 minutes, but reports they are getting stronger. Cervix is 5/100/-2. AROM for clear fluid.  Ernestina Penna, MD 04/23/16 1600

## 2016-04-23 NOTE — MAU Note (Signed)
c/o ucs since 2200 last night;

## 2016-04-23 NOTE — MAU Note (Signed)
Pt reports ctx starred last night around 10pm. Now about 7 min apart. Had some leaking last night nonereported now. Good fetal movement felt.

## 2016-04-24 ENCOUNTER — Encounter: Payer: Self-pay | Admitting: Women's Health

## 2016-04-24 LAB — RPR: RPR: NONREACTIVE

## 2016-04-24 LAB — GLUCOSE, CAPILLARY: Glucose-Capillary: 74 mg/dL (ref 65–99)

## 2016-04-24 NOTE — Progress Notes (Signed)
CSW attempted to meet with [name] to complete assessment for hx of Anx/Dep, but she was eating lunch and had company.  [name] states she is doing well, denies any current concerns or needs, and agrees for CSW to return at a later time.

## 2016-04-24 NOTE — Progress Notes (Signed)
Post Partum Day #1 Subjective: no complaints, up ad lib, voiding, tolerating PO and reports normal lochia  Objective: Blood pressure (!) 109/55, pulse 75, temperature 98.7 F (37.1 C), temperature source Oral, resp. rate 18, height  (1.626 m), weight 82.6 kg (182 lb), last menstrual period 07/23/2015, SpO2 98 %, unknown if currently breastfeeding.  Physical Exam:  General: alert Lochia: appropriate Uterine Fundus: firm and NT at U DVT Evaluation: No evidence of DVT seen on physical exam.   Recent Labs  04/23/16 1127  HGB 12.4  HCT 37.1    Assessment/Plan: Plan for discharge tomorrow  She declines contraception.   LOS: 1 day   Allie Bossier 04/24/2016, 6:58 AM

## 2016-04-25 MED ORDER — IBUPROFEN 600 MG PO TABS: 600.0000 mg | ORAL_TABLET | Freq: Four times a day (QID) | ORAL | 0 refills | Status: DC

## 2016-04-25 NOTE — Lactation Note (Signed)
This note was copied from a baby's chart. Lactation Consultation Note  Patient Name: Jamie Lutz Date: 04/25/2016 Reason for consult: Follow-up assessment;Infant weight loss (5% weight loss ) Baby is 53 hours old and has been to the breast X4 since yesterday's consult.  LC discussed supply and demand, and the importance of giving the baby practice at the breast . Offer both breast , and if the baby is satisfied , hold off on the supplement.  If not use the supplement , using the guidelines. ( mom has the supplementing guidelines ). LC suggested to mom for now to keep the supplementing at 25 -30 ml, and give the baby practice at the breast.  Sore nipple and engorgement prevention and t x. LC instructed mom on the use of hand pump and shells.  Mother informed of post-discharge support and given phone number to the lactation department, including services for phone call assistance; out-patient appointments; and breastfeeding support group. List of other breastfeeding resources in the community given in the handout. Encouraged mother to call for problems or concerns related to breastfeeding.  Maternal Data    Feeding Feeding Type: Bottle Fed - Formula Length of feed: 20 min  LATCH Score/Interventions                Intervention(s): Breastfeeding basics reviewed     Lactation Tools Discussed/Used Tools: Shells;Pump Shell Type: Inverted Breast pump type: Manual Pump Review: Setup, frequency, and cleaning;Milk Storage Initiated by:: MAI  Date initiated:: 04/25/16   Consult Status Consult Status: Complete Date: 04/25/16    Jamie Lutz 04/25/2016, 11:03 AM

## 2016-04-25 NOTE — Discharge Instructions (Signed)

## 2016-04-25 NOTE — Discharge Summary (Signed)
OB Discharge Summary  Patient Name: Jamie Lutz DOB: 01-31-1995 MRN: [id number]  Date of admission: 04/23/2016 Delivering MD: Lorne Skeens   Date of discharge: 04/25/2016  Admitting diagnosis: 39WKS,CTX Intrauterine pregnancy: [redacted]w[redacted]d     Secondary diagnosis:Active Problems:   Normal labor     Discharge diagnosis: Term Pregnancy Delivered and GDM A1                                                                      Augmentation: AROM  Complications: None  Hospital course:  Onset of Labor With Vaginal Delivery     21 y.o. yo G3P3003 at [redacted]w[redacted]d was admitted in Active Labor on 04/23/2016. Patient had an uncomplicated labor course as follows:  Membrane Rupture Time/Date: 3:33 PM ,04/23/2016   Intrapartum Procedures: Episiotomy: None [1]                                         Lacerations:  1st degree [2];Perineal [11]  Patient had a delivery of a Viable infant. 04/23/2016  Information for the patient's newborn:  Katlyne, Nishida [[id number]]  Delivery Method: Vag-Spont    Pateint had an uncomplicated postpartum course.  She is ambulating, tolerating a regular diet, passing flatus, and urinating well. Patient is discharged home in stable condition on 04/25/16.   Physical exam  Vitals:   04/24/16 0537 04/24/16 0745 04/24/16 1736 04/25/16 0633  BP: (!) 109/55 (!) 89/59 116/68 104/60  Pulse: 75 78 88 75  Resp: Temp: 98.7 F (37.1 C) 98.3 F (36.8 C) 97.4 F (36.3 C) 97.8 F (36.6 C)  TempSrc: Oral Oral Oral Oral  SpO2:  99%    Weight:      Height:       General: alert Lochia: appropriate Uterine Fundus: firm Incision: N/A DVT Evaluation: No evidence of DVT seen on physical exam. Labs: Lab Results  Component Value Date   WBC 10.9 (H) 04/23/2016   HGB 12.4 04/23/2016   HCT 37.1 04/23/2016   MCV 84.9 04/23/2016   PLT 154 04/23/2016   CMP Latest Ref Rng & Units 09/11/2015  Glucose 65 - 99 mg/dL 91  BUN 6 - 20 mg/dL 8  Creatinine  5.36 - 6.44 mg/dL 0.34  Sodium 742 - 595 mmol/L 135  Potassium 3.5 - 5.1 mmol/L 3.8  Chloride 101 - 111 mmol/L 105  CO2 22 - 32 mmol/L 25  Calcium 8.9 - 10.3 mg/dL 9.0  Total Protein 6.5 - 8.1 g/dL 6.2(L)  Total Bilirubin 0.3 - 1.2 mg/dL 0.3  Alkaline Phos 38 - 126 U/L 61  AST 15 - 41 U/L 19  ALT 14 - 54 U/L 14    Discharge instruction: per After Visit Summary and "Baby and Me Booklet".  After Visit Meds:  Allergies as of 04/25/2016   No Known Allergies     Medication List    STOP taking these medications   terconazole 0.4 % vaginal cream Commonly known as:  TERAZOL 7     TAKE these medications   calcium carbonate 500 MG chewable tablet Commonly known as:  TUMS -  dosed in mg elemental calcium Chew 2 tablets by mouth 3 (three) times daily as needed for indigestion or heartburn.   ibuprofen 600 MG tablet Commonly known as:  ADVIL,MOTRIN Take 1 tablet (600 mg total) by mouth every 6 (six) hours.   Prenatal Vitamins 0.8 MG tablet Take 1 tablet by mouth daily.   ROLAIDS PO Take 2 tablets by mouth daily as needed (heartburn).       Diet: routine diet  Activity: Advance as tolerated. Pelvic rest for 6 weeks.   Outpatient follow up:6 weeks Follow up Appt:Future Appointments Date Time Provider Department Center  06/05/2016 10:00 AM Cheral Marker, CNM [facility]   Follow up visit: No Follow-up on file.  Postpartum contraception: She declines  Newborn Data: Live born female  Birth Weight: 6 lb 11.8 oz (3056 g) APGAR: 8, 9  Baby Feeding: mostly bottle, some breast Disposition:home with mother   04/25/2016 Allie Bossier, MD

## 2016-04-25 NOTE — Lactation Note (Signed)
This note was copied from a baby's chart. Lactation Consultation Note Experienced BF mom's 3rd baby. Mom stated her milk hasn't come in yet. Explained comes 3-5 days. Discussed importance of colostrum. Mom had baby on breast in cradle position when LC entered rm. Mom sitting on side of bed.  Mom is also supplementing w/formula after feedings. Mom is quiet. Asked mom if she has any questions. Mom stated not right now. Asked mom if she would like LC to come by again before mom goes home today, mom stated yes.  Encouraged mom to cont. To document I&O after d/c home. Asked mom if she was going to supplement w/formula after d/c home, mom stated yes. Encouraged to cont. To BF first to keep milk supply up. Reviewed supply and demand.  Patient Name: Jamie Lutz Date: 04/25/2016 Reason for consult: Follow-up assessment   Maternal Data    Feeding Feeding Type: Breast Fed Nipple Type: Slow - flow Length of feed: 20 min  LATCH Score/Interventions Latch: Grasps breast easily, tongue down, lips flanged, rhythmical sucking.  Audible Swallowing: A few with stimulation Intervention(s): Hand expression  Type of Nipple: Everted at rest and after stimulation  Comfort (Breast/Nipple): Soft / non-tender     Hold (Positioning): No assistance needed to correctly position infant at breast. Intervention(s): Position options;Support Pillows;Breastfeeding basics reviewed;Skin to skin  LATCH Score: 9  Lactation Tools Discussed/Used     Consult Status Consult Status: Follow-up Date: 04/25/16 Follow-up type: In-patient    Charyl Dancer 04/25/2016, 3:37 AM

## 2016-06-05 ENCOUNTER — Ambulatory Visit (INDEPENDENT_AMBULATORY_CARE_PROVIDER_SITE_OTHER): Payer: Self-pay | Admitting: Women's Health

## 2016-06-05 ENCOUNTER — Encounter: Payer: Self-pay | Admitting: Women's Health

## 2016-06-05 DIAGNOSIS — F418 Other specified anxiety disorders: Secondary | ICD-10-CM | POA: Insufficient documentation

## 2016-06-05 DIAGNOSIS — R350 Frequency of micturition: Secondary | ICD-10-CM

## 2016-06-05 DIAGNOSIS — Z8632 Personal history of gestational diabetes: Secondary | ICD-10-CM

## 2016-06-05 LAB — POCT URINALYSIS DIPSTICK
Blood, UA: NEGATIVE
GLUCOSE UA: NEGATIVE
KETONES UA: NEGATIVE
LEUKOCYTES UA: NEGATIVE
Nitrite, UA: NEGATIVE
PROTEIN UA: NEGATIVE

## 2016-06-05 MED ORDER — ESCITALOPRAM OXALATE 10 MG PO TABS: 10.0000 mg | ORAL_TABLET | Freq: Every day | ORAL | 6 refills | Status: DC

## 2016-06-05 NOTE — Patient Instructions (Addendum)
You will have your sugar test next visit.  Please do not eat or drink anything after midnight the night before you come, not even water.  You will be here for at least two hours.     Constipation  Drink plenty of fluid, preferably water, throughout the day  Eat foods high in fiber such as fruits, vegetables, and grains  Exercise, such as walking, is a good way to keep your bowels regular  Drink warm fluids, especially warm prune juice, or decaf coffee  Eat a 1/2 cup of real oatmeal (not instant), 1/2 cup applesauce, and 1/2-1 cup warm prune juice every day  If needed, you may take Colace (docusate sodium) stool softener once or twice a day to help keep the stool soft. If you are pregnant, wait until you are out of your first trimester (12-14 weeks of pregnancy)  If you still are having problems with constipation, you may take Miralax once daily as needed to help keep your bowels regular.  If you are pregnant, wait until you are out of your first trimester (12-14 weeks of pregnancy)    Major Depressive Disorder, Adult Major depressive disorder (MDD) is a mental health condition. It may also be called clinical depression or unipolar depression. MDD usually causes feelings of sadness, hopelessness, or helplessness. MDD can also cause physical symptoms. It can interfere with work, school, relationships, and other everyday activities. MDD may be mild, moderate, or severe. It may occur once (single episode major depressive disorder) or it may occur multiple times (recurrent major depressive disorder). What are the causes? The exact cause of this condition is not known. MDD is most likely caused by a combination of things, which may include:  Genetic factors. These are traits that are passed along from parent to child.  Individual factors. Your personality, your behavior, and the way you handle your thoughts and feelings may contribute to MDD. This includes personality traits and behaviors learned  from others.  Physical factors, such as:  Differences in the part of your brain that controls emotion. This part of your brain may be different than it is in people who do not have MDD.  Long-term (chronic) medical or psychiatric illnesses.  Social factors. Traumatic experiences or major life changes may play a role in the development of MDD. What increases the risk? This condition is more likely to develop in women. The following factors may also make you more likely to develop MDD:  A family history of depression.  Troubled family relationships.  Abnormally low levels of certain brain chemicals.  Traumatic events in childhood, especially abuse or the loss of a parent.  Being under a lot of stress, or long-term stress, especially from upsetting life experiences or losses.  A history of:  Chronic physical illness.  Other mental health disorders.  Substance abuse.  Poor living conditions.  Experiencing social exclusion or discrimination on a regular basis. What are the signs or symptoms? The main symptoms of MDD typically include:  Constant depressed or irritable mood.  Loss of interest in things and activities. MDD symptoms may also include:  Sleeping or eating too much or too little.  Unexplained weight change.  Fatigue or low energy.  Feelings of worthlessness or guilt.  Difficulty thinking clearly or making decisions.  Thoughts of suicide or of harming others.  Physical agitation or weakness.  Isolation. Severe cases of MDD may also occur with other symptoms, such as:  Delusions or hallucinations, in which you imagine things that are  not real (psychotic depression).  Low-level depression that lasts at least a year (chronic depression or persistent depressive disorder).  Extreme sadness and hopelessness (melancholic depression).  Trouble speaking and moving (catatonic depression). How is this diagnosed? This condition may be diagnosed based  on:  Your symptoms.  Your medical history, including your mental health history. This may involve tests to evaluate your mental health. You may be asked questions about your lifestyle, including any drug and alcohol use, and how long you have had symptoms of MDD.  A physical exam.  Blood tests to rule out other conditions. You must have a depressed mood and at least four other MDD symptoms most of the day, nearly every day in the same 2-week timeframe before your health care provider can confirm a diagnosis of MDD. How is this treated? This condition is usually treated by mental health professionals, such as psychologists, psychiatrists, and clinical social workers. You may need more than one type of treatment. Treatment may include:  Psychotherapy. This is also called talk therapy or counseling. Types of psychotherapy include:  Cognitive behavioral therapy (CBT). This type of therapy teaches you to recognize unhealthy feelings, thoughts, and behaviors, and replace them with positive thoughts and actions.  Interpersonal therapy (IPT). This helps you to improve the way you relate to and communicate with others.  Family therapy. This treatment includes members of your family.  Medicine to treat anxiety and depression, or to help you control certain emotions and behaviors.  Lifestyle changes, such as:  Limiting alcohol and drug use.  Exercising regularly.  Getting plenty of sleep.  Making healthy eating choices.  Spending more time outdoors. Treatments involving stimulation of the brain can be used in situations with extremely severe symptoms, or when medicine or other therapies do not work over time. These treatments include electroconvulsive therapy, transcranial magnetic stimulation, and vagal nerve stimulation. Follow these instructions at home: Activity   Return to your normal activities as told by your health care provider.  Exercise regularly and spend time outdoors as  told by your health care provider. General instructions   Take over-the-counter and prescription medicines only as told by your health care provider.  Do not drink alcohol. If you drink alcohol, limit your alcohol intake to no more than 1 drink a day for nonpregnant women and 2 drinks a day for men. One drink equals 12 oz of beer, 5 oz of wine, or 1 oz of hard liquor. Alcohol can affect any antidepressant medicines you are taking. Talk to your health care provider about your alcohol use.  Eat a healthy diet and get plenty of sleep.  Find activities that you enjoy doing, and make time to do them.  Consider joining a support group. Your health care provider may be able to recommend a support group.  Keep all follow-up visits as told by your health care provider. This is important. Where to find more information: [employer] on Mental Illness  www.nami.org [place] [employer] of Mental Health  http://www.maynard.net/ National Suicide Prevention Lifeline  1-800-273-TALK 319-165-2408). This is free, 24-hour help. Contact a health care provider if:  Your symptoms get worse.  You develop new symptoms. Get help right away if:  You self-harm.  You have serious thoughts about hurting yourself or others.  You see, hear, taste, smell, or feel things that are not present (hallucinate). This information is not intended to replace advice given to you by your health care provider. Make sure you discuss any questions you have  with your health care provider. Document Released: 05/03/2012 Document Revised: 09/13/2015 Document Reviewed: 07/18/2015 Elsevier Interactive Patient Education  2017 [employer]

## 2016-06-05 NOTE — Progress Notes (Signed)
Subjective:    Jamie Lutz is a 21 y.o. [id number] Hispanic female who presents for a postpartum visit. She is 5 weeks postpartum following a spontaneous vaginal delivery at 39.2 gestational weeks. Anesthesia: none. I have fully reviewed the prenatal and intrapartum course. Postpartum course has been complicated by urinary frequency and not feeling like she completely empties bladder since about 2wks pp. Some gas pains, was constipated/hurt to have bm, getting better now. Baby's course has been uncomplicated. Baby is feeding by breast. Bleeding no bleeding. Bowel function is some occ constipation. Bladder function is abnormal: see above. Patient is not sexually active. Last sexual activity: prior to birth of baby. Contraception method is condoms. Postpartum depression screening: positive. Score 13.  Has h/o depression/anxiety, was on prozac before and some during pregnancy- doesn't want to take w/ breastfeeding. Denies SI/HI/II. Is interested in counseling. Last pap 11/15/15 and was neg.  The following portions of the patient's history were reviewed and updated as appropriate: allergies, current medications, past medical history, past surgical history and problem list.  Review of Systems Pertinent items are noted in HPI.   Vitals:   06/05/16 1030  BP: 98/60  Pulse: 75  Weight: 161 lb (73 kg)  Height: 5\' 4"  (1.626 m)   Patient's last menstrual period was 05/23/2016 (approximate).  Objective:   General:  alert, cooperative and no distress   Breasts:  deferred, no complaints  Lungs: clear to auscultation bilaterally  Heart:  regular rate and rhythm  Abdomen: soft, nontender   Vulva: normal  Vagina: normal vagina  Cervix:  closed  Corpus: Well-involuted  Adnexa:  Non-palpable  Rectal Exam: No hemorrhoids        Results for orders placed or performed in visit on 06/05/16 (from the past 24 hour(s))  POCT Urinalysis Dipstick     Status: None   Collection Time: 06/05/16 10:35 AM   Result Value Ref Range   Color, UA     Clarity, UA     Glucose, UA neg    Bilirubin, UA     Ketones, UA neg    Spec Grav, UA  1.010 - 1.025   Blood, UA neg    pH, UA  5.0 - 8.0   Protein, UA neg    Urobilinogen, UA  0.2 or 1.0 E.U./dL   Nitrite, UA neg    Leukocytes, UA Negative Negative    Assessment:   Postpartum exam 5 wks s/p SVB Breastfeeding A1DM during pregnancy Urinary frequency Depression screening Depression/anxiety Contraception counseling   Plan:  Contraception: condoms  Send urine cx Rx Lexapro 10mg  daily, understands can take a few weeks to notice improvement Referral to [facility] sent, let us know if doesn't hear from them within 1wk Follow up in: 4 weeks for f/u on Lexapro and pp 2hr gtt, or earlier if needed  Marge DuncansBooker, Kimberly Randall CNM, [facility]NP-BC 06/05/2016 10:45 AM

## 2016-06-07 LAB — URINE CULTURE: Organism ID, Bacteria: NO GROWTH

## 2016-06-09 ENCOUNTER — Telehealth: Payer: Self-pay | Admitting: Obstetrics & Gynecology

## 2016-06-12 NOTE — Telephone Encounter (Signed)
Pt advised that [facility] would not be able to see her until July. [facility] and that if she did not hear from them in a week to call us and let us know. Pt verbalized understanding.

## 2016-07-03 ENCOUNTER — Other Ambulatory Visit: Payer: Self-pay

## 2016-07-03 ENCOUNTER — Ambulatory Visit: Payer: Self-pay | Admitting: Women's Health

## 2016-07-11 ENCOUNTER — Encounter: Payer: Self-pay | Admitting: Obstetrics & Gynecology

## 2016-07-11 ENCOUNTER — Ambulatory Visit (INDEPENDENT_AMBULATORY_CARE_PROVIDER_SITE_OTHER): Payer: Self-pay | Admitting: Obstetrics & Gynecology

## 2016-07-11 VITALS — BP 102/64 | HR 64 | Wt 160.0 lb

## 2016-07-11 DIAGNOSIS — F53 Postpartum depression: Secondary | ICD-10-CM

## 2016-07-11 DIAGNOSIS — O99345 Other mental disorders complicating the puerperium: Principal | ICD-10-CM

## 2016-07-11 MED ORDER — FLUOXETINE HCL 20 MG PO CAPS: 20.0000 mg | ORAL_CAPSULE | Freq: Every day | ORAL | 3 refills | Status: DC

## 2016-07-11 MED ORDER — ALPRAZOLAM 0.5 MG PO TABS: 0.5000 mg | ORAL_TABLET | Freq: Three times a day (TID) | ORAL | 0 refills | Status: DC | PRN

## 2016-07-11 NOTE — Progress Notes (Signed)
Chief Complaint  Patient presents with  . Anxiety    Blood pressure 102/64, pulse 64, weight 160 lb (72.6 kg), last menstrual period 06/02/2016, currently breastfeeding.  21 y.o. [id number] Patient's last menstrual period was 06/02/2016. The current method of family planning is none.  Outpatient Encounter Prescriptions as of 07/11/2016  Medication Sig  . Prenatal Multivit-Min-Fe-FA (PRENATAL VITAMINS) 0.8 MG tablet Take 1 tablet by mouth daily.  Marland Kitchen ALPRAZolam (XANAX) 0.5 MG tablet Take 1 tablet (0.5 mg total) by mouth 3 (three) times daily as needed for anxiety.  Marland Kitchen FLUoxetine (PROZAC) 20 MG capsule Take 1 capsule (20 mg total) by mouth daily.  . [DISCONTINUED] escitalopram (LEXAPRO) 10 MG tablet Take 1 tablet (10 mg total) by mouth daily. (Patient not taking: Reported on 07/11/2016)   No facility-administered encounter medications on file as of 07/11/2016.     Subjective Patient is in to talk about time her Lexapro therapy for postpartum depression She states that is working for her anxiety symptoms but it really makes her feel pretty bad Like heaviness in her head and makes her sleepy and sluggish and actually made some her depression symptoms worse She states she has been on Prozac in the past for forced the last pregnancy and that worked well for her so we will switch that today He also has a family event coming up and wants to have something just in case for anxiety  Objective   Pertinent ROS She has had fleeting suicidal thought but no plan and no thoughts of hurting the baby or other curettes  Labs or studies     Impression Diagnoses this Encounter::   ICD-10-CM   1. Postpartum depression F53     Established relevant diagnosis(es):   Plan/Recommendations: Meds ordered this encounter  Medications  . ALPRAZolam (XANAX) 0.5 MG tablet    Sig: Take 1 tablet (0.5 mg total) by mouth 3 (three) times daily as needed for anxiety.    Dispense:  10 tablet    Refill:   0  . FLUoxetine (PROZAC) 20 MG capsule    Sig: Take 1 capsule (20 mg total) by mouth daily.    Dispense:  30 capsule    Refill:  3    Labs or Scans Ordered: No orders of the defined types were placed in this encounter.   Management:: Switch Byrd Hesselbach 2 Prozac 20 mg can increase her to 40 mg if needed but will start here This is a dose that she was on previously according to records I'll see her back in 1 month for follow-up She is instructed if she has any thoughts of suicidal plan or thoughts of hurting any the kids to please present to the emergency department for evaluation  Follow up Return in about 1 month (around 08/10/2016) for Follow up, with Dr Despina Hidden.        Face to face time:  15 minutes  Greater than 50% of the visit time was spent in counseling and coordination of care with the patient.  The summary and outline of the counseling and care coordination is summarized in the note above.   All questions were answered.  Past Medical History:  Diagnosis Date  . Anxiety   . Diabetes mellitus without complication (HCC)   . Gestational diabetes   . No pertinent past medical history     Past Surgical History:  Procedure Laterality Date  . NO PAST SURGERIES    . PERINEAL LACERATION REPAIR  04/17/2011  Procedure: SUTURE REPAIR PERINEAL LACERATION;  Surgeon: Tilda BurrowJohn V Ferguson, MD;  Location: [facility];  Service: Gynecology;  Laterality: N/A;    OB History    Gravida Para Term Preterm AB Living   3 3 3  0 0 3   SAB TAB Ectopic Multiple Live Births   0 0 0 0 3      No Known Allergies  Social History   Social History  . Marital status: Married    Spouse name: N/A  . Number of children: 2  . Years of education: N/A   Social History Main Topics  . Smoking status: Never Smoker  . Smokeless tobacco: Never Used  . Alcohol use No  . Drug use: No  . Sexual activity: Not Currently    Birth control/ protection: None   Other Topics Concern  . None   Social History  Narrative  . None    Family History  Problem Relation Age of Onset  . Hypothyroidism Mother   . Diabetes Maternal Grandmother   . Cancer Maternal Grandfather        esophagus

## 2016-07-18 ENCOUNTER — Other Ambulatory Visit: Payer: Self-pay

## 2016-07-18 ENCOUNTER — Ambulatory Visit: Payer: Self-pay | Admitting: Women's Health

## 2016-08-11 ENCOUNTER — Ambulatory Visit: Payer: Self-pay | Admitting: Obstetrics & Gynecology

## 2016-12-21 ENCOUNTER — Emergency Department (HOSPITAL_COMMUNITY)
Admission: EM | Admit: 2016-12-21 | Discharge: 2016-12-21 | Disposition: A | Discharge status: Home / Self Care | Payer: Self-pay | Attending: Emergency Medicine | Admitting: Emergency Medicine

## 2016-12-21 ENCOUNTER — Encounter (HOSPITAL_COMMUNITY): Payer: Self-pay | Admitting: *Deleted

## 2016-12-21 ENCOUNTER — Emergency Department (HOSPITAL_COMMUNITY): Payer: Self-pay

## 2016-12-21 DIAGNOSIS — R1011 Right upper quadrant pain: Secondary | ICD-10-CM

## 2016-12-21 DIAGNOSIS — K2900 Acute gastritis without bleeding: Secondary | ICD-10-CM | POA: Insufficient documentation

## 2016-12-21 DIAGNOSIS — Z79899 Other long term (current) drug therapy: Secondary | ICD-10-CM | POA: Insufficient documentation

## 2016-12-21 DIAGNOSIS — E119 Type 2 diabetes mellitus without complications: Secondary | ICD-10-CM | POA: Insufficient documentation

## 2016-12-21 LAB — CBC
HEMATOCRIT: 36.9 % (ref 36.0–46.0)
Hemoglobin: 12 g/dL (ref 12.0–15.0)
MCH: 28.5 pg (ref 26.0–34.0)
MCHC: 32.5 g/dL (ref 30.0–36.0)
MCV: 87.6 fL (ref 78.0–100.0)
PLATELETS: 173 10*3/uL (ref 150–400)
RBC: 4.21 MIL/uL (ref 3.87–5.11)
RDW: 13.6 % (ref 11.5–15.5)
WBC: 6.7 10*3/uL (ref 4.0–10.5)

## 2016-12-21 LAB — COMPREHENSIVE METABOLIC PANEL
ALBUMIN: 3.6 g/dL (ref 3.5–5.0)
ALT: 12 U/L — AB (ref 14–54)
AST: 18 U/L (ref 15–41)
Alkaline Phosphatase: 70 U/L (ref 38–126)
Anion gap: 8 (ref 5–15)
BUN: 11 mg/dL (ref 6–20)
CHLORIDE: 106 mmol/L (ref 101–111)
CO2: 24 mmol/L (ref 22–32)
CREATININE: 0.63 mg/dL (ref 0.44–1.00)
Calcium: 8.6 mg/dL — ABNORMAL LOW (ref 8.9–10.3)
GFR calc Af Amer: >60 mL/min (ref >60)
GLUCOSE: 104 mg/dL — AB (ref 65–99)
POTASSIUM: 3.7 mmol/L (ref 3.5–5.1)
Sodium: 138 mmol/L (ref 135–145)
Total Bilirubin: 0.5 mg/dL (ref 0.3–1.2)
Total Protein: 6.3 g/dL — ABNORMAL LOW (ref 6.5–8.1)

## 2016-12-21 LAB — URINALYSIS, ROUTINE W REFLEX MICROSCOPIC
BILIRUBIN URINE: NEGATIVE
GLUCOSE, UA: NEGATIVE mg/dL
HGB URINE DIPSTICK: NEGATIVE
KETONES UR: NEGATIVE mg/dL
Leukocytes, UA: NEGATIVE
Nitrite: NEGATIVE
PH: 7 (ref 5.0–8.0)
PROTEIN: NEGATIVE mg/dL
Specific Gravity, Urine: 1.021 (ref 1.005–1.030)

## 2016-12-21 LAB — I-STAT BETA HCG BLOOD, ED (MC, WL, AP ONLY): I-stat hCG, quantitative: <5 m[IU]/mL (ref <5)

## 2016-12-21 LAB — LIPASE, BLOOD: Lipase: 24 U/L (ref 11–51)

## 2016-12-21 MED ORDER — GI COCKTAIL ~~LOC~~
30.0000 mL | Freq: Once | ORAL | Status: AC
  Administered 2016-12-21: 30 mL via ORAL
  Filled 2016-12-21: qty 30

## 2016-12-21 MED ORDER — PANTOPRAZOLE SODIUM 20 MG PO TBEC: 20.0000 mg | DELAYED_RELEASE_TABLET | Freq: Every day | ORAL | 0 refills | Status: DC

## 2016-12-21 MED ORDER — ONDANSETRON HCL 4 MG/2ML IJ SOLN
4.0000 mg | Freq: Once | INTRAMUSCULAR | Status: AC
  Administered 2016-12-21: 4 mg via INTRAVENOUS
  Filled 2016-12-21: qty 2

## 2016-12-21 MED ORDER — SODIUM CHLORIDE 0.9 % IV BOLUS (SEPSIS)
1000.0000 mL | Freq: Once | INTRAVENOUS | Status: AC
  Administered 2016-12-21: 1000 mL via INTRAVENOUS

## 2016-12-21 MED ORDER — MORPHINE SULFATE (PF) 4 MG/ML IV SOLN
4.0000 mg | Freq: Once | INTRAVENOUS | Status: AC
  Administered 2016-12-21: 4 mg via INTRAVENOUS
  Filled 2016-12-21: qty 1

## 2016-12-21 NOTE — ED Triage Notes (Signed)
Right "cramping" mid abdominal pain, body aches, headache, nausea since last week. Last took tylenol around1400

## 2016-12-21 NOTE — Discharge Instructions (Signed)
Please read and follow all provided instructions.  Your diagnoses today include:  1. Acute gastritis, presence of bleeding unspecified, unspecified gastritis type   2. RUQ pain     Tests performed today include: Blood counts and electrolytes Blood tests to check liver and kidney function Blood tests to check pancreas function Urine test to look for infection and pregnancy (in women) Vital signs. See below for your results today.   Medications prescribed:   Take any prescribed medications only as directed.  Home care instructions:  Follow any educational materials contained in this packet.  Follow-up instructions: Please follow-up with your primary care provider in the next 2 days for further evaluation of your symptoms.    Return instructions:  SEEK IMMEDIATE MEDICAL ATTENTION IF: The pain does not go away or becomes severe  A temperature above 101F develops  Repeated vomiting occurs (multiple episodes)  The pain becomes localized to portions of the abdomen. The right side could possibly be appendicitis. In an adult, the left lower portion of the abdomen could be colitis or diverticulitis.  Blood is being passed in stools or vomit (bright red or black tarry stools)  You develop chest pain, difficulty breathing, dizziness or fainting, or become confused, poorly responsive, or inconsolable (young children) If you have any other emergent concerns regarding your health  Additional Information: Abdominal (belly) pain can be caused by many things. Your caregiver performed an examination and possibly ordered blood/urine tests and imaging (CT scan, x-rays, ultrasound). Many cases can be observed and treated at home after initial evaluation in the emergency department. Even though you are being discharged home, abdominal pain can be unpredictable. Therefore, you need a repeated exam if your pain does not resolve, returns, or worsens. Most patients with abdominal pain don't have to be  admitted to the hospital or have surgery, but serious problems like appendicitis and gallbladder attacks can start out as nonspecific pain. Many abdominal conditions cannot be diagnosed in one visit, so follow-up evaluations are very important.  Your vital signs today were: BP 110/78    Pulse 72    Temp 98.1 F (36.7 C) (Oral)    Resp 18    Ht 5\' 4"  (1.626 m)    Wt 71.7 kg (158 lb)    LMP 12/14/2016    SpO2 100%    BMI 27.12 kg/m  If your blood pressure (bp) was elevated above 135/85 this visit, please have this repeated by your doctor within one month. --------------

## 2016-12-21 NOTE — ED Notes (Signed)
Patient transported to Ultrasound 

## 2016-12-21 NOTE — ED Provider Notes (Signed)
[facility] Note   CSN: [id number] Arrival date & time: 12/21/16  1440     History   Chief Complaint Chief Complaint  Patient presents with  . Abdominal Pain    HPI Jamie Lutz is a 21 y.o. female.  HPI  21 y.o. female with a hx of DM, presents to the Emergency Department today due to epigastric/RUQ abdominal pain over the past week. Rates 6/10. Cramping sensation. Notes intermittent pain that she describes as a sharp sensation initially and becomes dull. No N/V. No diarrhea. No CP/SOB. Notes worsening with PO intake. No hx abdominal surgeries. Notes normal BMs. No vaginal bleeding/discharge. No dysuria. No hx same. No fevers. No cough/congestion. No other symptoms noted.   Past Medical History:  Diagnosis Date  . Anxiety   . Diabetes mellitus without complication (HCC)   . Gestational diabetes   . No pertinent past medical history     Patient Active Problem List   Diagnosis Date Noted  . Depression with anxiety 06/05/2016  . Depression 03/10/2016  . History of gestational diabetes 02/25/2016  . Chest pain 08/28/2015  . Cephalalgia 06/09/2015  . Adjustment disorder with anxious mood 11/03/2014  . 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 Chronic abdominal pain 05/03/2014  . Constipation 05/03/2014  . Paresthesia 02/06/2014    Past Surgical History:  Procedure Laterality Date  . NO PAST SURGERIES    . PERINEAL LACERATION REPAIR  04/17/2011   Procedure: SUTURE REPAIR PERINEAL LACERATION;  Surgeon: Tilda BurrowJohn V Ferguson, MD;  Location: [facility];  Service: Gynecology;  Laterality: N/A;    OB History    Gravida Para Term Preterm AB Living   3 3 3  0 0 3   SAB TAB Ectopic Multiple Live Births   0 0 0 0 3       Home Medications    Prior to Admission medications   Medication Sig Start Date End Date Taking? Authorizing Provider  ALPRAZolam Prudy Feeler(XANAX) 0.5 MG tablet Take 1 tablet (0.5 mg total) by mouth 3 (three) times  daily as needed for anxiety. 07/11/16   Lazaro ArmsEure, Luther H, MD  FLUoxetine (PROZAC) 20 MG capsule Take 1 capsule (20 mg total) by mouth daily. 07/11/16   Lazaro ArmsEure, Luther H, MD  Prenatal Multivit-Min-Fe-FA (PRENATAL VITAMINS) 0.8 MG tablet Take 1 tablet by mouth daily. 08/28/15   Kalman JewelsMcQueen, Shannon, MD    Family History Family History  Problem Relation Age of Onset  . Hypothyroidism Mother   . Diabetes Maternal Grandmother   . Cancer Maternal Grandfather        esophagus    Social History Social History   Tobacco Use  . Smoking status: Never Smoker  . Smokeless tobacco: Never Used  Substance Use Topics  . Alcohol use: No    Alcohol/week: 0.0 oz  . Drug use: No     Allergies   Patient has no known allergies.   Review of Systems Review of Systems ROS reviewed and all are negative for acute change except as noted in the HPI.  Physical Exam Updated Vital Signs BP 111/65   Pulse 61   Temp 98.1 F (36.7 C) (Oral)   Resp 18   Ht 5\' 4"  (1.626 m)   Wt 71.7 kg (158 lb)   LMP 12/14/2016   SpO2 98%   BMI 27.12 kg/m   Physical Exam  Constitutional: She is oriented to person, place, and time. She appears well-developed and well-nourished. No distress.  HENT:  Head: Normocephalic and atraumatic.  Right Ear: Tympanic membrane, external ear and ear canal normal.  Left Ear: Tympanic membrane, external ear and ear canal normal.  Nose: Nose normal.  Mouth/Throat: Uvula is midline, oropharynx is clear and moist and mucous membranes are normal. No trismus in the jaw. No oropharyngeal exudate, posterior oropharyngeal erythema or tonsillar abscesses.  Eyes: EOM are normal. Pupils are equal, round, and reactive to light.  Neck: Normal range of motion. Neck supple. No tracheal deviation present.  Cardiovascular: Normal rate, regular rhythm, S1 normal, S2 normal, normal heart sounds, intact distal pulses and normal pulses.  Pulmonary/Chest: Effort normal and breath sounds normal. No respiratory  distress. She has no decreased breath sounds. She has no wheezes. She has no rhonchi. She has no rales.  Abdominal: Normal appearance and bowel sounds are normal. There is tenderness in the right upper quadrant and epigastric area. There is positive Murphy's sign. There is no rigidity, no rebound, no guarding, no CVA tenderness and no tenderness at McBurney's point.  Abdomen soft  Musculoskeletal: Normal range of motion.  Neurological: She is alert and oriented to person, place, and time.  Skin: Skin is warm and dry.  Psychiatric: She has a normal mood and affect. Her speech is normal and behavior is normal. Thought content normal.  Nursing note and vitals reviewed.  ED Treatments / Results  Labs (all labs ordered are listed, but only abnormal results are displayed) Labs Reviewed  COMPREHENSIVE METABOLIC PANEL - Abnormal; Notable for the following components:      Result Value   Glucose, Bld 104 (*)    Calcium 8.6 (*)    Total Protein 6.3 (*)    ALT 12 (*)    All other components within normal limits  LIPASE, BLOOD  CBC  URINALYSIS, ROUTINE W REFLEX MICROSCOPIC  I-STAT BETA HCG BLOOD, ED ([facility], [facility], [facility] ONLY)    EKG  EKG Interpretation None       Radiology No results found.  Procedures Procedures (including critical care time)  Medications Ordered in ED Medications  gi cocktail (Maalox,Lidocaine,Donnatal) (not administered)  sodium chloride 0.9 % bolus 1,000 mL (0 mLs Intravenous Stopped 12/21/16 2009)  morphine 4 MG/ML injection 4 mg (4 mg Intravenous Given 12/21/16 1822)  ondansetron (ZOFRAN) injection 4 mg (4 mg Intravenous Given 12/21/16 1822)     Initial Impression / Assessment and Plan / ED Course  I have reviewed the triage vital signs and the nursing notes.  Pertinent labs & imaging results that were available during my care of the patient were reviewed by me and considered in my medical decision making (see chart for details).  Final Clinical Impressions(s) / ED  Diagnoses  {I have reviewed and evaluated the relevant laboratory values. {I have reviewed and evaluated the relevant imaging studies.  {I have reviewed the relevant previous healthcare records.  {I obtained HPI from historian.   ED Course:  Assessment: Patient is a 21 y.o. female   with a hx of DM, presents to the Emergency Department today due to epigastric/RUQ abdominal pain over the past week. Rates 6/10. Cramping sensation. Notes intermittent pain that she describes as a sharp sensation initially and becomes dull. No N/V. No diarrhea. No CP/SOB. Notes worsening with PO intake. No hx abdominal surgeries. Notes normal BMs. No vaginal bleeding/discharge. No dysuria. No hx same. No fevers. No cough/congestion. On exam, nontoxic, nonseptic appearing, in no apparent distress. Patient's pain and other symptoms adequately managed in emergency department.  Fluid bolus given.  Labs, imaging and vitals reviewed.  Labs unremarkable. RUQ US unremarkable without evidence of cholecystitis. Patient does not meet the SIRS or Sepsis criteria.  On repeat exam patient does not have a surgical abdomen and there are no peritoneal signs.  No indication of appendicitis, bowel obstruction, bowel perforation, diverticulitis, PID or ectopic pregnancy. Given GI cocktail with improvement. Likely gastritis. Given Rx Protonix.. Patient discharged home with symptomatic treatment and given strict instructions for follow-up with their primary care physician.  I have also discussed reasons to return immediately to the ER.  Patient expresses understanding and agrees with plan  Disposition/Plan:  DC Home Additional Verbal discharge instructions given and discussed with patient.  Pt Instructed to f/u with PCP in the next week for evaluation and treatment of symptoms. Return precautions given Pt acknowledges and agrees with plan  Supervising Physician Tegeler, Canary Brimhristopher J, *  Final diagnoses:  RUQ pain  Acute gastritis, presence  of bleeding unspecified, unspecified gastritis type    ED Discharge Orders    None       Audry PiliMohr, Redmond Whittley, PA-C 12/21/16 2036    Tegeler, Canary Brimhristopher J, MD 12/21/16 [phone]

## 2017-01-29 ENCOUNTER — Other Ambulatory Visit: Payer: Self-pay | Admitting: Obstetrics & Gynecology

## 2017-07-30 ENCOUNTER — Ambulatory Visit: Payer: No Typology Code available for payment source | Admitting: Adult Health

## 2017-08-10 ENCOUNTER — Ambulatory Visit: Payer: No Typology Code available for payment source | Admitting: Adult Health

## 2017-09-28 ENCOUNTER — Telehealth: Payer: Self-pay | Admitting: Obstetrics & Gynecology

## 2017-10-13 ENCOUNTER — Other Ambulatory Visit: Payer: Self-pay | Admitting: Obstetrics and Gynecology

## 2017-10-13 DIAGNOSIS — Z363 Encounter for antenatal screening for malformations: Secondary | ICD-10-CM

## 2017-10-14 ENCOUNTER — Ambulatory Visit (INDEPENDENT_AMBULATORY_CARE_PROVIDER_SITE_OTHER): Payer: Self-pay

## 2017-10-14 DIAGNOSIS — Z363 Encounter for antenatal screening for malformations: Secondary | ICD-10-CM

## 2017-10-14 NOTE — Progress Notes (Signed)
[place] 18+1 wks,transverse head left,cx 4.3 cm,posterior pl gr 0,normal ovaries bilat,svp of fluid 4 cm,fhr 157 bpm,efw 225 g 44%,anatomy complete,no obvious abnormalities

## 2017-10-30 ENCOUNTER — Ambulatory Visit (INDEPENDENT_AMBULATORY_CARE_PROVIDER_SITE_OTHER): Payer: Self-pay | Admitting: *Deleted

## 2017-10-30 ENCOUNTER — Encounter: Payer: Self-pay | Admitting: Women's Health

## 2017-10-30 ENCOUNTER — Ambulatory Visit (INDEPENDENT_AMBULATORY_CARE_PROVIDER_SITE_OTHER): Payer: Self-pay | Admitting: Women's Health

## 2017-10-30 VITALS — BP 97/61 | HR 82 | Wt 164.0 lb

## 2017-10-30 DIAGNOSIS — Z331 Pregnant state, incidental: Secondary | ICD-10-CM

## 2017-10-30 DIAGNOSIS — Z1389 Encounter for screening for other disorder: Secondary | ICD-10-CM

## 2017-10-30 DIAGNOSIS — Z3490 Encounter for supervision of normal pregnancy, unspecified, unspecified trimester: Secondary | ICD-10-CM | POA: Insufficient documentation

## 2017-10-30 DIAGNOSIS — Z3A20 20 weeks gestation of pregnancy: Secondary | ICD-10-CM

## 2017-10-30 DIAGNOSIS — O9989 Other specified diseases and conditions complicating pregnancy, childbirth and the puerperium: Secondary | ICD-10-CM

## 2017-10-30 DIAGNOSIS — F418 Other specified anxiety disorders: Secondary | ICD-10-CM

## 2017-10-30 DIAGNOSIS — O0932 Supervision of pregnancy with insufficient antenatal care, second trimester: Secondary | ICD-10-CM | POA: Insufficient documentation

## 2017-10-30 DIAGNOSIS — Z3482 Encounter for supervision of other normal pregnancy, second trimester: Secondary | ICD-10-CM

## 2017-10-30 DIAGNOSIS — Z8632 Personal history of gestational diabetes: Secondary | ICD-10-CM

## 2017-10-30 LAB — POCT URINALYSIS DIPSTICK OB
Blood, UA: NEGATIVE
Glucose, UA: NEGATIVE
Ketones, UA: NEGATIVE
LEUKOCYTES UA: NEGATIVE
NITRITE UA: NEGATIVE

## 2017-10-30 NOTE — Progress Notes (Signed)
INITIAL OBSTETRICAL VISIT Patient name: Jamie Lutz MRN [id number]  Date of birth: Sep 07, 1995 Chief Complaint:   Initial Prenatal Visit  History of Present Illness:   Jamie Lutz is a 22 y.o. [phone] Hispanic female at [redacted]w[redacted]d by LMP c/w 18wk u/s, with an Estimated Date of Delivery: 03/16/18 being seen today for her initial obstetrical visit.   Her obstetrical history is significant for term SVB x 3, A1DM w/ last pregnancy.   Dep/anx, no meds currently, doing well Today she reports n/v just starting to improve.  Patient's last menstrual period was 06/09/2017 (exact date). Last pap 11/15/15. Results were: normal Review of Systems:   Pertinent items are noted in HPI Denies cramping/contractions, leakage of fluid, vaginal bleeding, abnormal vaginal discharge w/ itching/odor/irritation, headaches, visual changes, shortness of breath, chest pain, abdominal pain, severe nausea/vomiting, or problems with urination or bowel movements unless otherwise stated above.  Pertinent History Reviewed:  Reviewed past medical,surgical, social, obstetrical and family history.  Reviewed problem list, medications and allergies. OB History  Gravida Para Term Preterm AB Living  4 3 3  0 0 3  SAB TAB Ectopic Multiple Live Births  0 0 0 0 3    # Outcome Date GA Lbr Len/2nd Weight Sex Delivery Anes PTL Lv  4 Current           3 Term 04/23/16 [redacted]w[redacted]d 01:50 / 00:09 6 lb 11.8 oz (3.056 kg) M Vag-Spont None  LIV     Complications: Gestational diabetes  2 Term 06/11/12 [redacted]w[redacted]d 14:17 / 00:05 7 lb 0.6 oz (3.192 kg) M Vag-Spont None N LIV     Birth Comments: Born at 60 weeks to 22 y.o.  [id number]. No issue or complication around birth or delivery.   1 Term 04/16/11 [redacted]w[redacted]d 14:50 / 00:39 5 lb 7.7 oz (2.486 kg) M Vag-Spont Local N LIV   Physical Assessment:   Vitals:   10/30/17 1142  BP: 97/61  Pulse: 82  Weight: 164 lb (74.4 kg)  Body mass index is 28.15 kg/m.       Physical Examination:  General  appearance - well appearing, and in no distress  Mental status - alert, oriented to person, place, and time  Psych:  She has a normal mood and affect  Skin - warm and dry, normal color, no suspicious lesions noted  Chest - effort normal, all lung fields clear to auscultation bilaterally  Heart - normal rate and regular rhythm  Abdomen - soft, nontender  Extremities:  No swelling or varicosities noted  Thin prep pap is not done   Fetal Heart Rate (bpm): 145 via doppler  Results for orders placed or performed in visit on 10/30/17 (from the past 24 hour(s))  POC Urinalysis Dipstick OB   Collection Time: 10/30/17 11:54 AM  Result Value Ref Range   Color, UA     Clarity, UA     Glucose, UA Negative Negative   Bilirubin, UA     Ketones, UA neg    Spec Grav, UA     Blood, UA neg    pH, UA     POC Protein UA Small (1+) (A) Negative, Trace   Urobilinogen, UA     Nitrite, UA neg    Leukocytes, UA Negative Negative   Appearance     Odor      Assessment & Plan:  1) Low-Risk Pregnancy [id number] at [redacted]w[redacted]d with an Estimated Date of Delivery: 03/16/18   2) Initial OB visit  3) Late care  4) H/O A1DM> will get A1C  5) Dep/anx> no meds, doing well  Meds: No orders of the defined types were placed in this encounter.   Initial labs obtained Continue prenatal vitamins Reviewed n/v relief measures and warning s/s to report Reviewed recommended weight gain based on pre-gravid BMI Encouraged well-balanced diet Genetic Screening discussed Quad Screen: declined Cystic fibrosis screening discussed declined Ultrasound discussed; fetal survey: results reviewed CCNC completed>PCM not here  Follow-up: Return in about 4 weeks (around 11/27/2017) for LROB.   Orders Placed This Encounter  Procedures  . GC/Chlamydia Probe Amp  . Urine Culture  . Urinalysis, Routine w reflex microscopic  . Obstetric Panel, Including HIV  . Pain Management Screening Profile (10S)  . Hemoglobin A1c  . POC  Urinalysis Dipstick OB    Cheral Marker CNM, [facility] 10/30/2017 12:19 PM

## 2017-10-30 NOTE — Patient Instructions (Signed)
Jamie Lutz, I greatly value your feedback.  If you receive a survey following your visit with [place] today, we appreciate you taking the time to fill it out.  Thanks, [name], CNM, [facility]NP-BC   Second Trimester of Pregnancy The second trimester is from week 14 through week 27 (months 4 through 6). The second trimester is often a time when you feel your best. Your body has adjusted to being pregnant, and you begin to feel better physically. Usually, morning sickness has lessened or quit completely, you may have more energy, and you may have an increase in appetite. The second trimester is also a time when the fetus is growing rapidly. At the end of the sixth month, the fetus is about 9 inches long and weighs about 1 pounds. You will likely begin to feel the baby move (quickening) between 16 and 20 weeks of pregnancy. Body changes during your second trimester Your body continues to go through many changes during your second trimester. The changes vary from woman to woman.  Your weight will continue to increase. You will notice your lower abdomen bulging out.  You may begin to get stretch marks on your hips, abdomen, and breasts.  You may develop headaches that can be relieved by medicines. The medicines should be approved by your health care provider.  You may urinate more often because the fetus is pressing on your bladder.  You may develop or continue to have heartburn as a result of your pregnancy.  You may develop constipation because certain hormones are causing the muscles that push waste through your intestines to slow down.  You may develop hemorrhoids or swollen, bulging veins (varicose veins).  You may have back pain. This is caused by: ? Weight gain. ? Pregnancy hormones that are relaxing the joints in your pelvis. ? A shift in weight and the muscles that support your balance.  Your breasts will continue to grow and they will continue to become tender.  Your gums may  bleed and may be sensitive to brushing and flossing.  Dark spots or blotches (chloasma, mask of pregnancy) may develop on your face. This will likely fade after the baby is born.  A dark line from your belly button to the pubic area (linea nigra) may appear. This will likely fade after the baby is born.  You may have changes in your hair. These can include thickening of your hair, rapid growth, and changes in texture. Some women also have hair loss during or after pregnancy, or hair that feels dry or thin. Your hair will most likely return to normal after your baby is born.  What to expect at prenatal visits During a routine prenatal visit:  You will be weighed to make sure you and the fetus are growing normally.  Your blood pressure will be taken.  Your abdomen will be measured to track your baby's growth.  The fetal heartbeat will be listened to.  Any test results from the previous visit will be discussed.  Your health care provider may ask you:  How you are feeling.  If you are feeling the baby move.  If you have had any abnormal symptoms, such as leaking fluid, bleeding, severe headaches, or abdominal cramping.  If you are using any tobacco products, including cigarettes, chewing tobacco, and electronic cigarettes.  If you have any questions.  Other tests that may be performed during your second trimester include:  Blood tests that check for: ? Low iron levels (anemia). ? High  blood sugar that affects pregnant women (gestational diabetes) between 42 and 28 weeks. ? Rh antibodies. This is to check for a protein on red blood cells (Rh factor).  Urine tests to check for infections, diabetes, or protein in the urine.  An ultrasound to confirm the proper growth and development of the baby.  An amniocentesis to check for possible genetic problems.  Fetal screens for spina bifida and Down syndrome.  HIV (human immunodeficiency virus) testing. Routine prenatal testing  includes screening for HIV, unless you choose not to have this test.  Follow these instructions at home: Medicines  Follow your health care provider's instructions regarding medicine use. Specific medicines may be either safe or unsafe to take during pregnancy.  Take a prenatal vitamin that contains at least 600 micrograms (mcg) of folic acid.  If you develop constipation, try taking a stool softener if your health care provider approves. Eating and drinking  Eat a balanced diet that includes fresh fruits and vegetables, whole grains, good sources of protein such as meat, eggs, or tofu, and low-fat dairy. Your health care provider will help you determine the amount of weight gain that is right for you.  Avoid raw meat and uncooked cheese. These carry germs that can cause birth defects in the baby.  If you have low calcium intake from food, talk to your health care provider about whether you should take a daily calcium supplement.  Limit foods that are high in fat and processed sugars, such as fried and sweet foods.  To prevent constipation: ? Drink enough fluid to keep your urine clear or pale yellow. ? Eat foods that are high in fiber, such as fresh fruits and vegetables, whole grains, and beans. Activity  Exercise only as directed by your health care provider. Most women can continue their usual exercise routine during pregnancy. Try to exercise for 30 minutes at least 5 days a week. Stop exercising if you experience uterine contractions.  Avoid heavy lifting, wear low heel shoes, and practice good posture.  A sexual relationship may be continued unless your health care provider directs you otherwise. Relieving pain and discomfort  Wear a good support bra to prevent discomfort from breast tenderness.  Take warm sitz baths to soothe any pain or discomfort caused by hemorrhoids. Use hemorrhoid cream if your health care provider approves.  Rest with your legs elevated if you have  leg cramps or low back pain.  If you develop varicose veins, wear support hose. Elevate your feet for 15 minutes, 3-4 times a day. Limit salt in your diet. Prenatal Care  Write down your questions. Take them to your prenatal visits.  Keep all your prenatal visits as told by your health care provider. This is important. Safety  Wear your seat belt at all times when driving.  Make a list of emergency phone numbers, including numbers for family, friends, the hospital, and police and fire departments. General instructions  Ask your health care provider for a referral to a local prenatal education class. Begin classes no later than the beginning of month 6 of your pregnancy.  Ask for help if you have counseling or nutritional needs during pregnancy. Your health care provider can offer advice or refer you to specialists for help with various needs.  Do not use hot tubs, steam rooms, or saunas.  Do not douche or use tampons or scented sanitary pads.  Do not cross your legs for long periods of time.  Avoid cat litter boxes and soil  used by cats. These carry germs that can cause birth defects in the baby and possibly loss of the fetus by miscarriage or stillbirth.  Avoid all smoking, herbs, alcohol, and unprescribed drugs. Chemicals in these products can affect the formation and growth of the baby.  Do not use any products that contain nicotine or tobacco, such as cigarettes and e-cigarettes. If you need help quitting, ask your health care provider.  Visit your dentist if you have not gone yet during your pregnancy. Use a soft toothbrush to brush your teeth and be gentle when you floss. Contact a health care provider if:  You have dizziness.  You have mild pelvic cramps, pelvic pressure, or nagging pain in the abdominal area.  You have persistent nausea, vomiting, or diarrhea.  You have a bad smelling vaginal discharge.  You have pain when you urinate. Get help right away if:  You  have a fever.  You are leaking fluid from your vagina.  You have spotting or bleeding from your vagina.  You have severe abdominal cramping or pain.  You have rapid weight gain or weight loss.  You have shortness of breath with chest pain.  You notice sudden or extreme swelling of your face, hands, ankles, feet, or legs.  You have not felt your baby move in over an hour.  You have severe headaches that do not go away when you take medicine.  You have vision changes. Summary  The second trimester is from week 14 through week 27 (months 4 through 6). It is also a time when the fetus is growing rapidly.  Your body goes through many changes during pregnancy. The changes vary from woman to woman.  Avoid all smoking, herbs, alcohol, and unprescribed drugs. These chemicals affect the formation and growth your baby.  Do not use any tobacco products, such as cigarettes, chewing tobacco, and e-cigarettes. If you need help quitting, ask your health care provider.  Contact your health care provider if you have any questions. Keep all prenatal visits as told by your health care provider. This is important. This information is not intended to replace advice given to you by your health care provider. Make sure you discuss any questions you have with your health care provider. Document Released: 12/31/2000 Document Revised: 06/14/2015 Document Reviewed: 03/09/2012 Elsevier Interactive Patient Education  2017 [employer].

## 2017-10-31 LAB — URINALYSIS, ROUTINE W REFLEX MICROSCOPIC
Bilirubin, UA: NEGATIVE
GLUCOSE, UA: NEGATIVE
Ketones, UA: NEGATIVE
NITRITE UA: NEGATIVE
PH UA: 6.5 (ref 5.0–7.5)
RBC, UA: NEGATIVE
Specific Gravity, UA: 1.026 (ref 1.005–1.030)
UUROB: 0.2 mg/dL (ref 0.2–1.0)

## 2017-10-31 LAB — OBSTETRIC PANEL, INCLUDING HIV
Antibody Screen: NEGATIVE
BASOS: 1 %
Basophils Absolute: 0.1 10*3/uL (ref 0.0–0.2)
EOS (ABSOLUTE): 0.5 10*3/uL — ABNORMAL HIGH (ref 0.0–0.4)
EOS: 4 %
HEMATOCRIT: 37.9 % (ref 34.0–46.6)
HEMOGLOBIN: 12.7 g/dL (ref 11.1–15.9)
HEP B S AG: NEGATIVE
HIV Screen 4th Generation wRfx: NONREACTIVE
IMMATURE GRANULOCYTES: 2 %
Immature Grans (Abs): 0.2 10*3/uL — ABNORMAL HIGH (ref 0.0–0.1)
LYMPHS ABS: 1.6 10*3/uL (ref 0.7–3.1)
Lymphs: 14 %
MCH: 28.8 pg (ref 26.6–33.0)
MCHC: 33.5 g/dL (ref 31.5–35.7)
MCV: 86 fL (ref 79–97)
Monocytes Absolute: 0.7 10*3/uL (ref 0.1–0.9)
Monocytes: 6 %
NEUTROS PCT: 73 %
Neutrophils Absolute: 8.5 10*3/uL — ABNORMAL HIGH (ref 1.4–7.0)
Platelets: 184 10*3/uL (ref 150–450)
RBC: 4.41 x10E6/uL (ref 3.77–5.28)
RDW: 13.4 % (ref 12.3–15.4)
RH TYPE: POSITIVE
RPR: NONREACTIVE
Rubella Antibodies, IGG: 1.03 index (ref >0.99)
WBC: 11.5 10*3/uL — AB (ref 3.4–10.8)

## 2017-10-31 LAB — MICROSCOPIC EXAMINATION
Casts: NONE SEEN /lpf
RBC, UA: NONE SEEN /hpf (ref 0–2)

## 2017-10-31 LAB — HEMOGLOBIN A1C
ESTIMATED AVERAGE GLUCOSE: 103 mg/dL
HEMOGLOBIN A1C: 5.2 % (ref 4.8–5.6)

## 2017-11-01 LAB — URINE CULTURE

## 2017-11-27 ENCOUNTER — Other Ambulatory Visit: Payer: Self-pay | Admitting: Women's Health

## 2017-11-27 ENCOUNTER — Encounter: Payer: Self-pay | Admitting: Women's Health

## 2017-11-27 ENCOUNTER — Other Ambulatory Visit: Payer: Self-pay

## 2017-11-27 ENCOUNTER — Ambulatory Visit (INDEPENDENT_AMBULATORY_CARE_PROVIDER_SITE_OTHER): Payer: Self-pay | Admitting: Women's Health

## 2017-11-27 VITALS — BP 112/67 | HR 92 | Wt 168.0 lb

## 2017-11-27 DIAGNOSIS — Z331 Pregnant state, incidental: Secondary | ICD-10-CM

## 2017-11-27 DIAGNOSIS — Z3482 Encounter for supervision of other normal pregnancy, second trimester: Secondary | ICD-10-CM

## 2017-11-27 DIAGNOSIS — Z1389 Encounter for screening for other disorder: Secondary | ICD-10-CM

## 2017-11-27 DIAGNOSIS — Z3A24 24 weeks gestation of pregnancy: Secondary | ICD-10-CM

## 2017-11-27 LAB — POCT URINALYSIS DIPSTICK OB
Blood, UA: NEGATIVE
GLUCOSE, UA: NEGATIVE
KETONES UA: NEGATIVE
Leukocytes, UA: NEGATIVE
Nitrite, UA: NEGATIVE
POC,PROTEIN,UA: NEGATIVE

## 2017-11-27 NOTE — Patient Instructions (Signed)
Jamie Lutz, I greatly value your feedback.  If you receive a survey following your visit with [place] today, we appreciate you taking the time to fill it out.  Thanks, Jamie Lutz, CNM, WHNP-BC   You will have your sugar test next visit.  Please do not eat or drink anything after midnight the night before you come, not even water.  You will be here for at least two hours.     Call the office [phone]) or go to [facility] if:  You begin to have strong, frequent contractions  Your water breaks.  Sometimes it is a big gush of fluid, sometimes it is just a trickle that keeps getting your panties wet or running down your legs  You have vaginal bleeding.  It is normal to have a small amount of spotting if your cervix was checked.   You don't feel your baby moving like normal.  If you don't, get you something to eat and drink and lay down and focus on feeling your baby move.   If your baby is still not moving like normal, you should call the office or go to [facility].  Second Trimester of Pregnancy The second trimester is from week 13 through week 28, months 4 through 6. The second trimester is often a time when you feel your best. Your body has also adjusted to being pregnant, and you begin to feel better physically. Usually, morning sickness has lessened or quit completely, you may have more energy, and you may have an increase in appetite. The second trimester is also a time when the fetus is growing rapidly. At the end of the sixth month, the fetus is about 9 inches long and weighs about 1 pounds. You will likely begin to feel the baby move (quickening) between 18 and 20 weeks of the pregnancy. BODY CHANGES Your body goes through many changes during pregnancy. The changes vary from woman to woman.   Your weight will continue to increase. You will notice your lower abdomen bulging out.  You may begin to get stretch marks on your hips, abdomen, and breasts.  You may develop  headaches that can be relieved by medicines approved by your health care provider.  You may urinate more often because the fetus is pressing on your bladder.  You may develop or continue to have heartburn as a result of your pregnancy.  You may develop constipation because certain hormones are causing the muscles that push waste through your intestines to slow down.  You may develop hemorrhoids or swollen, bulging veins (varicose veins).  You may have back pain because of the weight gain and pregnancy hormones relaxing your joints between the bones in your pelvis and as a result of a shift in weight and the muscles that support your balance.  Your breasts will continue to grow and be tender.  Your gums may bleed and may be sensitive to brushing and flossing.  Dark spots or blotches (chloasma, mask of pregnancy) may develop on your face. This will likely fade after the baby is born.  A dark line from your belly button to the pubic area (linea nigra) may appear. This will likely fade after the baby is born.  You may have changes in your hair. These can include thickening of your hair, rapid growth, and changes in texture. Some women also have hair loss during or after pregnancy, or hair that feels dry or thin. Your hair will most likely return to normal after your  baby is born. WHAT TO EXPECT AT YOUR PRENATAL VISITS During a routine prenatal visit:  You will be weighed to make sure you and the fetus are growing normally.  Your blood pressure will be taken.  Your abdomen will be measured to track your baby's growth.  The fetal heartbeat will be listened to.  Any test results from the previous visit will be discussed. Your health care provider may ask you:  How you are feeling.  If you are feeling the baby move.  If you have had any abnormal symptoms, such as leaking fluid, bleeding, severe headaches, or abdominal cramping.  If you have any questions. Other tests that may be  performed during your second trimester include:  Blood tests that check for:  Low iron levels (anemia).  Gestational diabetes (between 24 and 28 weeks).  Rh antibodies.  Urine tests to check for infections, diabetes, or protein in the urine.  An ultrasound to confirm the proper growth and development of the baby.  An amniocentesis to check for possible genetic problems.  Fetal screens for spina bifida and Down syndrome. HOME CARE INSTRUCTIONS   Avoid all smoking, herbs, alcohol, and unprescribed drugs. These chemicals affect the formation and growth of the baby.  Follow your health care provider's instructions regarding medicine use. There are medicines that are either safe or unsafe to take during pregnancy.  Exercise only as directed by your health care provider. Experiencing uterine cramps is a good sign to stop exercising.  Continue to eat regular, healthy meals.  Wear a good support bra for breast tenderness.  Do not use hot tubs, steam rooms, or saunas.  Wear your seat belt at all times when driving.  Avoid raw meat, uncooked cheese, cat litter boxes, and soil used by cats. These carry germs that can cause birth defects in the baby.  Take your prenatal vitamins.  Try taking a stool softener (if your health care provider approves) if you develop constipation. Eat more high-fiber foods, such as fresh vegetables or fruit and whole grains. Drink plenty of fluids to keep your urine clear or pale yellow.  Take warm sitz baths to soothe any pain or discomfort caused by hemorrhoids. Use hemorrhoid cream if your health care provider approves.  If you develop varicose veins, wear support hose. Elevate your feet for 15 minutes, 3-4 times a day. Limit salt in your diet.  Avoid heavy lifting, wear low heel shoes, and practice good posture.  Rest with your legs elevated if you have leg cramps or low back pain.  Visit your dentist if you have not gone yet during your pregnancy.  Use a soft toothbrush to brush your teeth and be gentle when you floss.  A sexual relationship may be continued unless your health care provider directs you otherwise.  Continue to go to all your prenatal visits as directed by your health care provider. SEEK MEDICAL CARE IF:   You have dizziness.  You have mild pelvic cramps, pelvic pressure, or nagging pain in the abdominal area.  You have persistent nausea, vomiting, or diarrhea.  You have a bad smelling vaginal discharge.  You have pain with urination. SEEK IMMEDIATE MEDICAL CARE IF:   You have a fever.  You are leaking fluid from your vagina.  You have spotting or bleeding from your vagina.  You have severe abdominal cramping or pain.  You have rapid weight gain or loss.  You have shortness of breath with chest pain.  You notice sudden or extreme   swelling of your face, hands, ankles, feet, or legs.  You have not felt your baby move in over an hour.  You have severe headaches that do not go away with medicine.  You have vision changes. Document Released: 12/31/2000 Document Revised: 01/11/2013 Document Reviewed: 03/09/2012 [facility] Patient Information 2015 [place], [place]. This information is not intended to replace advice given to you by your health care provider. Make sure you discuss any questions you have with your health care provider.

## 2017-11-27 NOTE — Progress Notes (Signed)
   LOW-RISK PREGNANCY VISIT Patient name: Jamie Lutz MRN [id number]  Date of birth: 07/06/1995 Chief Complaint:   Routine Prenatal Visit  History of Present Illness:   Jamie Lutz is a 22 y.o. [phone] female at [redacted]w[redacted]d with an Estimated Date of Delivery: 03/16/18 being seen today for ongoing management of a low-risk pregnancy.  Today she reports some occ cramps, some stress, does not feel she needs meds for dep/anx right now, doing well overall. Denies SI.Marland Kitchen Contractions: Irritability. Vag. Bleeding: None.  Movement: Present. denies leaking of fluid. Review of Systems:   Pertinent items are noted in HPI Denies abnormal vaginal discharge w/ itching/odor/irritation, headaches, visual changes, shortness of breath, chest pain, abdominal pain, severe nausea/vomiting, or problems with urination or bowel movements unless otherwise stated above. Pertinent History Reviewed:  Reviewed past medical,surgical, social, obstetrical and family history.  Reviewed problem list, medications and allergies. Physical Assessment:   Vitals:   11/27/17 1218  BP: 112/67  Pulse: 92  Weight: 168 lb (76.2 kg)  Body mass index is 28.84 kg/m.        Physical Examination:   General appearance: Well appearing, and in no distress  Mental status: Alert, oriented to person, place, and time  Skin: Warm & dry  Cardiovascular: Normal heart rate noted  Respiratory: Normal respiratory effort, no distress  Abdomen: Soft, gravid, nontender  Pelvic: Cervical exam deferred         Extremities: Edema: None  Fetal Status: Fetal Heart Rate (bpm): 142 Fundal Height: 25 cm Movement: Present    Results for orders placed or performed in visit on 11/27/17 (from the past 24 hour(s))  POC Urinalysis Dipstick OB   Collection Time: 11/27/17 12:17 PM  Result Value Ref Range   Color, UA     Clarity, UA     Glucose, UA Negative Negative   Bilirubin, UA     Ketones, UA neg    Spec Grav, UA     Blood, UA neg    pH, UA      POC,PROTEIN,UA Negative Negative, Trace   Urobilinogen, UA     Nitrite, UA neg    Leukocytes, UA Negative Negative   Appearance     Odor      Assessment & Plan:  1) Low-risk pregnancy G4P3003 at [redacted]w[redacted]d with an Estimated Date of Delivery: 03/16/18   2) Dep/anx, doing well, let us know if decides needs meds   Meds: No orders of the defined types were placed in this encounter.  Labs/procedures today: none. To get flu shot at HD d/t self pay  Plan:  Continue routine obstetrical care   Reviewed: Preterm labor symptoms and general obstetric precautions including but not limited to vaginal bleeding, contractions, leaking of fluid and fetal movement were reviewed in detail with the patient.  All questions were answered  Follow-up: Return in about 4 weeks (around 12/25/2017) for LROB, PN2.  Orders Placed This Encounter  Procedures  . POC Urinalysis Dipstick OB   Cheral Marker CNM, [facility] 11/27/2017 12:29 PM

## 2017-11-28 LAB — MED LIST OPTION NOT SELECTED

## 2017-11-29 LAB — GC/CHLAMYDIA PROBE AMP
CHLAMYDIA, DNA PROBE: NEGATIVE
NEISSERIA GONORRHOEAE BY PCR: NEGATIVE

## 2017-11-30 LAB — PMP SCREEN PROFILE (10S), URINE
Amphetamine Scrn, Ur: NEGATIVE ng/mL
BARBITURATE SCREEN URINE: NEGATIVE ng/mL
BENZODIAZEPINE SCREEN, URINE: NEGATIVE ng/mL
CANNABINOIDS UR QL SCN: NEGATIVE ng/mL
Cocaine (Metab) Scrn, Ur: NEGATIVE ng/mL
Creatinine(Crt), U: 144.6 mg/dL (ref 20.0–300.0)
Methadone Screen, Urine: NEGATIVE ng/mL
OXYCODONE+OXYMORPHONE UR QL SCN: NEGATIVE ng/mL
Opiate Scrn, Ur: NEGATIVE ng/mL
Ph of Urine: 6.4 (ref 4.5–8.9)
Phencyclidine Qn, Ur: NEGATIVE ng/mL
Propoxyphene Scrn, Ur: NEGATIVE ng/mL

## 2017-12-25 ENCOUNTER — Other Ambulatory Visit: Payer: Self-pay

## 2017-12-25 ENCOUNTER — Encounter: Payer: Self-pay | Admitting: Obstetrics & Gynecology

## 2017-12-25 ENCOUNTER — Ambulatory Visit (INDEPENDENT_AMBULATORY_CARE_PROVIDER_SITE_OTHER): Payer: Self-pay | Admitting: Obstetrics & Gynecology

## 2017-12-25 VITALS — BP 108/69 | HR 90 | Wt 175.0 lb

## 2017-12-25 DIAGNOSIS — Z3483 Encounter for supervision of other normal pregnancy, third trimester: Secondary | ICD-10-CM

## 2017-12-25 DIAGNOSIS — Z3A28 28 weeks gestation of pregnancy: Secondary | ICD-10-CM

## 2017-12-25 DIAGNOSIS — Z331 Pregnant state, incidental: Secondary | ICD-10-CM

## 2017-12-25 DIAGNOSIS — Z3403 Encounter for supervision of normal first pregnancy, third trimester: Secondary | ICD-10-CM

## 2017-12-25 DIAGNOSIS — Z1389 Encounter for screening for other disorder: Secondary | ICD-10-CM

## 2017-12-25 LAB — POCT URINALYSIS DIPSTICK OB
Glucose, UA: NEGATIVE
KETONES UA: NEGATIVE
Leukocytes, UA: NEGATIVE
Nitrite, UA: NEGATIVE
POC,PROTEIN,UA: NEGATIVE
RBC UA: NEGATIVE

## 2017-12-25 NOTE — Progress Notes (Signed)
   LOW-RISK PREGNANCY VISIT Patient name: Jamie Lutz MRN [id number]  Date of birth: 12/09/1995 Chief Complaint:   Routine Prenatal Visit (PN2 today)  History of Present Illness:   Jamie Lutz is a 22 y.o. [phone] female at [redacted]w[redacted]d with an Estimated Date of Delivery: 03/16/18 being seen today for ongoing management of a low-risk pregnancy.  Today she reports no complaints. Contractions: Irregular. Vag. Bleeding: None.  Movement: Present. denies leaking of fluid. Review of Systems:   Pertinent items are noted in HPI Denies abnormal vaginal discharge w/ itching/odor/irritation, headaches, visual changes, shortness of breath, chest pain, abdominal pain, severe nausea/vomiting, or problems with urination or bowel movements unless otherwise stated above. Pertinent History Reviewed:  Reviewed past medical,surgical, social, obstetrical and family history.  Reviewed problem list, medications and allergies. Physical Assessment:   Vitals:   12/25/17 0906  BP: 108/69  Pulse: 90  Weight: 175 lb (79.4 kg)  Body mass index is 30.04 kg/m.        Physical Examination:   General appearance: Well appearing, and in no distress  Mental status: Alert, oriented to person, place, and time  Skin: Warm & dry  Cardiovascular: Normal heart rate noted  Respiratory: Normal respiratory effort, no distress  Abdomen: Soft, gravid, nontender  Pelvic: Cervical exam deferred         Extremities: Edema: None  Fetal Status: Fetal Heart Rate (bpm): 148 Fundal Height: 30 cm Movement: Present    Results for orders placed or performed in visit on 12/25/17 (from the past 24 hour(s))  POC Urinalysis Dipstick OB   Collection Time: 12/25/17  9:05 AM  Result Value Ref Range   Color, UA     Clarity, UA     Glucose, UA Negative Negative   Bilirubin, UA     Ketones, UA neg    Spec Grav, UA     Blood, UA neg    pH, UA     POC,PROTEIN,UA Negative Negative, Trace, Small (1+), Moderate (2+), Large (3+), 4+   Urobilinogen, UA     Nitrite, UA neg    Leukocytes, UA Negative Negative   Appearance     Odor      Assessment & Plan:  1) Low-risk pregnancy G4P3003 at [redacted]w[redacted]d with an Estimated Date of Delivery: 03/16/18   2) Hx of GDM,    Meds: No orders of the defined types were placed in this encounter.  Labs/procedures today: PN2 today  Plan:  Continue routine obstetrical care   Reviewed: Preterm labor symptoms and general obstetric precautions including but not limited to vaginal bleeding, contractions, leaking of fluid and fetal movement were reviewed in detail with the patient.  All questions were answered  Follow-up: Return in about 3 weeks (around 01/15/2018) for LROB.  Orders Placed This Encounter  Procedures  . POC Urinalysis Dipstick OB   Lazaro ArmsLuther H Eure  12/25/2017 9:27 AM

## 2017-12-26 LAB — GLUCOSE TOLERANCE, 2 HOURS W/ 1HR
GLUCOSE, FASTING: 79 mg/dL (ref 65–91)
Glucose, 1 hour: 162 mg/dL (ref 65–179)
Glucose, 2 hour: 129 mg/dL (ref 65–152)

## 2017-12-26 LAB — CBC
Hematocrit: 33.5 % — ABNORMAL LOW (ref 34.0–46.6)
Hemoglobin: 11.3 g/dL (ref 11.1–15.9)
MCH: 27.4 pg (ref 26.6–33.0)
MCHC: 33.7 g/dL (ref 31.5–35.7)
MCV: 81 fL (ref 79–97)
PLATELETS: 177 10*3/uL (ref 150–450)
RBC: 4.12 x10E6/uL (ref 3.77–5.28)
RDW: 13.2 % (ref 12.3–15.4)
WBC: 10.7 10*3/uL (ref 3.4–10.8)

## 2017-12-26 LAB — ANTIBODY SCREEN: Antibody Screen: NEGATIVE

## 2017-12-26 LAB — RPR: RPR: NONREACTIVE

## 2017-12-26 LAB — HIV ANTIBODY (ROUTINE TESTING W REFLEX): HIV Screen 4th Generation wRfx: NONREACTIVE

## 2018-01-18 ENCOUNTER — Ambulatory Visit (INDEPENDENT_AMBULATORY_CARE_PROVIDER_SITE_OTHER): Payer: Self-pay | Admitting: Obstetrics & Gynecology

## 2018-01-18 VITALS — BP 104/69 | HR 82 | Wt 175.0 lb

## 2018-01-18 DIAGNOSIS — Z3483 Encounter for supervision of other normal pregnancy, third trimester: Secondary | ICD-10-CM

## 2018-01-18 DIAGNOSIS — Z3A31 31 weeks gestation of pregnancy: Secondary | ICD-10-CM

## 2018-01-18 NOTE — Progress Notes (Signed)
   LOW-RISK PREGNANCY VISIT Patient name: Jamie Lutz MRN [id number]  Date of birth: 07/29/1995 Chief Complaint:   Routine Prenatal Visit  History of Present Illness:   Jamie Lutz is a 22 y.o. [phone] female at 5114w6d with an Estimated Date of Delivery: 03/16/18 being seen today for ongoing management of a low-risk pregnancy.  Today she reports no complaints. Contractions: Irritability. Vag. Bleeding: None.  Movement: Present. denies leaking of fluid. Review of Systems:   Pertinent items are noted in HPI Denies abnormal vaginal discharge w/ itching/odor/irritation, headaches, visual changes, shortness of breath, chest pain, abdominal pain, severe nausea/vomiting, or problems with urination or bowel movements unless otherwise stated above. Pertinent History Reviewed:  Reviewed past medical,surgical, social, obstetrical and family history.  Reviewed problem list, medications and allergies. Physical Assessment:   Vitals:   01/18/18 1110  BP: 104/69  Pulse: 82  Weight: 175 lb (79.4 kg)  Body mass index is 30.04 kg/m.        Physical Examination:   General appearance: Well appearing, and in no distress  Mental status: Alert, oriented to person, place, and time  Skin: Warm & dry  Cardiovascular: Normal heart rate noted  Respiratory: Normal respiratory effort, no distress  Abdomen: Soft, gravid, nontender  Pelvic: Cervical exam deferred         Extremities: Edema: None  Fetal Status: Fetal Heart Rate (bpm): 139 Fundal Height: 32 cm Movement: Present    No results found for this or any previous visit (from the past 24 hour(s)).  Assessment & Plan:  1) Low-risk pregnancy G4P3003 at 10914w6d with an Estimated Date of Delivery: 03/16/18      Meds: No orders of the defined types were placed in this encounter.  Labs/procedures today:   Plan:  Continue routine obstetrical care   Reviewed: Preterm labor symptoms and general obstetric precautions including but not limited to  vaginal bleeding, contractions, leaking of fluid and fetal movement were reviewed in detail with the patient.  All questions were answered  Follow-up: Return in about 2 weeks (around 02/01/2018) for LROB.  No orders of the defined types were placed in this encounter.  Lazaro ArmsLuther H Jovani Colquhoun  01/18/2018 11:32 AM

## 2018-01-20 NOTE — L&D Delivery Note (Signed)
OB/[facility]N [facility] Delivery Note  Jamie Lutz is a 23 y.o. [phone] s/p NSVD at 100w4d. She was admitted for labor.   ROM: 0h 81m with clear fluid GBS Status: positive Maximum Maternal Temperature: 97.7  Labor Progress: . Patient arrived to MAU complaining of labor since 2100. At 0300 she was 1.5/50/-2-3. She walked for an hour. . At 0500 she was 6/100/-3 and I was called for admission . She called out the nurse that she had an urge to push and had a precipitous delivery in the MAU at 0514.  Delivery Date/Time: 03/13/18 0514 Delivery: Called to room after infant delivered. Cord clamped x 2 several minutes after delivery, and cut by father of baby. Cord blood drawn. Placenta delivered spontaneously with gentle cord traction. Fundus firm with massage and Pitocin. Labia, perineum, vagina, and cervix inspected inspected with left inferior labial tear that was not hemostatic, repaired with 4-0 vicryl.   Placenta: intact Complications: precipitious delivery Lacerations: left inferior labial EBL: 0 Analgesia: lidocaine  Postpartum Planning [x]  message to sent to schedule follow-up  Please schedule this patient for Postpartum visit in: 4 weeks with the following provider: Any provider For C/S patients schedule nurse incision check in weeks 2 weeks: no Low risk pregnancy complicated by: none Delivery mode:  SVD Anticipated Birth Control:  other/unsure PP Procedures needed: none  Schedule Integrated BH visit: yes\  [x]  vaccines UTD  Infant: Jamie Lutz  APGARs 9/9  weight not yet done [place] S. Earlene Plater, DO OB/GYN Fellow, Faculty Practice

## 2018-02-01 ENCOUNTER — Encounter: Payer: Self-pay | Admitting: Family Medicine

## 2018-02-08 ENCOUNTER — Telehealth: Payer: Self-pay | Admitting: *Deleted

## 2018-02-08 NOTE — Telephone Encounter (Signed)
LMOVM following up on phone call from the weekend when she called after hours line.

## 2018-02-10 ENCOUNTER — Encounter (HOSPITAL_COMMUNITY): Payer: Self-pay | Admitting: *Deleted

## 2018-02-10 ENCOUNTER — Inpatient Hospital Stay (HOSPITAL_COMMUNITY)
Admission: AD | Admit: 2018-02-10 | Discharge: 2018-02-10 | Disposition: A | Discharge status: Home / Self Care | Payer: Self-pay | Attending: Obstetrics & Gynecology | Admitting: Obstetrics & Gynecology

## 2018-02-10 DIAGNOSIS — H832X9 Labyrinthine dysfunction, unspecified ear: Secondary | ICD-10-CM

## 2018-02-10 DIAGNOSIS — R42 Dizziness and giddiness: Secondary | ICD-10-CM | POA: Insufficient documentation

## 2018-02-10 DIAGNOSIS — O26893 Other specified pregnancy related conditions, third trimester: Secondary | ICD-10-CM | POA: Insufficient documentation

## 2018-02-10 DIAGNOSIS — Z3A35 35 weeks gestation of pregnancy: Secondary | ICD-10-CM | POA: Insufficient documentation

## 2018-02-10 DIAGNOSIS — E878 Other disorders of electrolyte and fluid balance, not elsewhere classified: Secondary | ICD-10-CM | POA: Insufficient documentation

## 2018-02-10 LAB — CBC
HCT: 33.2 % — ABNORMAL LOW (ref 36.0–46.0)
Hemoglobin: 10.6 g/dL — ABNORMAL LOW (ref 12.0–15.0)
MCH: 26 pg (ref 26.0–34.0)
MCHC: 31.9 g/dL (ref 30.0–36.0)
MCV: 81.4 fL (ref 80.0–100.0)
Platelets: 179 10*3/uL (ref 150–400)
RBC: 4.08 MIL/uL (ref 3.87–5.11)
RDW: 15.7 % — ABNORMAL HIGH (ref 11.5–15.5)
WBC: 10.2 10*3/uL (ref 4.0–10.5)

## 2018-02-10 LAB — COMPREHENSIVE METABOLIC PANEL
ALT: 11 U/L (ref 0–44)
AST: 14 U/L — ABNORMAL LOW (ref 15–41)
Albumin: 2.8 g/dL — ABNORMAL LOW (ref 3.5–5.0)
Alkaline Phosphatase: 150 U/L — ABNORMAL HIGH (ref 38–126)
Anion gap: 9 (ref 5–15)
BUN: 9 mg/dL (ref 6–20)
CO2: 20 mmol/L — ABNORMAL LOW (ref 22–32)
CREATININE: 0.49 mg/dL (ref 0.44–1.00)
Calcium: 8.2 mg/dL — ABNORMAL LOW (ref 8.9–10.3)
Chloride: 105 mmol/L (ref 98–111)
GFR calc Af Amer: >60 mL/min (ref >60)
GFR calc non Af Amer: >60 mL/min (ref >60)
Glucose, Bld: 126 mg/dL — ABNORMAL HIGH (ref 70–99)
Potassium: 3.6 mmol/L (ref 3.5–5.1)
Sodium: 134 mmol/L — ABNORMAL LOW (ref 135–145)
Total Bilirubin: 0.5 mg/dL (ref 0.3–1.2)
Total Protein: 6.2 g/dL — ABNORMAL LOW (ref 6.5–8.1)

## 2018-02-10 LAB — URINALYSIS, ROUTINE W REFLEX MICROSCOPIC
Bilirubin Urine: NEGATIVE
Glucose, UA: NEGATIVE mg/dL
Hgb urine dipstick: NEGATIVE
Ketones, ur: NEGATIVE mg/dL
Nitrite: NEGATIVE
Protein, ur: NEGATIVE mg/dL
Specific Gravity, Urine: 1.014 (ref 1.005–1.030)
pH: 7 (ref 5.0–8.0)

## 2018-02-10 MED ORDER — MECLIZINE HCL 12.5 MG PO TABS: 12.5000 mg | ORAL_TABLET | Freq: Three times a day (TID) | ORAL | 0 refills | Status: DC | PRN

## 2018-02-10 MED ORDER — PSEUDOEPHEDRINE HCL 30 MG PO TABS: 30.0000 mg | ORAL_TABLET | ORAL | 0 refills | Status: DC | PRN

## 2018-02-10 NOTE — MAU Provider Note (Signed)
Chief Complaint:  Dizziness and Nausea   First Provider Initiated Contact with Patient 02/10/18 2158     HPI: Jamie Lutz is a 23 y.o. [phone] at 1135w2dwho presents to maternity admissions reporting dizziness, nausea, and "weakness in my arms especially the right one" since yesterday.  States called office and they told her to come here. Feels light headed and also "head spinning" sensation at times.  Able to eat and drink easily.  . She reports good fetal movement, denies LOF, vaginal bleeding, vaginal itching/burning, urinary symptoms, h/a, dizziness, n/v, diarrhea, constipation or fever/chills.   [facility].   Dizziness  This is a new problem. The current episode started yesterday. The problem occurs constantly. The problem has been unchanged. Associated symptoms include fatigue, nausea, vertigo and weakness. Pertinent negatives include no abdominal pain, chills, congestion, coughing, fever, headaches, myalgias, numbness, urinary symptoms, visual change or vomiting. Nothing aggravates the symptoms. She has tried nothing for the symptoms.    RN note: Pt reports dizziness and nausea that started yesterday. Pt reports that her arms feel weak, mostly her right arm, that started a few days ago. Pt denies vomiting. Pt reports some lower back pain. States it feels hard to breathe that started earlier today.   Past Medical History: Past Medical History:  Diagnosis Date  . Anxiety   . Diabetes mellitus without complication (HCC)   . Gestational diabetes   . No pertinent past medical history     Past obstetric history: OB History  Gravida Para Term Preterm AB Living  4 3 3  0 0 3  SAB TAB Ectopic Multiple Live Births  0 0 0 0 3    # Outcome Date GA Lbr Len/2nd Weight Sex Delivery Anes PTL Lv  4 Current           3 Term 04/23/16 4258w2d 01:50 / 00:09 3056 g M Vag-Spont None  LIV     Complications: Gestational diabetes  2 Term 06/11/12 5173w4d 14:17 / 00:05 3192 g M  Vag-Spont None N LIV     Birth Comments: Born at 38 weeks to 23 y.o.  [id number]. No issue or complication around birth or delivery.   1 Term 04/16/11 4769w3d 14:50 / 00:39 2486 g M Vag-Spont Local N LIV    Past Surgical History: Past Surgical History:  Procedure Laterality Date  . NO PAST SURGERIES    . PERINEAL LACERATION REPAIR  04/17/2011   Procedure: SUTURE REPAIR PERINEAL LACERATION;  Surgeon: Tilda BurrowJohn V Ferguson, MD;  Location: [facility];  Service: Gynecology;  Laterality: N/A;    Family History: Family History  Problem Relation Age of Onset  . Hypothyroidism Mother   . Diabetes Maternal Grandmother   . Cancer Maternal Grandfather        esophagus    Social History: Social History   Tobacco Use  . Smoking status: Never Smoker  . Smokeless tobacco: Never Used  Substance Use Topics  . Alcohol use: No    Alcohol/week: 0.0 standard drinks  . Drug use: No    Allergies: No Known Allergies  Meds:  No medications prior to admission.    I have reviewed patient's Past Medical Hx, Surgical Hx, Family Hx, Social Hx, medications and allergies.   ROS:  Review of Systems  Constitutional: Positive for fatigue. Negative for chills and fever.  HENT: Negative for congestion.   Respiratory: Negative for cough.   Gastrointestinal: Positive for nausea. Negative for abdominal pain and vomiting.  Musculoskeletal: Negative  for myalgias.  Neurological: Positive for dizziness, vertigo and weakness. Negative for numbness and headaches.   Other systems negative  Physical Exam   Patient Vitals for the past 24 hrs:  BP Temp Pulse Resp SpO2 Height Weight  02/10/18 2349 107/63 - 86 16 - - -  02/10/18 2141 114/65 - (!) 105 - - - -  02/10/18 2139 105/64 - (!) 113 - - - -  02/10/18 2138 (!) 97/55 - 90 - - - -  02/10/18 2137 102/60 - 96 - - - -  02/10/18 2123 113/70 98.1 F (36.7 C) 83 16 98 % 5\' 4"  (1.626 m) 82.6 kg   Constitutional: Well-developed, well-nourished female in no acute distress.   Ears:  Bilateral canals mildly erethematous, TMs clear Cardiovascular: normal rate and rhythm Respiratory: normal effort, clear to auscultation bilaterally GI: Abd soft, non-tender, gravid appropriate for gestational age.   No rebound or guarding. MS: Extremities nontender, no edema, normal ROM Neurologic: Alert and oriented x 4.  GU: Neg CVAT.  PELVIC EXAM:  deferred  FHT:  Baseline 140 , moderate variability, accelerations present, no decelerations Contractions: Occasional  Irregular     Labs: Results for orders placed or performed during the hospital encounter of 02/10/18 (from the past 24 hour(s))  Urinalysis, Routine w reflex microscopic     Status: Abnormal   Collection Time: 02/10/18  9:38 PM  Result Value Ref Range   Color, Urine YELLOW YELLOW   APPearance CLEAR CLEAR   Specific Gravity, Urine 1.014 1.005 - 1.030   pH 7.0 5.0 - 8.0   Glucose, UA NEGATIVE NEGATIVE mg/dL   Hgb urine dipstick NEGATIVE NEGATIVE   Bilirubin Urine NEGATIVE NEGATIVE   Ketones, ur NEGATIVE NEGATIVE mg/dL   Protein, ur NEGATIVE NEGATIVE mg/dL   Nitrite NEGATIVE NEGATIVE   Leukocytes, UA TRACE (A) NEGATIVE   RBC / HPF 0-5 0 - 5 RBC/hpf   WBC, UA 0-5 0 - 5 WBC/hpf   Bacteria, UA RARE (A) NONE SEEN   Squamous Epithelial / LPF 0-5 0 - 5   Mucus PRESENT   CBC     Status: Abnormal   Collection Time: 02/10/18 10:13 PM  Result Value Ref Range   WBC 10.2 4.0 - 10.5 K/uL   RBC 4.08 3.87 - 5.11 MIL/uL   Hemoglobin 10.6 (L) 12.0 - 15.0 g/dL   HCT 26.4 (L) 15.8 - 30.9 %   MCV 81.4 80.0 - 100.0 fL   MCH 26.0 26.0 - 34.0 pg   MCHC 31.9 30.0 - 36.0 g/dL   RDW 40.7 (H) 68.0 - 88.1 %   Platelets 179 150 - 400 K/uL  Comprehensive metabolic panel     Status: Abnormal   Collection Time: 02/10/18 10:13 PM  Result Value Ref Range   Sodium 134 (L) 135 - 145 mmol/L   Potassium 3.6 3.5 - 5.1 mmol/L   Chloride 105 98 - 111 mmol/L   CO2 20 (L) 22 - 32 mmol/L   Glucose, Bld 126 (H) 70 - 99 mg/dL   BUN 9 6 -  20 mg/dL   Creatinine, Ser 1.03 0.44 - 1.00 mg/dL   Calcium 8.2 (L) 8.9 - 10.3 mg/dL   Total Protein 6.2 (L) 6.5 - 8.1 g/dL   Albumin 2.8 (L) 3.5 - 5.0 g/dL   AST 14 (L) 15 - 41 U/L   ALT 11 0 - 44 U/L   Alkaline Phosphatase 150 (H) 38 - 126 U/L   Total Bilirubin 0.5 0.3 - 1.2  mg/dL   GFR calc non Af Amer >60 >60 mL/min   GFR calc Af Amer >60 >60 mL/min   Anion gap 9 5 - 15   O/Positive/-- (10/11 1224)  Imaging:  No results found.  MAU Course/MDM: I have ordered labs and reviewed results. No evidence of systemic dysfunction or infection NST reviewed, reassuring Discussed this may be related to ear fluid/disequilibrium.   Consult Dr Despina HiddenEure with presentation, exam findings and test results.  He recommends sudafed and Meclizine.  Supportive care and good hydration reviewed.    Assessment: 1. Dizziness   2. Vestibular disequilibrium, unspecified laterality     Plan: Discharge home Rx Sudafed, and meclizine for dizziness/vertigo Labor precautions and fetal kick counts Follow up in Office for prenatal visits and recheck Follow-up Information    [facility].   Contact information: [address] [place] [phone]         Encouraged to return here or to other Urgent Care/ED if she develops worsening of symptoms, increase in pain, fever, or other concerning symptoms.  Pt stable at time of discharge.  Wynelle BourgeoisMarie Williams CNM, MSN Certified Nurse-Midwife 02/11/2018 1:17 AM

## 2018-02-10 NOTE — Discharge Instructions (Signed)
Dizziness Dizziness is a common problem. It is a feeling of unsteadiness or light-headedness. You may feel like you are about to faint. Dizziness can lead to injury if you stumble or fall. Anyone can become dizzy, but dizziness is more common in older adults. This condition can be caused by a number of things, including medicines, dehydration, or illness. Follow these instructions at home: Eating and drinking  Drink enough fluid to keep your urine clear or pale yellow. This helps to keep you from becoming dehydrated. Try to drink more clear fluids, such as water.  Do not drink alcohol.  Limit your caffeine intake if told to do so by your health care provider. Check ingredients and nutrition facts to see if a food or beverage contains caffeine.  Limit your salt (sodium) intake if told to do so by your health care provider. Check ingredients and nutrition facts to see if a food or beverage contains sodium. Activity  Avoid making quick movements. ? Rise slowly from chairs and steady yourself until you feel okay. ? In the morning, first sit up on the side of the bed. When you feel okay, stand slowly while you hold onto something until you know that your balance is fine.  If you need to stand in one place for a long time, move your legs often. Tighten and relax the muscles in your legs while you are standing.  Do not drive or use heavy machinery if you feel dizzy.  Avoid bending down if you feel dizzy. Place items in your home so that they are easy for you to reach without leaning over. Lifestyle  Do not use any products that contain nicotine or tobacco, such as cigarettes and e-cigarettes. If you need help quitting, ask your health care provider.  Try to reduce your stress level by using methods such as yoga or meditation. Talk with your health care provider if you need help to manage your stress. General instructions  Watch your dizziness for any changes.  Take over-the-counter and  prescription medicines only as told by your health care provider. Talk with your health care provider if you think that your dizziness is caused by a medicine that you are taking.  Tell a friend or a family member that you are feeling dizzy. If he or she notices any changes in your behavior, have this person call your health care provider.  Keep all follow-up visits as told by your health care provider. This is important. Contact a health care provider if:  Your dizziness does not go away.  Your dizziness or light-headedness gets worse.  You feel nauseous.  You have reduced hearing.  You have new symptoms.  You are unsteady on your feet or you feel like the room is spinning. Get help right away if:  You vomit or have diarrhea and are unable to eat or drink anything.  You have problems talking, walking, swallowing, or using your arms, hands, or legs.  You feel generally weak.  You are not thinking clearly or you have trouble forming sentences. It may take a friend or family member to notice this.  You have chest pain, abdominal pain, shortness of breath, or sweating.  Your vision changes.  You have any bleeding.  You have a severe headache.  You have neck pain or a stiff neck.  You have a fever. These symptoms may represent a serious problem that is an emergency. Do not wait to see if the symptoms will go away. Get medical help   right away. Call your local emergency services (911 in the [place] Do not drive yourself to the hospital. Summary  Dizziness is a feeling of unsteadiness or light-headedness. This condition can be caused by a number of things, including medicines, dehydration, or illness.  Anyone can become dizzy, but dizziness is more common in older adults.  Drink enough fluid to keep your urine clear or pale yellow. Do not drink alcohol.  Avoid making quick movements if you feel dizzy. Monitor your dizziness for any changes. This information is not intended to  replace advice given to you by your health care provider. Make sure you discuss any questions you have with your health care provider. Document Released: 07/02/2000 Document Revised: 02/09/2016 Document Reviewed: 02/09/2016 Elsevier Interactive Patient Education  2019 [employer]  

## 2018-02-10 NOTE — MAU Note (Signed)
Pt reports dizziness and nausea that started yesterday. Pt reports that her arms feel weak, mostly her right arm, that started a few days ago. Pt denies vomiting. Pt reports some lower back pain. States it feels hard to breathe that started earlier today.

## 2018-02-11 ENCOUNTER — Ambulatory Visit (INDEPENDENT_AMBULATORY_CARE_PROVIDER_SITE_OTHER): Payer: Self-pay | Admitting: Women's Health

## 2018-02-11 ENCOUNTER — Encounter: Payer: Self-pay | Admitting: Women's Health

## 2018-02-11 VITALS — BP 109/66 | HR 102 | Wt 183.0 lb

## 2018-02-11 DIAGNOSIS — Z3483 Encounter for supervision of other normal pregnancy, third trimester: Secondary | ICD-10-CM

## 2018-02-11 DIAGNOSIS — Z331 Pregnant state, incidental: Secondary | ICD-10-CM

## 2018-02-11 DIAGNOSIS — Z1389 Encounter for screening for other disorder: Secondary | ICD-10-CM

## 2018-02-11 DIAGNOSIS — Z3A35 35 weeks gestation of pregnancy: Secondary | ICD-10-CM

## 2018-02-11 LAB — POCT URINALYSIS DIPSTICK OB
Blood, UA: NEGATIVE
Glucose, UA: NEGATIVE
KETONES UA: NEGATIVE
Leukocytes, UA: NEGATIVE
Nitrite, UA: NEGATIVE
POC,PROTEIN,UA: NEGATIVE

## 2018-02-11 NOTE — Patient Instructions (Signed)
Theodora BlowMaria G Tello-Banda, I greatly value your feedback.  If you receive a survey following your visit with us today, we appreciate you taking the time to fill it out.  Thanks, Joellyn HaffKim Malak Orantes, CNM, WHNP-BC   Call the office [phone]) or go to [facility] if:  You begin to have strong, frequent contractions  Your water breaks.  Sometimes it is a big gush of fluid, sometimes it is just a trickle that keeps getting your panties wet or running down your legs  You have vaginal bleeding.  It is normal to have a small amount of spotting if your cervix was checked.   You don't feel your baby moving like normal.  If you don't, get you something to eat and drink and lay down and focus on feeling your baby move.  You should feel at least 10 movements in 2 hours.  If you don't, you should call the office or go to [facility].     Preterm Labor and Birth Information  The normal length of a pregnancy is 39-41 weeks. Preterm labor is when labor starts before 37 completed weeks of pregnancy. What are the risk factors for preterm labor? Preterm labor is more likely to occur in women who:  Have certain infections during pregnancy such as a bladder infection, sexually transmitted infection, or infection inside the uterus (chorioamnionitis).  Have a shorter-than-normal cervix.  Have gone into preterm labor before.  Have had surgery on their cervix.  Are younger than age 25 or older than age 25.  Are African American.  Are pregnant with twins or multiple babies (multiple gestation).  Take street drugs or smoke while pregnant.  Do not gain enough weight while pregnant.  Became pregnant shortly after having been pregnant. What are the symptoms of preterm labor? Symptoms of preterm labor include:  Cramps similar to those that can happen during a menstrual period. The cramps may happen with diarrhea.  Pain in the abdomen or lower back.  Regular uterine contractions that may feel like  tightening of the abdomen.  A feeling of increased pressure in the pelvis.  Increased watery or bloody mucus discharge from the vagina.  Water breaking (ruptured amniotic sac). Why is it important to recognize signs of preterm labor? It is important to recognize signs of preterm labor because babies who are born prematurely may not be fully developed. This can put them at an increased risk for:  Long-term (chronic) heart and lung problems.  Difficulty immediately after birth with regulating body systems, including blood sugar, body temperature, heart rate, and breathing rate.  Bleeding in the brain.  Cerebral palsy.  Learning difficulties.  Death. These risks are highest for babies who are born before 34 weeks of pregnancy. How is preterm labor treated? Treatment depends on the length of your pregnancy, your condition, and the health of your baby. It may involve:  Having a stitch (suture) placed in your cervix to prevent your cervix from opening too early (cerclage).  Taking or being given medicines, such as: ? Hormone medicines. These may be given early in pregnancy to help support the pregnancy. ? Medicine to stop contractions. ? Medicines to help mature the baby's lungs. These may be prescribed if the risk of delivery is high. ? Medicines to prevent your baby from developing cerebral palsy. If the labor happens before 34 weeks of pregnancy, you may need to stay in the hospital. What should I do if I think I am in preterm labor? If you think that  you are going into preterm labor, call your health care provider right away. How can I prevent preterm labor in future pregnancies? To increase your chance of having a full-term pregnancy:  Do not use any tobacco products, such as cigarettes, chewing tobacco, and e-cigarettes. If you need help quitting, ask your health care provider.  Do not use street drugs or medicines that have not been prescribed to you during your  pregnancy.  Talk with your health care provider before taking any herbal supplements, even if you have been taking them regularly.  Make sure you gain a healthy amount of weight during your pregnancy.  Watch for infection. If you think that you might have an infection, get it checked right away.  Make sure to tell your health care provider if you have gone into preterm labor before. This information is not intended to replace advice given to you by your health care provider. Make sure you discuss any questions you have with your health care provider. Document Released: 03/29/2003 Document Revised: 06/19/2015 Document Reviewed: 05/30/2015 Elsevier Interactive Patient Education  2019 [employer].

## 2018-02-11 NOTE — Progress Notes (Signed)
LOW-RISK PREGNANCY VISIT Patient name: Jamie Lutz MRN [id number]  Date of birth: 11-21-1995 Chief Complaint:   Routine Prenatal Visit  History of Present Illness:   Jamie Lutz is a 23 y.o. [phone] female at [redacted]w[redacted]d with an Estimated Date of Delivery: 03/16/18 being seen today for ongoing management of a low-risk pregnancy.  Today she reports no complaints. Went to MAU last night for dizziness, rx'd antivert- hasn't picked it up. Contractions: Not present. Vag. Bleeding: None.  Movement: Present. denies leaking of fluid. Review of Systems:   Pertinent items are noted in HPI Denies abnormal vaginal discharge w/ itching/odor/irritation, headaches, visual changes, shortness of breath, chest pain, abdominal pain, severe nausea/vomiting, or problems with urination or bowel movements unless otherwise stated above. Pertinent History Reviewed:  Reviewed past medical,surgical, social, obstetrical and family history.  Reviewed problem list, medications and allergies. Physical Assessment:   Vitals:   02/11/18 1108  BP: 109/66  Pulse: (!) 102  Weight: 183 lb (83 kg)  Body mass index is 31.41 kg/m.        Physical Examination:   General appearance: Well appearing, and in no distress  Mental status: Alert, oriented to person, place, and time  Skin: Warm & dry  Cardiovascular: Normal heart rate noted  Respiratory: Normal respiratory effort, no distress  Abdomen: Soft, gravid, nontender  Pelvic: Cervical exam deferred         Extremities: Edema: None  Fetal Status: Fetal Heart Rate (bpm): 135 Fundal Height: 35 cm Movement: Present    Results for orders placed or performed in visit on 02/11/18 (from the past 24 hour(s))  POC Urinalysis Dipstick OB   Collection Time: 02/11/18 11:13 AM  Result Value Ref Range   Color, UA     Clarity, UA     Glucose, UA Negative Negative   Bilirubin, UA     Ketones, UA neg    Spec Grav, UA     Blood, UA neg    pH, UA     POC,PROTEIN,UA  Negative Negative, Trace, Small (1+), Moderate (2+), Large (3+), 4+   Urobilinogen, UA     Nitrite, UA neg    Leukocytes, UA Negative Negative   Appearance     Odor    Results for orders placed or performed during the hospital encounter of 02/10/18 (from the past 24 hour(s))  Urinalysis, Routine w reflex microscopic   Collection Time: 02/10/18  9:38 PM  Result Value Ref Range   Color, Urine YELLOW YELLOW   APPearance CLEAR CLEAR   Specific Gravity, Urine 1.014 1.005 - 1.030   pH 7.0 5.0 - 8.0   Glucose, UA NEGATIVE NEGATIVE mg/dL   Hgb urine dipstick NEGATIVE NEGATIVE   Bilirubin Urine NEGATIVE NEGATIVE   Ketones, ur NEGATIVE NEGATIVE mg/dL   Protein, ur NEGATIVE NEGATIVE mg/dL   Nitrite NEGATIVE NEGATIVE   Leukocytes, UA TRACE (A) NEGATIVE   RBC / HPF 0-5 0 - 5 RBC/hpf   WBC, UA 0-5 0 - 5 WBC/hpf   Bacteria, UA RARE (A) NONE SEEN   Squamous Epithelial / LPF 0-5 0 - 5   Mucus PRESENT   CBC   Collection Time: 02/10/18 10:13 PM  Result Value Ref Range   WBC 10.2 4.0 - 10.5 K/uL   RBC 4.08 3.87 - 5.11 MIL/uL   Hemoglobin 10.6 (L) 12.0 - 15.0 g/dL   HCT 93.7 (L) 16.9 - 67.8 %   MCV 81.4 80.0 - 100.0 fL   MCH 26.0 26.0 -  34.0 pg   MCHC 31.9 30.0 - 36.0 g/dL   RDW 02.3 (H) 34.3 - 56.8 %   Platelets 179 150 - 400 K/uL  Comprehensive metabolic panel   Collection Time: 02/10/18 10:13 PM  Result Value Ref Range   Sodium 134 (L) 135 - 145 mmol/L   Potassium 3.6 3.5 - 5.1 mmol/L   Chloride 105 98 - 111 mmol/L   CO2 20 (L) 22 - 32 mmol/L   Glucose, Bld 126 (H) 70 - 99 mg/dL   BUN 9 6 - 20 mg/dL   Creatinine, Ser 6.16 0.44 - 1.00 mg/dL   Calcium 8.2 (L) 8.9 - 10.3 mg/dL   Total Protein 6.2 (L) 6.5 - 8.1 g/dL   Albumin 2.8 (L) 3.5 - 5.0 g/dL   AST 14 (L) 15 - 41 U/L   ALT 11 0 - 44 U/L   Alkaline Phosphatase 150 (H) 38 - 126 U/L   Total Bilirubin 0.5 0.3 - 1.2 mg/dL   GFR calc non Af Amer >60 >60 mL/min   GFR calc Af Amer >60 >60 mL/min   Anion gap 9 5 - 15    Assessment &  Plan:  1) Low-risk pregnancy G4P3003 at [redacted]w[redacted]d with an Estimated Date of Delivery: 03/16/18    Meds: No orders of the defined types were placed in this encounter.  Labs/procedures today: none  Plan:  Continue routine obstetrical care   Reviewed: Preterm labor symptoms and general obstetric precautions including but not limited to vaginal bleeding, contractions, leaking of fluid and fetal movement were reviewed in detail with the patient.  All questions were answered  Follow-up: Return in about 12 days (around 02/23/2018) for LROB. (currently self-pay, wanted to come back at 37wks, instead of 36)  Orders Placed This Encounter  Procedures  . POC Urinalysis Dipstick OB   Cheral Marker CNM, [facility] 02/11/2018 11:37 AM

## 2018-02-25 ENCOUNTER — Ambulatory Visit (INDEPENDENT_AMBULATORY_CARE_PROVIDER_SITE_OTHER): Payer: Self-pay | Admitting: Advanced Practice Midwife

## 2018-02-25 VITALS — BP 105/67 | HR 88 | Wt 185.0 lb

## 2018-02-25 DIAGNOSIS — Z3483 Encounter for supervision of other normal pregnancy, third trimester: Secondary | ICD-10-CM

## 2018-02-25 DIAGNOSIS — Z3A37 37 weeks gestation of pregnancy: Secondary | ICD-10-CM

## 2018-02-25 DIAGNOSIS — Z1389 Encounter for screening for other disorder: Secondary | ICD-10-CM

## 2018-02-25 DIAGNOSIS — Z331 Pregnant state, incidental: Secondary | ICD-10-CM

## 2018-02-25 LAB — POCT URINALYSIS DIPSTICK OB
Glucose, UA: NEGATIVE
Ketones, UA: NEGATIVE
Leukocytes, UA: NEGATIVE
Nitrite, UA: NEGATIVE
POC,PROTEIN,UA: NEGATIVE
RBC UA: NEGATIVE

## 2018-02-25 NOTE — Patient Instructions (Addendum)
Hypertension During Pregnancy ° °Hypertension, commonly called high blood pressure, is when the force of blood pumping through your arteries is too strong. Arteries are blood vessels that carry blood from the heart throughout the body. Hypertension during pregnancy can cause problems for you and your baby. Your baby may be born early (prematurely) or may not weigh as much as he or she should at birth. Very bad cases of hypertension during pregnancy can be life-threatening. °Different types of hypertension can occur during pregnancy. These include: °· Chronic hypertension. This happens when: °? You have hypertension before pregnancy and it continues during pregnancy. °? You develop hypertension before you are [redacted] weeks pregnant, and it continues during pregnancy. °· Gestational hypertension. This is hypertension that develops after the 20th week of pregnancy. °· Preeclampsia, also called toxemia of pregnancy. This is a very serious type of hypertension that develops during pregnancy. It can be very dangerous for you and your baby. °? In rare cases, you may develop preeclampsia after giving birth (postpartum preeclampsia). This usually occurs within 48 hours after childbirth but may occur up to 6 weeks after giving birth. °Gestational hypertension and preeclampsia usually go away within 6 weeks after your baby is born. Women who have hypertension during pregnancy have a greater chance of developing hypertension later in life or during future pregnancies. °What are the causes? °The exact cause of hypertension during pregnancy is not known. °What increases the risk? °There are certain factors that make it more likely for you to develop hypertension during pregnancy. These include: °· Having hypertension during a previous pregnancy or prior to pregnancy. °· Being overweight. °· Being age 35 or older. °· Being pregnant for the first time. °· Being pregnant with more than one baby. °· Becoming pregnant using fertilization  methods such as IVF (in vitro fertilization). °· Having diabetes, kidney problems, or systemic lupus erythematosus. °· Having a family history of hypertension. °What are the signs or symptoms? °Chronic hypertension and gestational hypertension rarely cause symptoms. Preeclampsia causes symptoms, which may include: °· Increased protein in your urine. Your health care provider will check for this at every visit before you give birth (prenatal visit). °· Severe headaches. °· Sudden weight gain. °· Swelling of the hands, face, legs, and feet. °· Nausea and vomiting. °· Vision problems, such as blurred or double vision. °· Numbness in the face, arms, legs, and feet. °· Dizziness. °· Slurred speech. °· Sensitivity to bright lights. °· Abdominal pain. °· Convulsions or seizures. °How is this diagnosed? °You may be diagnosed with hypertension during a routine prenatal exam. At each prenatal visit, you may: °· Have a urine test to check for high amounts of protein in your urine. °· Have your blood pressure checked. A blood pressure reading is given as two numbers, such as "120 over 80" (or 120/80). The first ("top") number is a measure of the pressure in your arteries when your heart beats (systolic pressure). The second ("bottom") number is a measure of the pressure in your arteries as your heart relaxes between beats (diastolic pressure). Blood pressure is measured in a unit called mm Hg. For most women, a normal blood pressure reading is: °? Systolic: below 120. °? Diastolic: below 80. °The type of hypertension that you are diagnosed with depends on your test results and when your symptoms developed. °· Chronic hypertension is usually diagnosed before 20 weeks of pregnancy. °· Gestational hypertension is usually diagnosed after 20 weeks of pregnancy. °· Hypertension with high amounts of protein in   the urine is diagnosed as preeclampsia. °· Blood pressure measurements that stay above 160 systolic, or above 110 diastolic,  are signs of severe preeclampsia. °How is this treated? °Treatment for hypertension during pregnancy varies depending on the type of hypertension you have and how serious it is. °· If you take medicines called ACE inhibitors to treat chronic hypertension, you may need to switch medicines. ACE inhibitors should not be taken during pregnancy. °· If you have gestational hypertension, you may need to take blood pressure medicine. °· If you are at risk for preeclampsia, your health care provider may recommend that you take a low-dose aspirin during your pregnancy. °· If you have severe preeclampsia, you may need to be hospitalized so you and your baby can be monitored closely. You may also need to take medicine (magnesium sulfate) to prevent seizures and to lower blood pressure. This medicine may be given as an injection or through an IV. °· In some cases, if your condition gets worse, you may need to deliver your baby early. °Follow these instructions at home: °Eating and drinking ° °· Drink enough fluid to keep your urine pale yellow. °· Avoid caffeine. °Lifestyle °· Do not use any products that contain nicotine or tobacco, such as cigarettes and e-cigarettes. If you need help quitting, ask your health care provider. °· Do not use alcohol or drugs. °· Avoid stress as much as possible. Rest and get plenty of sleep. °General instructions °· Take over-the-counter and prescription medicines only as told by your health care provider. °· While lying down, lie on your left side. This keeps pressure off your major blood vessels. °· While sitting or lying down, raise (elevate) your feet. Try putting some pillows under your lower legs. °· Exercise regularly. Ask your health care provider what kinds of exercise are best for you. °· Keep all prenatal and follow-up visits as told by your health care provider. This is important. °Contact a health care provider if: °· You have symptoms that your health care provider told you may  require more treatment or monitoring, such as: °? Nausea or vomiting. °? Headache. °Get help right away if you have: °· Severe abdominal pain that does not get better with treatment. °· A severe headache that does not get better. °· Vomiting that does not get better. °· Sudden, rapid weight gain. °· Sudden swelling in your hands, ankles, or face. °· Vaginal bleeding. °· Blood in your urine. °· Fewer movements from your baby than usual. °· Blurred or double vision. °· Muscle twitching or sudden muscle tightening (spasms). °· Shortness of breath. °· Blue fingernails or lips. °Summary °· Hypertension, commonly called high blood pressure, is when the force of blood pumping through your arteries is too strong. °· Hypertension during pregnancy can cause problems for you and your baby. °· Treatment for hypertension during pregnancy varies depending on the type of hypertension you have and how serious it is. °· Get help right away if you have symptoms that your health care provider told you to watch for. °This information is not intended to replace advice given to you by your health care provider. Make sure you discuss any questions you have with your health care provider. °Document Released: 09/24/2010 Document Revised: 12/23/2016 Document Reviewed: 06/22/2015 °Elsevier Interactive Patient Education © 2019 [employer] ° °

## 2018-02-25 NOTE — Progress Notes (Signed)
  [id number] [redacted]w[redacted]d Estimated Date of Delivery: 03/16/18  Blood pressure 105/67, pulse 88, weight 185 lb (83.9 kg), last menstrual period 06/09/2017, currently breastfeeding.   BP weight and urine results all reviewed and noted.  Please refer to the obstetrical flow sheet for the fundal height and fetal heart rate documentation:  Patient reports good fetal movement, denies any bleeding and no rupture of membranes symptoms or regular contractions. Patient is without complaints. All questions were answered.   Physical Assessment:   Vitals:   02/25/18 0912  BP: 105/67  Pulse: 88  Weight: 185 lb (83.9 kg)  Body mass index is 31.76 kg/m.        Physical Examination:   General appearance: Well appearing, and in no distress  Mental status: Alert, oriented to person, place, and time  Skin: Warm & dry  Cardiovascular: Normal heart rate noted  Respiratory: Normal respiratory effort, no distress  Abdomen: Soft, gravid, nontender  Pelvic: Cervical exam performed  Dilation: Fingertip Effacement (%): Thick Station: -3  Extremities: Edema: None  Fetal Status: Fetal Heart Rate (bpm): 148 Fundal Height: 37 cm Movement: Present Presentation: Vertex  Results for orders placed or performed in visit on 02/25/18 (from the past 24 hour(s))  POC Urinalysis Dipstick OB   Collection Time: 02/25/18  9:16 AM  Result Value Ref Range   Color, UA     Clarity, UA     Glucose, UA Negative Negative   Bilirubin, UA     Ketones, UA neg    Spec Grav, UA     Blood, UA neg    pH, UA     POC,PROTEIN,UA Negative Negative, Trace, Small (1+), Moderate (2+), Large (3+), 4+   Urobilinogen, UA     Nitrite, UA neg    Leukocytes, UA Negative Negative   Appearance     Odor       Orders Placed This Encounter  Procedures  . GC/Chlamydia Probe Amp([facility])  . Culture, beta strep (group b only)  . POC Urinalysis Dipstick OB    Plan:  Continued routine obstetrical care, is self pay, Dr. Alvester Morin discussed q 2 week  visits.  BP precautions discussed   Return in about 2 weeks (around 03/11/2018) for LROB.

## 2018-02-26 ENCOUNTER — Encounter: Payer: Self-pay | Admitting: Obstetrics & Gynecology

## 2018-02-28 LAB — CULTURE, BETA STREP (GROUP B ONLY): Strep Gp B Culture: POSITIVE — AB

## 2018-03-02 LAB — GC/CHLAMYDIA PROBE AMP
Chlamydia trachomatis, NAA: NEGATIVE
Neisseria gonorrhoeae by PCR: NEGATIVE

## 2018-03-11 ENCOUNTER — Ambulatory Visit (INDEPENDENT_AMBULATORY_CARE_PROVIDER_SITE_OTHER): Payer: Self-pay | Admitting: Women's Health

## 2018-03-11 ENCOUNTER — Encounter: Payer: Self-pay | Admitting: Women's Health

## 2018-03-11 VITALS — BP 119/76 | HR 86 | Wt 188.0 lb

## 2018-03-11 DIAGNOSIS — Z331 Pregnant state, incidental: Secondary | ICD-10-CM

## 2018-03-11 DIAGNOSIS — Z1389 Encounter for screening for other disorder: Secondary | ICD-10-CM

## 2018-03-11 DIAGNOSIS — O368130 Decreased fetal movements, third trimester, not applicable or unspecified: Secondary | ICD-10-CM

## 2018-03-11 DIAGNOSIS — Z3483 Encounter for supervision of other normal pregnancy, third trimester: Secondary | ICD-10-CM

## 2018-03-11 DIAGNOSIS — Z3A39 39 weeks gestation of pregnancy: Secondary | ICD-10-CM

## 2018-03-11 LAB — POCT URINALYSIS DIPSTICK OB
Blood, UA: NEGATIVE
Glucose, UA: NEGATIVE
Ketones, UA: NEGATIVE
Leukocytes, UA: NEGATIVE
Nitrite, UA: NEGATIVE
POC,PROTEIN,UA: NEGATIVE

## 2018-03-11 NOTE — Patient Instructions (Signed)
Jamie Lutz, I greatly value your feedback.  If you receive a survey following your visit with [place] today, we appreciate you taking the time to fill it out.  Thanks, Jamie Lutz, CNM, [facility]  [facility] IS MOVING!!! to [facility] ([address] [place], [place] [address]) on Sunday March 14, 2018 at 5:30am It will be called the [facility], and it is located off of [address]. DO NOT GO TO [address] (the current [facility]) on February 23rd or after, no one will be there!     Call the office [phone]) or go to [facility] if:  You begin to have strong, frequent contractions  Your water breaks.  Sometimes it is a big gush of fluid, sometimes it is just a trickle that keeps getting your panties wet or running down your legs  You have vaginal bleeding.  It is normal to have a small amount of spotting if your cervix was checked.   You don't feel your baby moving like normal.  If you don't, get you something to eat and drink and lay down and focus on feeling your baby move.  You should feel at least 10 movements in 2 hours.  If you don't, you should call the office or go to [facility].     [facility] Contractions Contractions of the uterus can occur throughout pregnancy, but they are not always a sign that you are in labor. You may have practice contractions called Braxton Hicks contractions. These false labor contractions are sometimes confused with true labor. What are Jamie Lutz contractions? Braxton Hicks contractions are tightening movements that occur in the muscles of the uterus before labor. Unlike true labor contractions, these contractions do not result in opening (dilation) and thinning of the cervix. Toward the end of pregnancy (32-34 weeks), Braxton Hicks contractions can happen more often and may become stronger. These contractions are sometimes difficult to tell apart from true labor because they can be very  uncomfortable. You should not feel embarrassed if you go to the hospital with false labor. Sometimes, the only way to tell if you are in true labor is for your health care provider to look for changes in the cervix. The health care provider will do a physical exam and may monitor your contractions. If you are not in true labor, the exam should show that your cervix is not dilating and your water has not broken. If there are no other health problems associated with your pregnancy, it is completely safe for you to be sent home with false labor. You may continue to have Braxton Hicks contractions until you go into true labor. How to tell the difference between true labor and false labor True labor  Contractions last 30-70 seconds.  Contractions become very regular.  Discomfort is usually felt in the top of the uterus, and it spreads to the lower abdomen and low back.  Contractions do not go away with walking.  Contractions usually become more intense and increase in frequency.  The cervix dilates and gets thinner. False labor  Contractions are usually shorter and not as strong as true labor contractions.  Contractions are usually irregular.  Contractions are often felt in the front of the lower abdomen and in the groin.  Contractions may go away when you walk around or change positions while lying down.  Contractions get weaker and are shorter-lasting as time goes on.  The cervix usually does not dilate  or become thin. Follow these instructions at home:   Take over-the-counter and prescription medicines only as told by your health care provider.  Keep up with your usual exercises and follow other instructions from your health care provider.  Eat and drink lightly if you think you are going into labor.  If Braxton Hicks contractions are making you uncomfortable: ? Change your position from lying down or resting to walking, or change from walking to resting. ? Sit and rest in a  tub of warm water. ? Drink enough fluid to keep your urine pale yellow. Dehydration may cause these contractions. ? Do slow and deep breathing several times an hour.  Keep all follow-up prenatal visits as told by your health care provider. This is important. Contact a health care provider if:  You have a fever.  You have continuous pain in your abdomen. Get help right away if:  Your contractions become stronger, more regular, and closer together.  You have fluid leaking or gushing from your vagina.  You pass blood-tinged mucus (bloody show).  You have bleeding from your vagina.  You have low back pain that you never had before.  You feel your baby's head pushing down and causing pelvic pressure.  Your baby is not moving inside you as much as it used to. Summary  Contractions that occur before labor are called Braxton Hicks contractions, false labor, or practice contractions.  Braxton Hicks contractions are usually shorter, weaker, farther apart, and less regular than true labor contractions. True labor contractions usually become progressively stronger and regular, and they become more frequent.  Manage discomfort from [facility] contractions by changing position, resting in a warm bath, drinking plenty of water, or practicing deep breathing. This information is not intended to replace advice given to you by your health care provider. Make sure you discuss any questions you have with your health care provider. Document Released: 05/22/2016 Document Revised: 10/21/2016 Document Reviewed: 05/22/2016 Elsevier Interactive Patient Education  2019 [employer].

## 2018-03-11 NOTE — Progress Notes (Signed)
   LOW-RISK PREGNANCY VISIT Patient name: Jamie Lutz MRN [id number]  Date of birth: Oct 14, 1995 Chief Complaint:   Routine Prenatal Visit (contractions off and on)  History of Present Illness:   Jamie Lutz is a 23 y.o. [phone] female at [redacted]w[redacted]d with an Estimated Date of Delivery: 03/16/18 being seen today for ongoing management of a low-risk pregnancy.  Today she reports some decreased fm last few days. Contractions: Irregular. Vag. Bleeding: None.  Movement: (!) Decreased. denies leaking of fluid. Review of Systems:   Pertinent items are noted in HPI Denies abnormal vaginal discharge w/ itching/odor/irritation, headaches, visual changes, shortness of breath, chest pain, abdominal pain, severe nausea/vomiting, or problems with urination or bowel movements unless otherwise stated above. Pertinent History Reviewed:  Reviewed past medical,surgical, social, obstetrical and family history.  Reviewed problem list, medications and allergies. Physical Assessment:   Vitals:   03/11/18 1042  BP: 119/76  Pulse: 86  Weight: 188 lb (85.3 kg)  Body mass index is 32.27 kg/m.        Physical Examination:   General appearance: Well appearing, and in no distress  Mental status: Alert, oriented to person, place, and time  Skin: Warm & dry  Cardiovascular: Normal heart rate noted  Respiratory: Normal respiratory effort, no distress  Abdomen: Soft, gravid, nontender  Pelvic: Cervical exam performed  Dilation: 1 Effacement (%): Thick Station: -3  Extremities: Edema: Trace  Fetal Status:   Fundal Height: 38 cm Movement: (!) Decreased Presentation: Vertex  NST: FHR baseline 140 bpm, Variability: moderate, Accelerations:present, Decelerations:  Absent= Cat 1/Reactive Toco: irregular, mild    Results for orders placed or performed in visit on 03/11/18 (from the past 24 hour(s))  POC Urinalysis Dipstick OB   Collection Time: 03/11/18 10:44 AM  Result Value Ref Range   Color, UA     Clarity, UA     Glucose, UA Negative Negative   Bilirubin, UA     Ketones, UA neg    Spec Grav, UA     Blood, UA neg    pH, UA     POC,PROTEIN,UA Negative Negative, Trace, Small (1+), Moderate (2+), Large (3+), 4+   Urobilinogen, UA     Nitrite, UA neg    Leukocytes, UA Negative Negative   Appearance     Odor      Assessment & Plan:  1) Low-risk pregnancy [id number] at [redacted]w[redacted]d with an Estimated Date of Delivery: 03/16/18   2) Decrease fm, reactive NST, reviewed [facility], reasons to seek care   Meds: No orders of the defined types were placed in this encounter.  Labs/procedures today: sve, nst  Plan:  Continue routine obstetrical care   Reviewed: Term labor symptoms and general obstetric precautions including but not limited to vaginal bleeding, contractions, leaking of fluid and fetal movement were reviewed in detail with the patient.  All questions were answered  Follow-up: Return in about 1 week (around 03/18/2018) for LROB, NST.  Orders Placed This Encounter  Procedures  . POC Urinalysis Dipstick OB   Cheral Marker CNM, [facility] 03/11/2018 10:59 AM

## 2018-03-13 ENCOUNTER — Encounter (HOSPITAL_COMMUNITY): Payer: Self-pay | Admitting: *Deleted

## 2018-03-13 ENCOUNTER — Inpatient Hospital Stay (HOSPITAL_COMMUNITY)
Admission: AD | Admit: 2018-03-13 | Discharge: 2018-03-15 | DRG: 807 | Disposition: A | Discharge status: Home / Self Care | Payer: Medicaid Other | Attending: Obstetrics & Gynecology | Admitting: Obstetrics & Gynecology

## 2018-03-13 DIAGNOSIS — Z3483 Encounter for supervision of other normal pregnancy, third trimester: Secondary | ICD-10-CM

## 2018-03-13 DIAGNOSIS — O26893 Other specified pregnancy related conditions, third trimester: Secondary | ICD-10-CM | POA: Diagnosis present

## 2018-03-13 DIAGNOSIS — Z3A39 39 weeks gestation of pregnancy: Secondary | ICD-10-CM

## 2018-03-13 DIAGNOSIS — F418 Other specified anxiety disorders: Secondary | ICD-10-CM | POA: Diagnosis present

## 2018-03-13 DIAGNOSIS — Z8632 Personal history of gestational diabetes: Secondary | ICD-10-CM | POA: Diagnosis present

## 2018-03-13 DIAGNOSIS — O99824 Streptococcus B carrier state complicating childbirth: Secondary | ICD-10-CM | POA: Diagnosis present

## 2018-03-13 DIAGNOSIS — B951 Streptococcus, group B, as the cause of diseases classified elsewhere: Secondary | ICD-10-CM | POA: Diagnosis present

## 2018-03-13 DIAGNOSIS — O0932 Supervision of pregnancy with insufficient antenatal care, second trimester: Secondary | ICD-10-CM

## 2018-03-13 LAB — TYPE AND SCREEN
ABO/RH(D): O POS
Antibody Screen: NEGATIVE

## 2018-03-13 LAB — CBC
HCT: 35.2 % — ABNORMAL LOW (ref 36.0–46.0)
Hemoglobin: 11.2 g/dL — ABNORMAL LOW (ref 12.0–15.0)
MCH: 25.7 pg — AB (ref 26.0–34.0)
MCHC: 31.8 g/dL (ref 30.0–36.0)
MCV: 80.9 fL (ref 80.0–100.0)
Platelets: 170 10*3/uL (ref 150–400)
RBC: 4.35 MIL/uL (ref 3.87–5.11)
RDW: 16.4 % — ABNORMAL HIGH (ref 11.5–15.5)
WBC: 16.5 10*3/uL — ABNORMAL HIGH (ref 4.0–10.5)
nRBC: 0 % (ref 0.0–0.2)

## 2018-03-13 MED ORDER — ZOLPIDEM TARTRATE 5 MG PO TABS: 5.0000 mg | ORAL_TABLET | Freq: Every evening | ORAL | Status: DC | PRN

## 2018-03-13 MED ORDER — TETANUS-DIPHTH-ACELL PERTUSSIS 5-2.5-18.5 LF-MCG/0.5 IM SUSP: 0.5000 mL | Freq: Once | INTRAMUSCULAR | Status: DC

## 2018-03-13 MED ORDER — ERYTHROMYCIN 5 MG/GM OP OINT
TOPICAL_OINTMENT | OPHTHALMIC | Status: AC
  Filled 2018-03-13: qty 1

## 2018-03-13 MED ORDER — ONDANSETRON HCL 4 MG/2ML IJ SOLN: 4.0000 mg | Freq: Four times a day (QID) | INTRAMUSCULAR | Status: DC | PRN

## 2018-03-13 MED ORDER — SOD CITRATE-CITRIC ACID 500-334 MG/5ML PO SOLN: 30.0000 mL | ORAL | Status: DC | PRN

## 2018-03-13 MED ORDER — FLEET ENEMA 7-19 GM/118ML RE ENEM: 1.0000 | ENEMA | RECTAL | Status: DC | PRN

## 2018-03-13 MED ORDER — OXYCODONE HCL 5 MG PO TABS: 5.0000 mg | ORAL_TABLET | ORAL | Status: DC | PRN

## 2018-03-13 MED ORDER — DIBUCAINE 1 % RE OINT: 1.0000 "application " | TOPICAL_OINTMENT | RECTAL | Status: DC | PRN

## 2018-03-13 MED ORDER — COCONUT OIL OIL: 1.0000 "application " | TOPICAL_OIL | Status: DC | PRN

## 2018-03-13 MED ORDER — WITCH HAZEL-GLYCERIN EX PADS: 1.0000 "application " | MEDICATED_PAD | CUTANEOUS | Status: DC | PRN

## 2018-03-13 MED ORDER — ONDANSETRON HCL 4 MG PO TABS: 4.0000 mg | ORAL_TABLET | ORAL | Status: DC | PRN

## 2018-03-13 MED ORDER — LACTATED RINGERS IV SOLN: 500.0000 mL | INTRAVENOUS | Status: DC | PRN

## 2018-03-13 MED ORDER — OXYCODONE-ACETAMINOPHEN 5-325 MG PO TABS: 1.0000 | ORAL_TABLET | ORAL | Status: DC | PRN

## 2018-03-13 MED ORDER — BENZOCAINE-MENTHOL 20-0.5 % EX AERO
1.0000 "application " | INHALATION_SPRAY | CUTANEOUS | Status: DC | PRN
  Administered 2018-03-13: 1 via TOPICAL
  Filled 2018-03-13: qty 56

## 2018-03-13 MED ORDER — OXYTOCIN 40 UNITS IN NORMAL SALINE INFUSION - SIMPLE MED: 2.5000 [IU]/h | INTRAVENOUS | Status: DC

## 2018-03-13 MED ORDER — SIMETHICONE 80 MG PO CHEW: 80.0000 mg | CHEWABLE_TABLET | ORAL | Status: DC | PRN

## 2018-03-13 MED ORDER — SENNOSIDES-DOCUSATE SODIUM 8.6-50 MG PO TABS
2.0000 | ORAL_TABLET | ORAL | Status: DC
  Administered 2018-03-14 (×2): 2 via ORAL
  Filled 2018-03-13 (×2): qty 2

## 2018-03-13 MED ORDER — LIDOCAINE HCL (PF) 1 % IJ SOLN: 30.0000 mL | INTRAMUSCULAR | Status: DC | PRN

## 2018-03-13 MED ORDER — LACTATED RINGERS IV SOLN: INTRAVENOUS | Status: DC

## 2018-03-13 MED ORDER — SODIUM CHLORIDE 0.9 % IV SOLN
2.0000 g | Freq: Once | INTRAVENOUS | Status: DC
  Filled 2018-03-13: qty 2000

## 2018-03-13 MED ORDER — OXYTOCIN 40 UNITS IN NORMAL SALINE INFUSION - SIMPLE MED
INTRAVENOUS | Status: AC
  Filled 2018-03-13: qty 1000

## 2018-03-13 MED ORDER — ACETAMINOPHEN 325 MG PO TABS: 650.0000 mg | ORAL_TABLET | ORAL | Status: DC | PRN

## 2018-03-13 MED ORDER — IBUPROFEN 600 MG PO TABS
600.0000 mg | ORAL_TABLET | Freq: Four times a day (QID) | ORAL | Status: DC
  Administered 2018-03-13 – 2018-03-15 (×9): 600 mg via ORAL
  Filled 2018-03-13 (×8): qty 1

## 2018-03-13 MED ORDER — OXYCODONE HCL 5 MG PO TABS: 10.0000 mg | ORAL_TABLET | ORAL | Status: DC | PRN

## 2018-03-13 MED ORDER — PRENATAL MULTIVITAMIN CH
1.0000 | ORAL_TABLET | Freq: Every day | ORAL | Status: DC
  Administered 2018-03-13 – 2018-03-15 (×3): 1 via ORAL
  Filled 2018-03-13 (×3): qty 1

## 2018-03-13 MED ORDER — DIPHENHYDRAMINE HCL 25 MG PO CAPS: 25.0000 mg | ORAL_CAPSULE | Freq: Four times a day (QID) | ORAL | Status: DC | PRN

## 2018-03-13 MED ORDER — FENTANYL CITRATE (PF) 100 MCG/2ML IJ SOLN: 100.0000 ug | INTRAMUSCULAR | Status: DC | PRN

## 2018-03-13 MED ORDER — OXYCODONE-ACETAMINOPHEN 5-325 MG PO TABS: 2.0000 | ORAL_TABLET | ORAL | Status: DC | PRN

## 2018-03-13 MED ORDER — OXYTOCIN BOLUS FROM INFUSION
500.0000 mL | Freq: Once | INTRAVENOUS | Status: AC
  Administered 2018-03-13: 500 mL via INTRAVENOUS

## 2018-03-13 MED ORDER — ACETAMINOPHEN 325 MG PO TABS
650.0000 mg | ORAL_TABLET | ORAL | Status: DC | PRN
  Administered 2018-03-13 – 2018-03-14 (×2): 650 mg via ORAL
  Filled 2018-03-13 (×2): qty 2

## 2018-03-13 MED ORDER — ONDANSETRON HCL 4 MG/2ML IJ SOLN: 4.0000 mg | INTRAMUSCULAR | Status: DC | PRN

## 2018-03-13 NOTE — Progress Notes (Signed)
[name] was referred for history of depression/anxiety. * Referral screened out by Clinical Social Worker because none of the following criteria appear to apply: ~ History of anxiety/depression during this pregnancy, or of post-partum depression following prior delivery. Per OB records review, [name]'s depression/anxiety "doing well,let us know if decides needs meds". ~ Diagnosis of anxiety and/or depression within last 3 years. Per chart review, [name]'s anxiety and depression dates back to 2016. OR * [name]'s symptoms currently being treated with medication and/or therapy. Please contact the Clinical Social Worker if needs arise, by [name] request, or if [name] scores greater than 9/yes to question 10 on Edinburgh Postpartum Depression Screen.  Jamie Everly, LCSW Clinical Social Worker [facility] Cell#: [phone]  

## 2018-03-13 NOTE — MAU Note (Signed)
Urine sent to lab 

## 2018-03-13 NOTE — H&P (Signed)
OBSTETRIC ADMISSION HISTORY AND PHYSICAL  Jamie Lutz is a 23 y.o. female [phone] with IUP at [redacted]w[redacted]d by L/18 week [place] presenting for labor.   Onset of contractions around 2100 last night. +FM. Precipitous delivery in MAU.  She received her prenatal care at [facility].  Support person in labor: father of baby  Ultrasounds . Anatomy U/S: wnl  Prenatal History/Complications: . History of GDM . History of depression with anxiety  Past Medical History: Past Medical History:  Diagnosis Date  . Anxiety   . Diabetes mellitus without complication (HCC)   . Gestational diabetes   . No pertinent past medical history     Past Surgical History: Past Surgical History:  Procedure Laterality Date  . NO PAST SURGERIES    . PERINEAL LACERATION REPAIR  04/17/2011   Procedure: SUTURE REPAIR PERINEAL LACERATION;  Surgeon: Tilda Burrow, MD;  Location: [facility];  Service: Gynecology;  Laterality: N/A;    Obstetrical History: OB History    Gravida  4   Para  3   Term  3   Preterm  0   AB  0   Living  3     SAB  0   TAB  0   Ectopic  0   Multiple  0   Live Births  3           Social History: Social History   Socioeconomic History  . Marital status: Married    Spouse name: Jacquenette Shone  . Number of children: 3  . Years of education: Not on file  . Highest education level: High school graduate  Occupational History  . Not on file  Social Needs  . Financial resource strain: Somewhat hard  . Food insecurity:    Worry: Never true    Inability: Sometimes true  . Transportation needs:    Medical: No    Non-medical: No  Tobacco Use  . Smoking status: Never Smoker  . Smokeless tobacco: Never Used  Substance and Sexual Activity  . Alcohol use: No    Alcohol/week: 0.0 standard drinks  . Drug use: No  . Sexual activity: Yes    Birth control/protection: None  Lifestyle  . Physical activity:    Days per week: 2 days    Minutes per session: 30 min  . Stress:  Rather much  Relationships  . Social connections:    Talks on phone: More than three times a week    Gets together: More than three times a week    Attends religious service: More than 4 times per year    Active member of club or organization: No    Attends meetings of clubs or organizations: Never    Relationship status: Married  Other Topics Concern  . Not on file  Social History Narrative  . Not on file    Family History: Family History  Problem Relation Age of Onset  . Hypothyroidism Mother   . Diabetes Maternal Grandmother   . Cancer Maternal Grandfather        esophagus    Allergies: No Known Allergies  No medications prior to admission.     Review of Systems  All systems reviewed and negative except as stated in HPI  Blood pressure 110/66, pulse 97, temperature 97.7 F (36.5 C), resp. rate 18, height 5\' 4"  (1.626 m), weight 86.2 kg, last menstrual period 06/09/2017, currently breastfeeding. General appearance: alert, cooperative and appears stated age Lungs: no respiratory distress Heart: regular rate  Abdomen:  soft, non-tender; gravid b Pelvic: s/p vaginal delivery Extremities: Homans sign is negative, no sign of DVT Presentation: n/a Fetal monitoring: n/a Uterine activity: n/a Dilation: 6 Effacement (%): 100 Station: -3 Exam by:: Camelia Eng RN  Prenatal labs: ABO, Rh: O/Positive/-- (10/11 1224) Antibody: Negative (12/06 0846) Rubella: 1.03 (10/11 1224) RPR: Non Reactive (12/06 0846)  HBsAg: Negative (10/11 1224)  HIV: Non Reactive (12/06 0846)  GBS:   positive Glucola: negative Genetic screening:  declined  Prenatal Transfer Tool  Maternal Diabetes: No Genetic Screening: Declined Maternal Ultrasounds/Referrals: Normal Fetal Ultrasounds or other Referrals:  None Maternal Substance Abuse:  No Significant Maternal Medications:  None Significant Maternal Lab Results: Lab values include: Group B Strep positive  No results found for this  or any previous visit (from the past 24 hour(s)).  Patient Active Problem List   Diagnosis Date Noted  . Normal labor 03/13/2018  . Supervision of normal pregnancy 10/30/2017  . Late prenatal care in second trimester 10/30/2017  . Depression with anxiety 06/05/2016  . History of gestational diabetes 02/25/2016  . Chest pain 08/28/2015  . Cephalalgia 06/09/2015  . Adjustment disorder with anxious mood 11/03/2014  . 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 Chronic abdominal pain 05/03/2014  . Constipation 05/03/2014  . Paresthesia 02/06/2014    Assessment/Plan:  Jamie Lutz is a 23 y.o. [phone] at [redacted]w[redacted]d here forlabor  Labor: s/p spontaneous vaginal delivery in MAU  Fetal Wellbeing: well-appearing in MAU  Postpartum Planning -- bothunsure -- RI//flu UTD/Tdap UTD  Aura Camps, MD OB/GYN Fellow

## 2018-03-13 NOTE — Lactation Note (Addendum)
This note was copied from a baby's chart. Lactation Consultation Note  Patient Name: Boy Gentry Mlakar [id number] Date: 03/13/2018   g4 p4 mom and this is her 4th boy she reports. Baby boy now almost 8 hours old. Mom reports she did exclusive formula feeding with her first due to school.  Both breastmilk and formula with her second and third.  Her second one close to one year an her third close to 3 months, but always does both. Mom reports she does not feel like she has enough colosotrum.  Mom reports she really wanted to just give colostrum and then planned to switch to formula. Mom breastfeeding on arrival.  Reports comfort.  Infant sleepy at breast.  Urged mom to unswaddle him and make him a little more uncomfortable at breast.  Urged mom to hand express and spoon feed back small drops of colostrum instead of offering formula. Showed mom how to hand express. Urged mom to call lactation as needed.    Maternal Data    Feeding Feeding Type: Bottle Fed - Formula Nipple Type: Slow - flow  LATCH Score Latch: Repeated attempts needed to sustain latch, nipple held in mouth throughout feeding, stimulation needed to elicit sucking reflex.  Audible Swallowing: A few with stimulation  Type of Nipple: Everted at rest and after stimulation  Comfort (Breast/Nipple): Soft / non-tender  Hold (Positioning): Assistance needed to correctly position infant at breast and maintain latch.  LATCH Score: 7  Interventions    Lactation Tools Discussed/Used     Consult Status      Seymour Pavlak Michaelle Copas 03/13/2018, 3:31 PM

## 2018-03-13 NOTE — Discharge Summary (Signed)
Obstetrics Discharge Summary OB/GYN Faculty Practice   Patient Name: Jamie Lutz DOB: 12-16-1995 MRN: [id number]  Date of admission: 03/13/2018 Delivering MD: Aura Camps L   Date of discharge: 03/15/2018  Admitting diagnosis: 39 wks ctx Intrauterine pregnancy: [redacted]w[redacted]d     Secondary diagnosis:   Principal Problem:   Precipitous delivery Active Problems:   History of gestational diabetes   Depression with anxiety   Late prenatal care in second trimester   Normal labor   Positive GBS test   NSVD (normal spontaneous vaginal delivery)      Discharge diagnosis: Term Pregnancy Delivered                                            Postpartum procedures: None  Complications: precipitous delivery  Outpatient Follow-Up: FT for pp, HD for IUD  Hospital course: Jamie Lutz is a 23 y.o. [redacted]w[redacted]d who was admitted for labor. Her pregnancy was complicated by history of GDM, history of anxiety/depression. Her labor course was notable for rapid progression from latent to active labor and precipitous delivery in MAU. Delivery was complicated by none. Please see delivery/op note for additional details. Her postpartum course was uncomplicated. She was breastfeeding without difficulty. By day of discharge, she was passing flatus, urinating, eating and drinking without difficulty. Her pain was well-controlled, and she was discharged home with ibuprofen. She will follow-up in clinic in 4 weeks.   Physical exam  Vitals:   03/14/18 0412 03/14/18 0845 03/14/18 2155 03/15/18 0539  BP: (!) 95/59 103/62 108/78 105/61  Pulse: 74 77 80 92  Resp: 16 19 18 14   Temp: (!) 97.2 F (36.2 C) 98.6 F (37 C) 97.8 F (36.6 C) 98.7 F (37.1 C)  TempSrc: Oral Oral Oral Oral  SpO2: 100%     Weight:      Height:       General: WDWNF Lochia: appropriate Uterine Fundus: firm Incision: N/A DVT Evaluation: No evidence of DVT seen on physical exam. Negative Homan's sign. No cords or calf  tenderness. Labs: Lab Results  Component Value Date   WBC 16.5 (H) 03/13/2018   HGB 11.2 (L) 03/13/2018   HCT 35.2 (L) 03/13/2018   MCV 80.9 03/13/2018   PLT 170 03/13/2018   CMP Latest Ref Rng & Units 02/10/2018  Glucose 70 - 99 mg/dL 606(Y)  BUN 6 - 20 mg/dL 9  Creatinine 0.45 - 9.97 mg/dL 7.41  Sodium 423 - 953 mmol/L 134(L)  Potassium 3.5 - 5.1 mmol/L 3.6  Chloride 98 - 111 mmol/L 105  CO2 22 - 32 mmol/L 20(L)  Calcium 8.9 - 10.3 mg/dL 8.2(L)  Total Protein 6.5 - 8.1 g/dL 6.2(L)  Total Bilirubin 0.3 - 1.2 mg/dL 0.5  Alkaline Phos 38 - 126 U/L 150(H)  AST 15 - 41 U/L 14(L)  ALT 0 - 44 U/L 11    Discharge instructions: Per After Visit Summary and "Baby and Me Booklet"  After visit meds:  Allergies as of 03/15/2018   No Known Allergies     Medication List    TAKE these medications   ibuprofen 600 MG tablet Commonly known as:  ADVIL,MOTRIN Take 1 tablet (600 mg total) by mouth every 6 (six) hours.       Postpartum contraception: IUD Mirena Diet: Routine Diet Activity: Advance as tolerated. Pelvic rest for 6 weeks.   Follow-up Appt: Future Appointments  Date Time Provider Department Center  03/18/2018  9:15 AM Cresenzo-Dishmon, Scarlette Calico, CNM [facility]   Follow-up Visit:No follow-ups on file.  Newborn Data: Live born female  Birth Weight:   APGAR: 9, 9  Newborn Delivery   Birth date/time:  03/13/2018 05:14:00 Delivery type:  Vaginal, Spontaneous     Baby Feeding: Bottle and Breast Disposition:home with mother  Aura Camps, MD OB/GYN Fellow, Faculty Practice  Jacklyn Shell

## 2018-03-13 NOTE — MAU Note (Signed)
Ctx since 9pm about 5-6 min apart now. Denies any vag bleedng or leaking. Good fetal movement reprted.

## 2018-03-14 LAB — TYPE AND SCREEN
ABO/RH(D): O POS
Antibody Screen: NEGATIVE

## 2018-03-14 LAB — RPR: RPR Ser Ql: NONREACTIVE

## 2018-03-14 LAB — ABO/RH: ABO/RH(D): O POS

## 2018-03-14 NOTE — Lactation Note (Signed)
This note was copied from a baby's chart. Lactation Consultation Note  Patient Name: Jamie Lutz [id number] Date: 03/14/2018 Reason for consult: Follow-up assessment   P4, Baby 26 hours old and latched upon entering with intermittent swallows. Baby came off during consult and latched back on easily. Nipples evert and compressible. Mother is supplementing with formula afterwards.  Denies questions and concerns. Feed on demand approximately 8-12 times per day and knows to unwrap baby to wake if needed.    Maternal Data    Feeding Feeding Type: Breast Fed  LATCH Score Latch: Grasps breast easily, tongue down, lips flanged, rhythmical sucking.  Audible Swallowing: A few with stimulation  Type of Nipple: Everted at rest and after stimulation  Comfort (Breast/Nipple): Soft / non-tender  Hold (Positioning): Assistance needed to correctly position infant at breast and maintain latch.  LATCH Score: 8  Interventions    Lactation Tools Discussed/Used     Consult Status Consult Status: Follow-up Date: 03/15/18 Follow-up type: In-patient    Dahlia Byes [facility] 03/14/2018, 7:50 AM

## 2018-03-14 NOTE — Progress Notes (Signed)
CSW received consult due to score 10 on Edinburgh Depression Screen.    When CSW arrived, [name] was resting in bed, FOB was asleep on the couch, and infant was asleep in bassinet.  [name] gave CSW permission to complete the assessment while FOB was asleep on the couch.   CSW reviewed [name]'s Edinburgh score and provided education regarding the baby blues period vs. perinatal mood disorders.  CSW discussed treatment and gave resources for mental health follow up if concerns arise.  CSW recommends self-evaluation during the postpartum time period using the New Mom Checklist from Postpartum Progress and encouraged [name] to contact a medical professional if symptoms are noted at any time.  CSW assessed for safety and [name] denied SI and HI.  [name] reported having a good support team that consists of FOB and [name]'s sister.   [name] also reported having all essential items for infant and feeling prepared to parent.   CSW provided review of Sudden Infant Death Syndrome (SIDS) precautions.    CSW identifies no further need for intervention and no barriers to discharge at this time.  Blaine Hamper, MSW, LCSW Clinical Social Work [phone]

## 2018-03-14 NOTE — Progress Notes (Signed)
Post Partum Day 1 Subjective: no complaints, up ad lib, voiding and tolerating PO  Objective: Blood pressure (!) 95/59, pulse 74, temperature (!) 97.2 F (36.2 C), temperature source Oral, resp. rate 16, height 5\' 4"  (1.626 m), weight 86.2 kg, last menstrual period 06/09/2017, SpO2 100 %, unknown if currently breastfeeding.  Physical Exam:  General: alert, cooperative and no distress Lochia: appropriate Uterine Fundus: firm Incision: n/a DVT Evaluation: No evidence of DVT seen on physical exam.  Recent Labs    03/13/18 0709  HGB 11.2*  HCT 35.2*    Assessment/Plan: Plan for discharge tomorrow, Breastfeeding and Contraception undecided   LOS: 1 day   Wynelle Bourgeois 03/14/2018, 6:20 AM

## 2018-03-15 MED ORDER — IBUPROFEN 600 MG PO TABS: 600.0000 mg | ORAL_TABLET | Freq: Four times a day (QID) | ORAL | 0 refills | Status: DC

## 2018-03-15 NOTE — Discharge Instructions (Signed)

## 2018-03-15 NOTE — Lactation Note (Signed)
This note was copied from a baby's chart. Lactation Consultation Note  Patient Name: Jamie Lutz Date: 03/15/2018   P4, Baby 51 hours old.  Mother breastfeeding and giving baby formula. Encouraged mother to breastfeed before offering formula. Feed on demand approximately 8-12 times per day.   Reviewed engorgement care and monitoring voids/stools. Mother denies questions or concerns.     Maternal Data    Feeding Feeding Type: Breast Fed  LATCH Score Latch: Repeated attempts needed to sustain latch, nipple held in mouth throughout feeding, stimulation needed to elicit sucking reflex.  Audible Swallowing: Spontaneous and intermittent  Type of Nipple: Everted at rest and after stimulation  Comfort (Breast/Nipple): Filling, red/small blisters or bruises, mild/mod discomfort  Hold (Positioning): No assistance needed to correctly position infant at breast.(I came in at the end of feed, Mom reported this info)  LATCH Score: 8  Interventions    Lactation Tools Discussed/Used     Consult Status      Jamie Lutz 03/15/2018, 9:00 AM

## 2018-03-18 ENCOUNTER — Other Ambulatory Visit: Payer: Self-pay | Admitting: Advanced Practice Midwife

## 2018-03-30 IMAGING — US US ABDOMEN LIMITED
1 series · 14 of 25 positions shown · non-contrast
Comparison: None.

CLINICAL DATA: Right upper quadrant pain for several days.

EXAM:
ULTRASOUND ABDOMEN LIMITED RIGHT UPPER QUADRANT

[Series 1: us abdomen limited · 0.22mm/px · 14 of 35 slices shown]
[im 1/35]
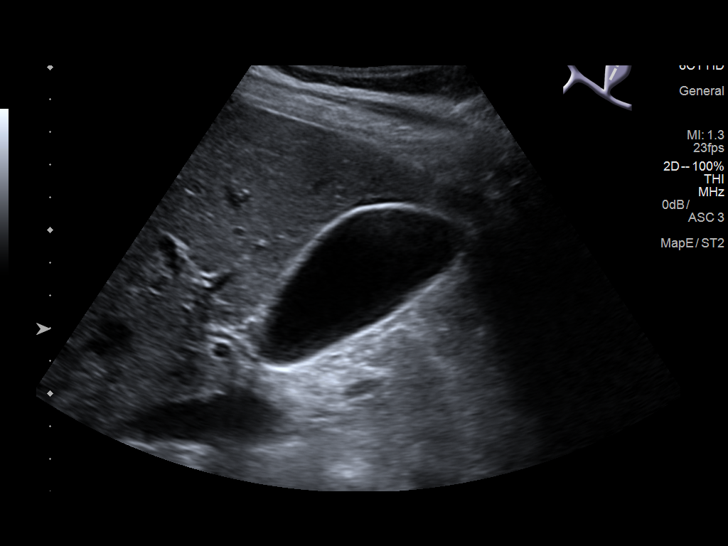
[im 3/35]
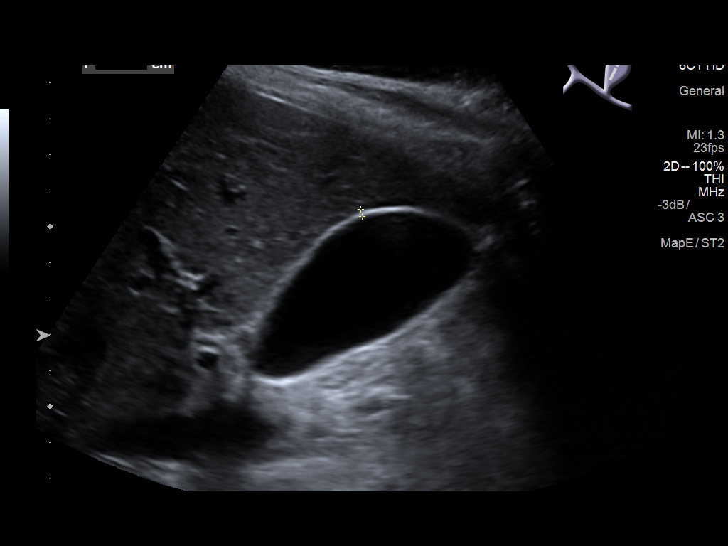
[im 6/35]
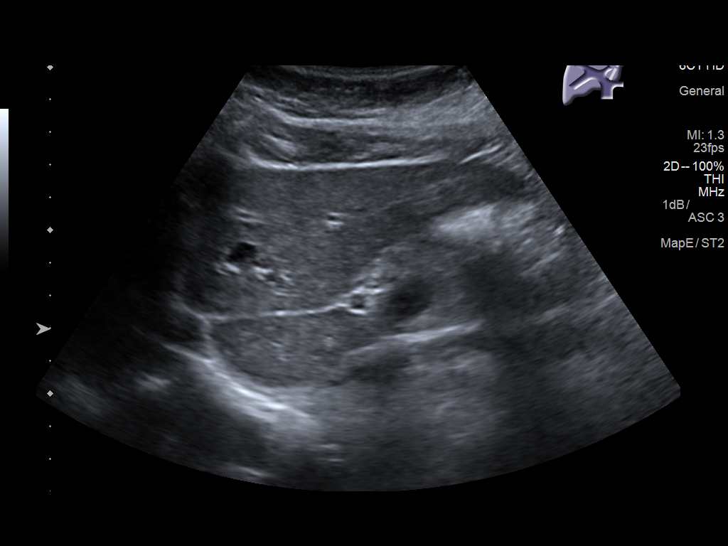
[im 9/35]
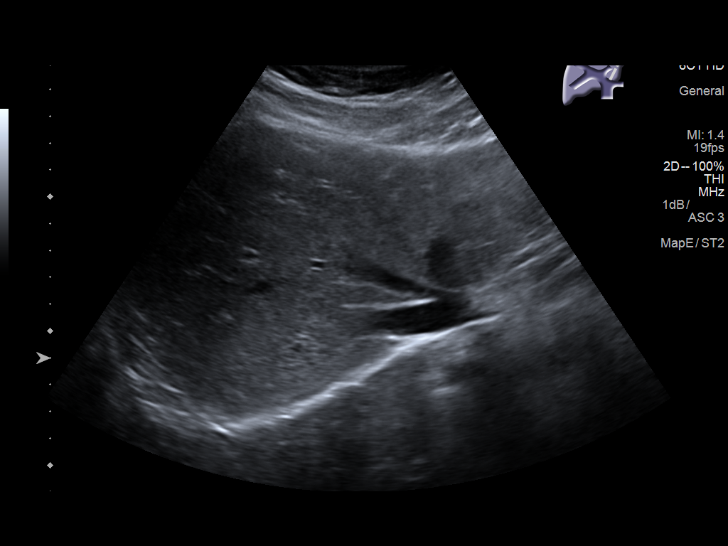
[im 12/35]
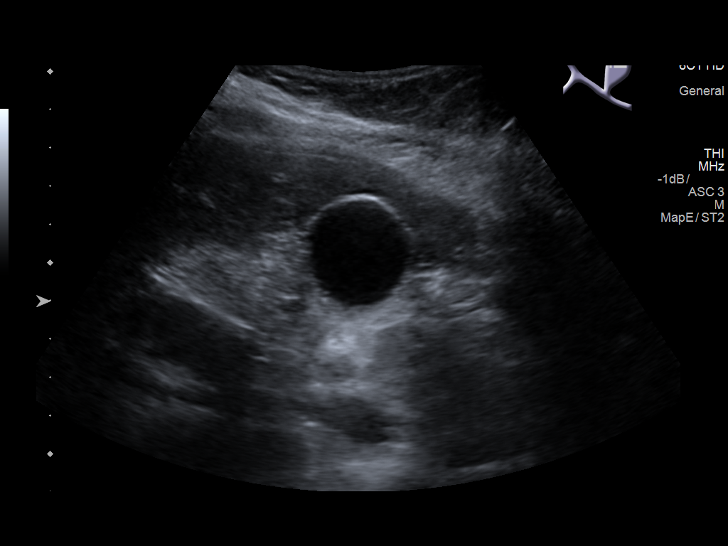
[im 13/35]
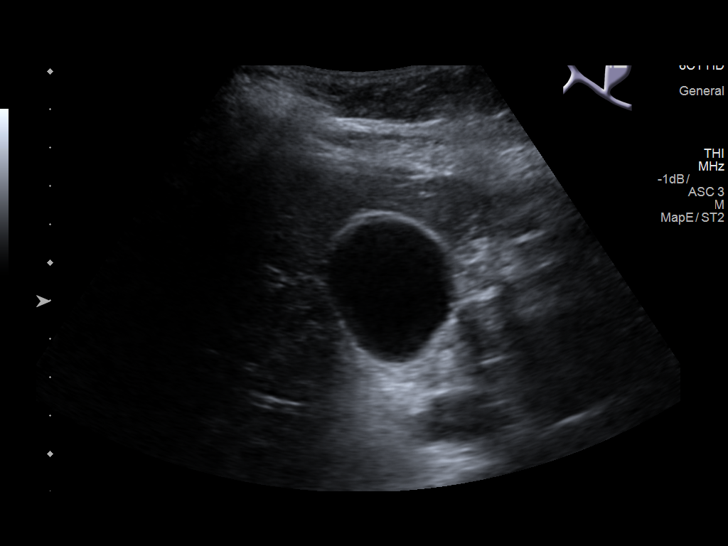
[im 16/35]
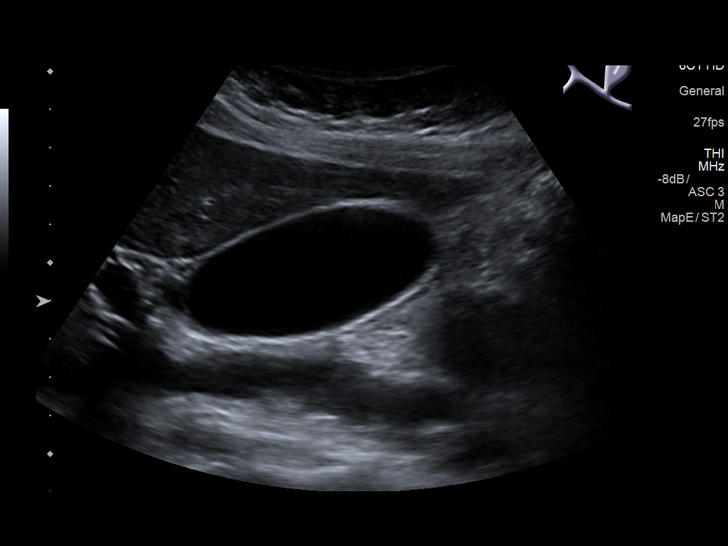
[im 19/35]
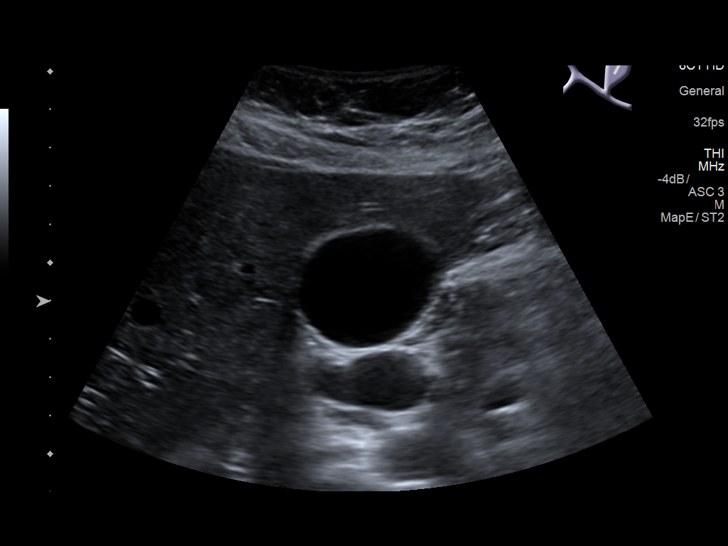
[im 22/35]
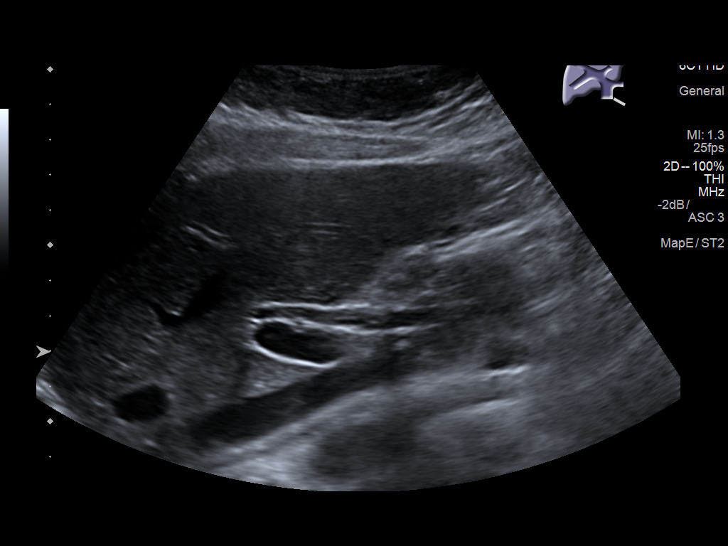
[im 23/35]
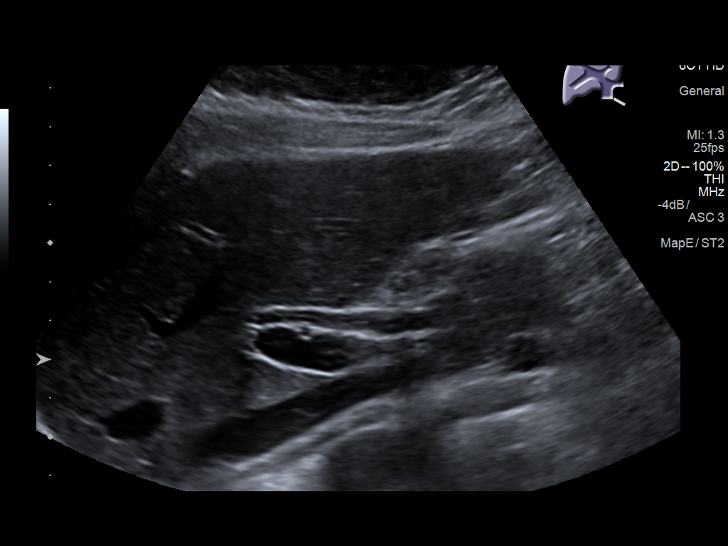
[im 26/35]
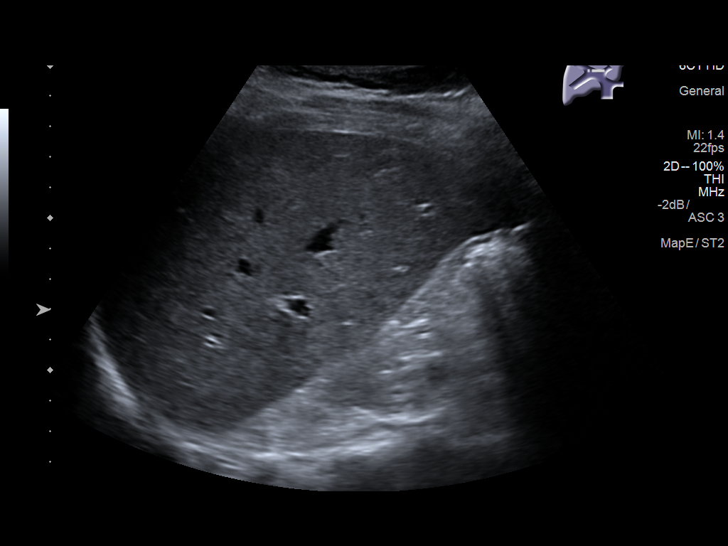
[im 29/35]
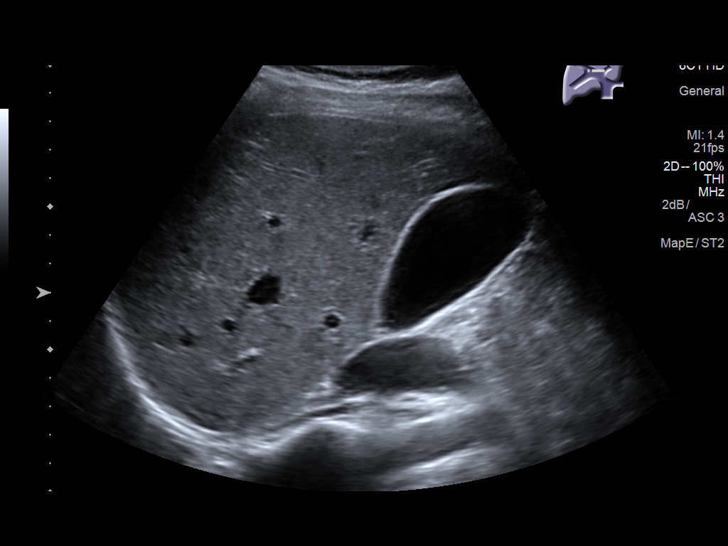
[im 32/35]
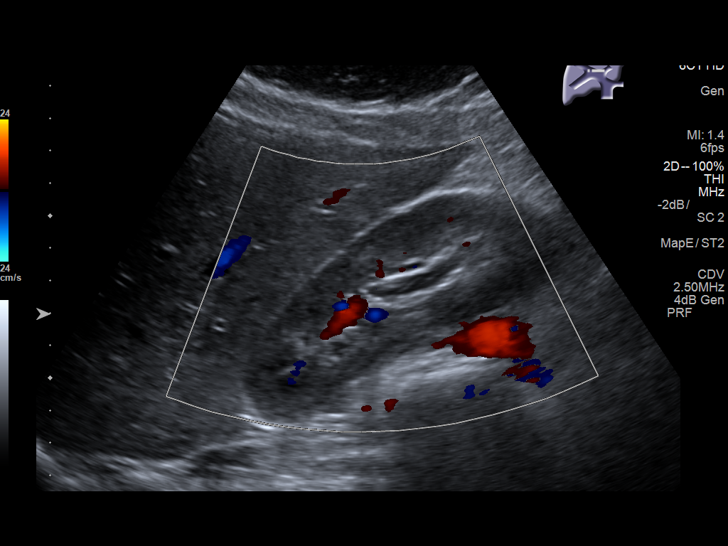
[im 35/35]
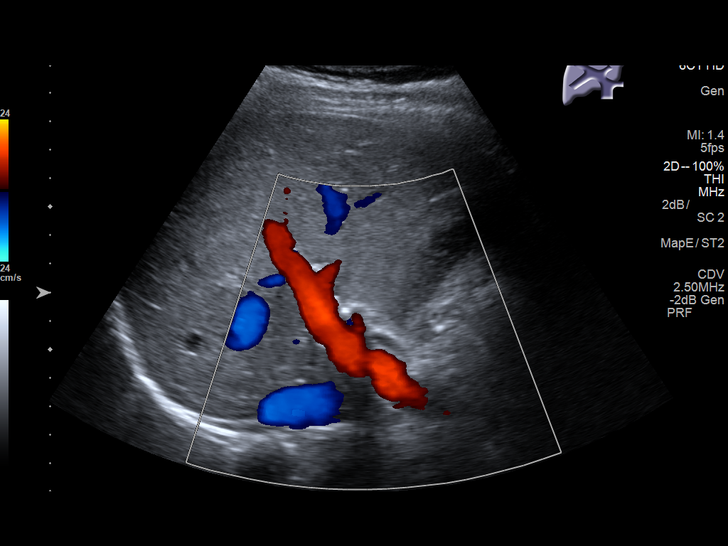

[14 of 25 positions shown; findings below may reference images not displayed]

FINDINGS: Gallbladder:

No gallstones or wall thickening visualized. No sonographic Murphy
sign noted by sonographer.

Common bile duct:

Diameter: 4 mm

Liver:

No focal lesion identified. Within normal limits in parenchymal
echogenicity. Portal vein is patent on color Doppler imaging with
normal direction of blood flow towards the liver.

Incidental note of mild right renal pelviectasis.
IMPRESSION: Incidental note of mild right renal pelviectasis.

Normal appearance of the liver and gallbladder.

## 2018-04-20 ENCOUNTER — Ambulatory Visit (INDEPENDENT_AMBULATORY_CARE_PROVIDER_SITE_OTHER): Payer: Self-pay | Admitting: Women's Health

## 2018-04-20 ENCOUNTER — Other Ambulatory Visit: Payer: Self-pay

## 2018-04-20 ENCOUNTER — Encounter: Payer: Self-pay | Admitting: Women's Health

## 2018-04-20 DIAGNOSIS — L509 Urticaria, unspecified: Secondary | ICD-10-CM

## 2018-04-20 DIAGNOSIS — J029 Acute pharyngitis, unspecified: Secondary | ICD-10-CM

## 2018-04-20 DIAGNOSIS — K5900 Constipation, unspecified: Secondary | ICD-10-CM

## 2018-04-20 NOTE — Progress Notes (Signed)
TELEHEALTH VIRTUAL POSTPARTUM VISIT ENCOUNTER NOTE  I connected with@ on 04/20/18 at  2:30 PM EDT by telephone at home and verified that I am speaking with the correct person using two identifiers.   I discussed the limitations, risks, security and privacy concerns of performing an evaluation and management service by telephone and the availability of in person appointments. I also discussed with the patient that there may be a patient responsible charge related to this service. The patient expressed understanding and agreed to proceed.  Appointment Date: 04/20/2018  OBGYN Clinic: [facility]  Chief Complaint:  Chief Complaint  Patient presents with  . postpartum visit    + hives mostly on arms, shoulders and face; + sorethroat x 1 month; + cramping and muscle pain     History of Present Illness: Jamie Lutz is a 23 y.o. Hispanic [id number] (No LMP recorded.), seen for the above chief complaint. Her past medical history is significant for dep/anx-no meds.   She is s/p normal spontaneous vaginal delivery on 03/13/18 at 39.4 weeks, precipitous delivery in MAU; she was discharged to home on PPD#2. Pregnancy complicated by nothing. Baby is doing well.  Complains of hives since just before delivery of baby (sent mychart pics earlier), sore throat x - itchy, no fever/chills/cough, etc. Muscle pain.   Vaginal bleeding or discharge: light lochia Mode of feeding infant: Breast Intercourse: No  Contraception: undecided, probably condoms PP depression s/s: No  Has had panic attacks over Covid19, states overall doing well, h/o dep/anx- no meds. Denies SI/HI/II. Any bowel or bladder issues: some constipation Pap smear: no abnormalities (date: 11/15/15)  Review of Systems: Positive for constipation, rash, sore throat, muscle pain. Her 12 point review of systems is negative or as noted in the History of Present Illness.  Patient Active Problem List   Diagnosis Date Noted  . Depression with  anxiety 06/05/2016  . History of gestational diabetes 02/25/2016  . Adjustment disorder with anxious mood 11/03/2014  . 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 Chronic abdominal pain 05/03/2014  . Constipation 05/03/2014  . Paresthesia 02/06/2014    Medications Jamie Lutz had no medications administered during this visit. Current Outpatient Medications  Medication Sig Dispense Refill  . Ascorbic Acid (VITAMIN C PO) Take by mouth daily.    Marland Kitchen ibuprofen (ADVIL,MOTRIN) 600 MG tablet Take 1 tablet (600 mg total) by mouth every 6 (six) hours. (Patient taking differently: Take 600 mg by mouth as needed. ) 30 tablet 0  . Prenatal Vit-Fe Fumarate-FA (PRENATAL VITAMIN PO) Take by mouth daily.     No current facility-administered medications for this visit.     Allergies Patient has no known allergies.  Physical Exam:  General:  Alert, oriented and cooperative.   Mental Status: Normal mood and affect perceived. Normal judgment and thought content.  Rest of physical exam deferred due to type of encounter  PP Depression Screening:   Edinburgh Postnatal Depression Scale - 04/20/18 1511      Edinburgh Postnatal Depression Scale:  In the Past 7 Days   I have been able to laugh and see the funny side of things.  0    I have looked forward with enjoyment to things.  0    I have blamed myself unnecessarily when things went wrong.  2    I have been anxious or worried for no good reason.  2    I have felt scared or panicky for no good reason.  2  Things have been getting on top of me.  0    I have been so unhappy that I have had difficulty sleeping.  1    I have felt sad or miserable.  1    I have been so unhappy that I have been crying.  0    The thought of harming myself has occurred to me.  0    Edinburgh Postnatal Depression Scale Total  8       Assessment:Patient is a 23 y.o. [id number] who is 5 weeks postpartum from a normal spontaneous vaginal delivery.  She is doing  well overall.   Plan: Sent mychart msg w/ constipation prevention/relief measures Allergies/hives- take benadryl, claritin or zyrtec Muscle cramps/pains- stay well hydrated, eat bananas- let us know if worsening Panic attacks- coping well, declines need for meds/counseling There are no diagnoses linked to this encounter.  RTC Oct for pap  I discussed the assessment and treatment plan with the patient. The patient was provided an opportunity to ask questions and all were answered. The patient agreed with the plan and demonstrated an understanding of the instructions.   The patient was advised to call back or seek an in-person evaluation/go to the ED for any concerning postpartum symptoms.  I provided 14 minutes of non-face-to-face time during this encounter.   Cheral Marker, CNM Center for [employer], [facility]

## 2018-05-20 ENCOUNTER — Telehealth: Payer: Self-pay | Admitting: Physician Assistant

## 2018-05-20 DIAGNOSIS — J312 Chronic pharyngitis: Secondary | ICD-10-CM

## 2018-05-20 DIAGNOSIS — R1310 Dysphagia, unspecified: Secondary | ICD-10-CM

## 2018-05-20 NOTE — Progress Notes (Signed)
Based on what you shared with me, I feel your condition warrants further evaluation and I recommend that you be seen for a face to face office visit.  Giving symptoms have been present for 2 months and acutely worsened for 2 weeks, you require a good physical exam, throat swabbing and potentially further assessment so that the most appropriate treatment can be given.    NOTE: If you entered your credit card information for this eVisit, you will not be charged. You may see a "hold" on your card for the $35 but that hold will drop off and you will not have a charge processed.  If you are having a true medical emergency please call 911.  If you need an urgent face to face visit, [facility] has four urgent care centers for your convenience.    PLEASE NOTE: THE [facility] LOCATIONS AND URGENT CARE CLINICS DO NOT HAVE THE TESTING FOR CORONAVIRUS COVID19 AVAILABLE.  IF YOU FEEL YOU NEED THIS TEST YOU MUST GO TO A TRIAGE LOCATION AT ONE OF THE HOSPITAL EMERGENCY DEPARTMENTS   [email or web address] to reserve your spot online an avoid wait times  [facility] [address] [place], [place] [address] Modified hours of operation: Monday-Friday, 10 AM to 6 PM  Saturday & Sunday 10 AM to 4 PM *Across the street from [employer]  [employer] (New Address!) [address] [place], [place] [address] *Just off [employer], across the road from [place]* Modified hours of operation: Monday-Friday, 10 AM to 5 PM  Closed Saturday & Sunday   The following sites will take your insurance:  . [facility]  [phone] Get Driving Directions Find a Provider at this Location  [address] [place], [place] [address] . 10 am to 8 pm Monday-Friday . 12 pm to 8 pm Saturday-Sunday   . [facility] at [facility]  [phone] Get Driving Directions Find a Provider at this Location  [address] [place], [place] [address] . 8 am to 8 pm Monday-Friday . 9 am to 6 pm Saturday . 11 am to 6 pm Sunday   . [facility] at [facility]  [phone] Get Driving Directions  [address] [place], [place] [address] . 8 am to 8 pm Monday-Friday . 8 am to 4 pm Saturday-Sunday   Your e-visit answers were reviewed by a board certified advanced clinical practitioner to complete your personal care plan.  Thank you for using e-Visits.

## 2018-05-21 ENCOUNTER — Encounter (HOSPITAL_COMMUNITY): Payer: Self-pay

## 2018-05-21 ENCOUNTER — Ambulatory Visit (HOSPITAL_COMMUNITY)
Admission: EM | Admit: 2018-05-21 | Discharge: 2018-05-21 | Disposition: A | Discharge status: Home / Self Care | Payer: Self-pay | Attending: Family Medicine | Admitting: Family Medicine

## 2018-05-21 ENCOUNTER — Other Ambulatory Visit: Payer: Self-pay

## 2018-05-21 DIAGNOSIS — M791 Myalgia, unspecified site: Secondary | ICD-10-CM

## 2018-05-21 DIAGNOSIS — J029 Acute pharyngitis, unspecified: Secondary | ICD-10-CM | POA: Insufficient documentation

## 2018-05-21 LAB — POCT RAPID STREP A: Streptococcus, Group A Screen (Direct): NEGATIVE

## 2018-05-21 LAB — TSH: TSH: 0.526 u[IU]/mL (ref 0.350–4.500)

## 2018-05-21 MED ORDER — CETIRIZINE HCL 10 MG PO CAPS: 10.0000 mg | ORAL_CAPSULE | Freq: Every day | ORAL | 0 refills | Status: DC

## 2018-05-21 NOTE — Discharge Instructions (Signed)
Sore Throat  Your rapid strep tested Negative today. We will send for a culture and call in about 2 days if results are positive. Most likely from post nasal drainage. Begin daily cetirizine- please monitor baby, if caousing drowsiness, irritability or jitteryness stop.   Please continue Tylenol or Ibuprofen for fever and pain. May try salt water gargles, cepacol lozenges, throat spray, or OTC cold relief medicine for throat discomfort. If you also have congestion take a daily anti-histamine like Zyrtec, Claritin, and a oral decongestant to help with post nasal drip that may be irritating your throat.   Stay hydrated and drink plenty of fluids to keep your throat coated relieve irritation.

## 2018-05-21 NOTE — ED Triage Notes (Signed)
Pt presents with sore throat and generalized body aches for past 2 days.

## 2018-05-21 NOTE — ED Provider Notes (Signed)
[facility]    CSN: [id number] Arrival date & time: 05/21/18  1224     History   Chief Complaint Chief Complaint  Patient presents with  . Sore Throat  . Generalized Body Aches    HPI Jamie Lutz is a 23 y.o. female history of DM type II, gestational diabetes, presenting today for evaluation of sore throat and body aches.  Patient states that she has had a sore throat for the past 2 months since she delivered her baby.  She states that it has been constant.  Initially felt a lump in her neck which was tender to touch and move her neck.  She also notes that she often feels cold, and occasional heart racing sensation.  Denies congestion, rhinorrhea or cough.  Denies any known fevers.  She has tried some Tylenol for her throat without significant improvement.  HPI  Past Medical History:  Diagnosis Date  . Anxiety   . Diabetes mellitus without complication (HCC)   . Gestational diabetes   . No pertinent past medical history     Patient Active Problem List   Diagnosis Date Noted  . Depression with anxiety 06/05/2016  . History of gestational diabetes 02/25/2016  . Adjustment disorder with anxious mood 11/03/2014  . 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 Chronic abdominal pain 05/03/2014  . Constipation 05/03/2014  . Paresthesia 02/06/2014    Past Surgical History:  Procedure Laterality Date  . NO PAST SURGERIES    . PERINEAL LACERATION REPAIR  04/17/2011   Procedure: SUTURE REPAIR PERINEAL LACERATION;  Surgeon: Tilda Burrow, MD;  Location: [facility];  Service: Gynecology;  Laterality: N/A;    OB History    Gravida  4   Para  4   Term  4   Preterm  0   AB  0   Living  4     SAB  0   TAB  0   Ectopic  0   Multiple  0   Live Births  4            Home Medications    Prior to Admission medications   Medication Sig Start Date End Date Taking? Authorizing Provider  Ascorbic Acid (VITAMIN C PO) Take by mouth daily.     [provider]  Cetirizine HCl 10 MG CAPS Take 1 capsule (10 mg total) by mouth daily for 10 days. 05/21/18 05/31/18  Keerat Denicola C, PA-C  ibuprofen (ADVIL,MOTRIN) 600 MG tablet Take 1 tablet (600 mg total) by mouth every 6 (six) hours. Patient taking differently: Take 600 mg by mouth as needed.  03/15/18   Cresenzo-Dishmon, Scarlette Calico, CNM  Prenatal Vit-Fe Fumarate-FA (PRENATAL VITAMIN PO) Take by mouth daily.    [provider]    Family History Family History  Problem Relation Age of Onset  . Hypothyroidism Mother   . Diabetes Maternal Grandmother   . Cancer Maternal Grandfather        esophagus    Social History Social History   Tobacco Use  . Smoking status: Never Smoker  . Smokeless tobacco: Never Used  Substance Use Topics  . Alcohol use: No    Alcohol/week: 0.0 standard drinks  . Drug use: No     Allergies   Patient has no known allergies.   Review of Systems Review of Systems  Constitutional: Negative for activity change, appetite change, chills, fatigue and fever.  HENT: Positive for sore throat. Negative for congestion, ear pain, rhinorrhea, sinus  pressure and trouble swallowing.   Eyes: Negative for discharge and redness.  Respiratory: Negative for cough, chest tightness and shortness of breath.   Cardiovascular: Positive for palpitations. Negative for chest pain.  Gastrointestinal: Negative for abdominal pain, diarrhea, nausea and vomiting.  Musculoskeletal: Positive for myalgias.  Skin: Negative for rash.  Neurological: Negative for dizziness, light-headedness and headaches.     Physical Exam Triage Vital Signs ED Triage Vitals  Enc Vitals Group     BP 05/21/18 1245 127/83     Pulse Rate 05/21/18 1245 78     Resp 05/21/18 1245 17     Temp 05/21/18 1245 97.7 F (36.5 C)     Temp Source 05/21/18 1245 Oral     SpO2 05/21/18 1245 98 %     Weight --      Height --      Head Circumference --      Peak Flow --      Pain Score  05/21/18 1247 8     Pain Loc --      Pain Edu? --      Excl. in GC? --    No data found.  Updated Vital Signs BP 127/83 (BP Location: Left Arm)   Pulse 78   Temp 97.7 F (36.5 C) (Oral)   Resp 17   SpO2 98%   Visual Acuity Right Eye Distance:   Left Eye Distance:   Bilateral Distance:    Right Eye Near:   Left Eye Near:    Bilateral Near:     Physical Exam Vitals signs and nursing note reviewed.  Constitutional:      General: She is not in acute distress.    Appearance: She is well-developed.  HENT:     Head: Normocephalic and atraumatic.     Ears:     Comments: Bilateral ears without tenderness to palpation of external auricle, tragus and mastoid, EAC's without erythema or swelling, TM's with good bony landmarks and cone of light. Non erythematous.     Mouth/Throat:     Comments: Oral mucosa pink and moist, no tonsillar enlargement or exudate. Posterior pharynx patent, mild erythema, no uvula deviation or swelling. Normal phonation.  Eyes:     Conjunctiva/sclera: Conjunctivae normal.  Neck:     Musculoskeletal: Neck supple.     Comments: Tender to palpation over thyroid, no palpable lump or abnormality  Full active range of motion of neck Cardiovascular:     Rate and Rhythm: Normal rate and regular rhythm.     Heart sounds: No murmur.  Pulmonary:     Effort: Pulmonary effort is normal. No respiratory distress.     Breath sounds: Normal breath sounds.     Comments: Breathing comfortably at rest, CTABL, no wheezing, rales or other adventitious sounds auscultated Abdominal:     Palpations: Abdomen is soft.     Tenderness: There is no abdominal tenderness.  Skin:    General: Skin is warm and dry.  Neurological:     Mental Status: She is alert.      UC Treatments / Results  Labs (all labs ordered are listed, but only abnormal results are displayed) Labs Reviewed  CULTURE, GROUP A STREP ([facility])  TSH  POCT RAPID STREP A    EKG None  Radiology No  results found.  Procedures Procedures (including critical care time)  Medications Ordered in UC Medications - No data to display  Initial Impression / Assessment and Plan / UC Course  I have reviewed  the triage vital signs and the nursing notes.  Pertinent labs & imaging results that were available during my care of the patient were reviewed by me and considered in my medical decision making (see chart for details).     Strep test negative.  Sore throat most likely from postnasal drainage/allergies.  Patient is breast-feeding, advised that she may try daily cetirizine/loratadine to help with irritation.  Did discuss the risks associated with this and breast-feeding, advised to monitor for increased drowsiness, irritability or jitteriness and baby and stop medicine if having.  Obtaining TSH to check thyroid given recent pregnancy, thyroid tenderness as well as sensations of palpitations and temperature irregulations.  Will call patient with results of TSH and provide further treatment if needed.  Discussed establishing care with PCP, for further management if irregular.Discussed strict return precautions. Patient verbalized understanding and is agreeable with plan.  Final Clinical Impressions(s) / UC Diagnoses   Final diagnoses:  Sore throat     Discharge Instructions     Sore Throat  Your rapid strep tested Negative today. We will send for a culture and call in about 2 days if results are positive. Most likely from post nasal drainage. Begin daily cetirizine- please monitor baby, if caousing drowsiness, irritability or jitteryness stop.   Please continue Tylenol or Ibuprofen for fever and pain. May try salt water gargles, cepacol lozenges, throat spray, or OTC cold relief medicine for throat discomfort. If you also have congestion take a daily anti-histamine like Zyrtec, Claritin, and a oral decongestant to help with post nasal drip that may be irritating your throat.   Stay hydrated  and drink plenty of fluids to keep your throat coated relieve irritation.     ED Prescriptions    Medication Sig Dispense Auth. Provider   Cetirizine HCl 10 MG CAPS Take 1 capsule (10 mg total) by mouth daily for 10 days. 10 capsule Annali Lybrand C, PA-C     Controlled Substance Prescriptions [place] Controlled Substance Registry consulted? Not Applicable   Lew Dawes, [place] 05/21/18 1344

## 2018-05-23 LAB — CULTURE, GROUP A STREP (THRC)

## 2019-01-17 ENCOUNTER — Other Ambulatory Visit: Payer: Self-pay

## 2019-01-20 ENCOUNTER — Other Ambulatory Visit: Payer: Self-pay

## 2019-01-20 ENCOUNTER — Telehealth: Payer: Self-pay

## 2019-01-20 ENCOUNTER — Ambulatory Visit
Admission: EM | Admit: 2019-01-20 | Discharge: 2019-01-20 | Disposition: A | Discharge status: Home / Self Care | Payer: Self-pay | Attending: Emergency Medicine | Admitting: Emergency Medicine

## 2019-01-20 DIAGNOSIS — Z20828 Contact with and (suspected) exposure to other viral communicable diseases: Secondary | ICD-10-CM

## 2019-01-20 DIAGNOSIS — Z20822 Contact with and (suspected) exposure to covid-19: Secondary | ICD-10-CM

## 2019-01-20 MED ORDER — FLUTICASONE PROPIONATE 50 MCG/ACT NA SUSP: 2.0000 | Freq: Every day | NASAL | 0 refills | Status: DC

## 2019-01-20 MED ORDER — IBUPROFEN 800 MG PO TABS: 800.0000 mg | ORAL_TABLET | Freq: Three times a day (TID) | ORAL | 0 refills | Status: DC

## 2019-01-20 NOTE — Discharge Instructions (Signed)

## 2019-01-20 NOTE — ED Provider Notes (Signed)
[place]   [id number] 01/20/19 Arrival Time: 4401   CC: COVID symptoms  SUBJECTIVE: History from: patient.  Jamie Lutz is a 23 y.o. female who presents with fever and runny nose x 5 days, tmax of 103 5 days ago, and 100.4 in office today.  Denies sick exposure to COVID, flu or strep.  Denies recent travel.  Has tried tylenol with relief.  Denies aggravating factors.  Denies previous symptoms in the past.   Denies sinus pain, sore throat, cough, SOB, wheezing, chest pain, nausea, changes in bowel or bladder habits.    ROS: As per HPI.  All other pertinent ROS negative.     Past Medical History:  Diagnosis Date  . Anxiety   . Diabetes mellitus without complication ([place])   . Gestational diabetes   . No pertinent past medical history    Past Surgical History:  Procedure Laterality Date  . NO PAST SURGERIES    . PERINEAL LACERATION REPAIR  04/17/2011   Procedure: SUTURE REPAIR PERINEAL LACERATION;  Surgeon: Jonnie Kind, MD;  Location: [facility];  Service: Gynecology;  Laterality: N/A;   No Known Allergies No current facility-administered medications on file prior to encounter.   Current Outpatient Medications on File Prior to Encounter  Medication Sig Dispense Refill  . Ascorbic Acid (VITAMIN C PO) Take by mouth daily.    . Cetirizine HCl 10 MG CAPS Take 1 capsule (10 mg total) by mouth daily for 10 days. 10 capsule 0  . Prenatal Vit-Fe Fumarate-FA (PRENATAL VITAMIN PO) Take by mouth daily.     Social History   Socioeconomic History  . Marital status: Married    Spouse name: Shea Stakes  . Number of children: 3  . Years of education: Not on file  . Highest education level: High school graduate  Occupational History  . Not on file  Tobacco Use  . Smoking status: Never Smoker  . Smokeless tobacco: Never Used  Substance and Sexual Activity  . Alcohol use: No    Alcohol/week: 0.0 standard drinks  . Drug use: No  . Sexual activity: Not Currently   Birth control/protection: None  Other Topics Concern  . Not on file  Social History Narrative  . Not on file   Social Determinants of Health   Financial Resource Strain:   . Difficulty of Paying Living Expenses: Not on file  Food Insecurity:   . Worried About Charity fundraiser in the Last Year: Not on file  . Ran Out of Food in the Last Year: Not on file  Transportation Needs:   . Lack of Transportation (Medical): Not on file  . Lack of Transportation (Non-Medical): Not on file  Physical Activity:   . Days of Exercise per Week: Not on file  . Minutes of Exercise per Session: Not on file  Stress:   . Feeling of Stress : Not on file  Social Connections:   . Frequency of Communication with Friends and Family: Not on file  . Frequency of Social Gatherings with Friends and Family: Not on file  . Attends Religious Services: Not on file  . Active Member of Clubs or Organizations: Not on file  . Attends Archivist Meetings: Not on file  . Marital Status: Not on file  Intimate Partner Violence:   . Fear of Current or Ex-Partner: Not on file  . Emotionally Abused: Not on file  . Physically Abused: Not on file  . Sexually Abused: Not on file  Family History  Problem Relation Age of Onset  . Hypothyroidism Mother   . Diabetes Maternal Grandmother   . Cancer Maternal Grandfather        esophagus    OBJECTIVE:  Vitals:   01/20/19 1635  BP: 122/86  Pulse: (!) 117  Resp: 17  Temp: (!) 100.4 F (38 C)  TempSrc: Oral  SpO2: 98%     General appearance: alert; appears fatigued, but nontoxic; speaking in full sentences and tolerating own secretions HEENT: NCAT; Ears: EACs clear, TMs pearly gray; Eyes: PERRL.  EOM grossly intact. Nose: nares patent without rhinorrhea, Throat: oropharynx clear, tonsils non erythematous or enlarged, uvula midline  Neck: supple without LAD Lungs: unlabored respirations, symmetrical air entry; cough: absent; no respiratory distress;  CTAB Heart: Tachycardic Skin: warm and dry Psychological: alert and cooperative; normal mood and affect  ASSESSMENT & PLAN:  1. Suspected COVID-19 virus infection     Meds ordered this encounter  Medications  . fluticasone (FLONASE) 50 MCG/ACT nasal spray    Sig: Place 2 sprays into both nostrils daily.    Dispense:  16 g    Refill:  0    Order Specific Question:   Supervising Provider    Answer:   Eustace Moore [[id number]]  . ibuprofen (ADVIL) 800 MG tablet    Sig: Take 1 tablet (800 mg total) by mouth 3 (three) times daily.    Dispense:  21 tablet    Refill:  0    Order Specific Question:   Supervising Provider    Answer:   Eustace Moore [[id number]]   COVID testing ordered.  It will take between 5-7 days for test results.  Someone will contact you regarding abnormal results.    In the meantime: You should remain isolated in your home for 10 days from symptom onset AND greater than 72 hours after symptoms resolution (absence of fever without the use of fever-reducing medication and improvement in respiratory symptoms), whichever is longer Get plenty of rest and push fluids Use OTC zyrtec for nasal congestion, runny nose, and/or sore throat Use OTC flonase for nasal congestion and runny nose Use medications daily for symptom relief Use OTC medications like ibuprofen or tylenol as needed fever or pain Call or go to the ED if you have any new or worsening symptoms such as fever, cough, shortness of breath, chest tightness, chest pain, turning blue, changes in mental status, etc...   Reviewed expectations re: course of current medical issues. Questions answered. Outlined signs and symptoms indicating need for more acute intervention. Patient verbalized understanding. After Visit Summary given.         Rennis Harding, PA-C 01/20/19 1712

## 2019-01-20 NOTE — ED Triage Notes (Signed)
Pt has had fever and needs covid testing

## 2019-01-22 LAB — NOVEL CORONAVIRUS, NAA: SARS-CoV-2, NAA: NOT DETECTED

## 2019-01-23 ENCOUNTER — Emergency Department (HOSPITAL_COMMUNITY)
Admission: EM | Admit: 2019-01-23 | Discharge: 2019-01-23 | Disposition: A | Discharge status: Home / Self Care | Payer: Self-pay | Attending: Emergency Medicine | Admitting: Emergency Medicine

## 2019-01-23 ENCOUNTER — Emergency Department (HOSPITAL_COMMUNITY): Payer: Self-pay

## 2019-01-23 ENCOUNTER — Other Ambulatory Visit: Payer: Self-pay

## 2019-01-23 ENCOUNTER — Encounter (HOSPITAL_COMMUNITY): Payer: Self-pay

## 2019-01-23 DIAGNOSIS — R079 Chest pain, unspecified: Secondary | ICD-10-CM | POA: Insufficient documentation

## 2019-01-23 DIAGNOSIS — E119 Type 2 diabetes mellitus without complications: Secondary | ICD-10-CM | POA: Insufficient documentation

## 2019-01-23 LAB — TROPONIN I (HIGH SENSITIVITY)
Troponin I (High Sensitivity): <2 ng/L (ref <18)
Troponin I (High Sensitivity): <2 ng/L (ref <18)

## 2019-01-23 LAB — BASIC METABOLIC PANEL
Anion gap: 9 (ref 5–15)
BUN: 6 mg/dL (ref 6–20)
CO2: 25 mmol/L (ref 22–32)
Calcium: 8.7 mg/dL — ABNORMAL LOW (ref 8.9–10.3)
Chloride: 106 mmol/L (ref 98–111)
Creatinine, Ser: 0.48 mg/dL (ref 0.44–1.00)
GFR calc Af Amer: >60 mL/min (ref >60)
GFR calc non Af Amer: >60 mL/min (ref >60)
Glucose, Bld: 110 mg/dL — ABNORMAL HIGH (ref 70–99)
Potassium: 4.1 mmol/L (ref 3.5–5.1)
Sodium: 140 mmol/L (ref 135–145)

## 2019-01-23 LAB — CBC
HCT: 40.4 % (ref 36.0–46.0)
Hemoglobin: 12.9 g/dL (ref 12.0–15.0)
MCH: 28.7 pg (ref 26.0–34.0)
MCHC: 31.9 g/dL (ref 30.0–36.0)
MCV: 90 fL (ref 80.0–100.0)
Platelets: 187 10*3/uL (ref 150–400)
RBC: 4.49 MIL/uL (ref 3.87–5.11)
RDW: 12.8 % (ref 11.5–15.5)
WBC: 7.1 10*3/uL (ref 4.0–10.5)
nRBC: 0 % (ref 0.0–0.2)

## 2019-01-23 LAB — I-STAT BETA HCG BLOOD, ED (MC, WL, AP ONLY): I-stat hCG, quantitative: <5 m[IU]/mL (ref <5)

## 2019-01-23 MED ORDER — SODIUM CHLORIDE 0.9% FLUSH: 3.0000 mL | Freq: Once | INTRAVENOUS | Status: DC

## 2019-01-23 MED ORDER — LIDOCAINE 5 % EX PTCH: 1.0000 | MEDICATED_PATCH | CUTANEOUS | 0 refills | Status: DC

## 2019-01-23 NOTE — ED Provider Notes (Signed)
[facility] EMERGENCY DEPARTMENT Provider Note   CSN: [id number] Arrival date & time: 01/23/19  3500     History Chief Complaint  Patient presents with  . Chest Pain    Jamie Lutz is a 24 y.o. female.  HPI      Jamie Lutz is a 24 y.o. female, with a history of anxiety and DM, presenting to the ED with pain in the chest for the last 2 days.  Pain is located in the left upper chest, she describes the pain as a dull aching that is sometimes sharp, fairly constant, worse with lying on that side, sometimes radiating to the left arm, especially with movement of the left arm. She has not had this pain before.  She has not tried any therapies for this pain. She notes she had about a week of fever, cough, and congestion for about a week preceding the onset of her present pain.  She still feels as though she has mucus in her chest that she is unable to cough up. She is currently breast-feeding a 31-month-old infant. Denies history of PE/DVT, heart conditions, exertional syncope, family history of SCD. Denies current fever/chills, diaphoresis, dizziness, syncope, shortness of breath, abdominal pain, breast pain/color change/abnormal tenderness, extremity numbness/weakness, trauma, extremity swelling/color change, exertional symptoms, or any other complaints.    Past Medical History:  Diagnosis Date  . Anxiety   . Diabetes mellitus without complication (HCC)   . Gestational diabetes   . No pertinent past medical history     Patient Active Problem List   Diagnosis Date Noted  . Depression with anxiety 06/05/2016  . History of gestational diabetes 02/25/2016  . Adjustment disorder with anxious mood 11/03/2014  . 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 Chronic abdominal pain 05/03/2014  . Constipation 05/03/2014  . Paresthesia 02/06/2014    Past Surgical History:  Procedure Laterality Date  . NO PAST SURGERIES    . PERINEAL LACERATION REPAIR   04/17/2011   Procedure: SUTURE REPAIR PERINEAL LACERATION;  Surgeon: Tilda Burrow, MD;  Location: [facility];  Service: Gynecology;  Laterality: N/A;     OB History    Gravida  4   Para  4   Term  4   Preterm  0   AB  0   Living  4     SAB  0   TAB  0   Ectopic  0   Multiple  0   Live Births  4           Family History  Problem Relation Age of Onset  . Hypothyroidism Mother   . Diabetes Maternal Grandmother   . Cancer Maternal Grandfather        esophagus    Social History   Tobacco Use  . Smoking status: Never Smoker  . Smokeless tobacco: Never Used  Substance Use Topics  . Alcohol use: No    Alcohol/week: 0.0 standard drinks  . Drug use: No    Home Medications Prior to Admission medications   Medication Sig Start Date End Date Taking? Authorizing Provider  Ascorbic Acid (VITAMIN C PO) Take by mouth daily.    [provider]  Cetirizine HCl 10 MG CAPS Take 1 capsule (10 mg total) by mouth daily for 10 days. 05/21/18 05/31/18  Wieters, Hallie C, PA-C  fluticasone (FLONASE) 50 MCG/ACT nasal spray Place 2 sprays into both nostrils daily. 01/20/19   Wurst, [place], PA-C  ibuprofen (ADVIL) 800 MG tablet  Take 1 tablet (800 mg total) by mouth 3 (three) times daily. 01/20/19   Wurst, [place], PA-C  lidocaine (LIDODERM) 5 % Place 1 patch onto the skin daily. Remove & Discard patch within 12 hours or as directed by MD 01/23/19   Lakyla Biswas, Hillard Danker, PA-C  Prenatal Vit-Fe Fumarate-FA (PRENATAL VITAMIN PO) Take by mouth daily.    [provider]    Allergies    Patient has no known allergies.  Review of Systems   Review of Systems  Constitutional: Negative for chills, diaphoresis and fever.  Respiratory: Negative for cough and shortness of breath.   Cardiovascular: Positive for chest pain. Negative for leg swelling.  Gastrointestinal: Negative for abdominal pain, diarrhea, nausea and vomiting.  Musculoskeletal: Negative for back pain and myalgias.    Neurological: Negative for dizziness, syncope, weakness and numbness.  All other systems reviewed and are negative.   Physical Exam Updated Vital Signs BP 101/66   Pulse 86   Temp 98.3 F (36.8 C) (Oral)   Resp 16   SpO2 99%   Physical Exam Vitals and nursing note reviewed. Exam conducted with a chaperone present.  Constitutional:      General: She is not in acute distress.    Appearance: She is well-developed. She is not diaphoretic.  HENT:     Head: Normocephalic and atraumatic.     Mouth/Throat:     Mouth: Mucous membranes are moist.     Pharynx: Oropharynx is clear.  Eyes:     Conjunctiva/sclera: Conjunctivae normal.  Cardiovascular:     Rate and Rhythm: Normal rate and regular rhythm.     Pulses: Normal pulses.          Radial pulses are 2+ on the right side and 2+ on the left side.       Posterior tibial pulses are 2+ on the right side and 2+ on the left side.     Heart sounds: Normal heart sounds.     Comments: Tactile temperature in the extremities appropriate and equal bilaterally. Pulmonary:     Effort: Pulmonary effort is normal. No respiratory distress.     Breath sounds: Normal breath sounds.  Chest:     Chest wall: Tenderness present. No mass, deformity, swelling or crepitus.     Breasts:        Left: No skin change or tenderness.       Comments: Tenderness in the left upper chest as shown.  No tenderness in the rest of the chest. No color change, swelling, wounds, deformity, increased warmth, or instability. Abdominal:     Palpations: Abdomen is soft.     Tenderness: There is no abdominal tenderness. There is no guarding.  Musculoskeletal:     Cervical back: Neck supple.     Right lower leg: No edema.     Left lower leg: No edema.     Comments: Some tenderness to the proximal bicep region without swelling, color change, increased warmth, or deformity. Full range of motion without noted difficulty in the left shoulder, which patient states is  painful. No noted tenderness over the deep venous system of the left upper arm.  Lymphadenopathy:     Cervical: No cervical adenopathy.     Upper Body:     Left upper body: No supraclavicular, axillary, pectoral or epitrochlear adenopathy.  Skin:    General: Skin is warm and dry.  Neurological:     Mental Status: She is alert.     Comments: Sensation grossly  intact to light touch through each of the nerve distributions of the bilateral upper extremities. Abduction and adduction of the fingers intact against resistance. Grip strength equal bilaterally. Supination and pronation intact against resistance. Strength 5/5 through the cardinal directions of the bilateral wrists. Strength 5/5 with flexion and extension of the bilateral elbows. Patient can touch the thumb to each one of the fingertips without difficulty.  Patient can hold the "OK" sign against resistance.  Psychiatric:        Mood and Affect: Mood and affect normal.        Speech: Speech normal.        Behavior: Behavior normal.     ED Results / Procedures / Treatments   Labs (all labs ordered are listed, but only abnormal results are displayed) Labs Reviewed  BASIC METABOLIC PANEL - Abnormal; Notable for the following components:      Result Value   Glucose, Bld 110 (*)    Calcium 8.7 (*)    All other components within normal limits  CBC  I-STAT BETA HCG BLOOD, ED ([facility], [facility], [facility] ONLY)  TROPONIN I (HIGH SENSITIVITY)  TROPONIN I (HIGH SENSITIVITY)    EKG EKG Interpretation  Date/Time:  Sunday January 23 2019 03:18:00 EST Ventricular Rate:  104 PR Interval:  144 QRS Duration: 72 QT Interval:  342 QTC Calculation: 449 R Axis:   -165 Text Interpretation: Sinus tachycardia Right superior axis deviation Possible Right ventricular hypertrophy Abnormal ECG Confirmed by Raeford Razor [phone]) on 01/23/2019 8:23:22 AM   Radiology DG Chest 2 View  Result Date: 01/23/2019 CLINICAL DATA:  Initial evaluation for acute chest  pain. EXAM: CHEST - 2 VIEW COMPARISON:  None. FINDINGS: The cardiac and mediastinal silhouettes are within normal limits. The lungs are normally inflated. Increased density at the medial right lung base on frontal view felt to be related to soft tissue attenuation from overlying breast tissue. No focal infiltrates. No edema or effusion. No pneumothorax. No acute osseous abnormality. IMPRESSION: No active cardiopulmonary disease. Electronically Signed   By: Rise Mu M.D.   On: 01/23/2019 03:51    Procedures Procedures (including critical care time)  Medications Ordered in ED Medications  sodium chloride flush (NS) 0.9 % injection 3 mL (has no administration in time range)    ED Course  I have reviewed the triage vital signs and the nursing notes.  Pertinent labs & imaging results that were available during my care of the patient were reviewed by me and considered in my medical decision making (see chart for details).    MDM Rules/Calculators/A&P                      Patient presents with complaint of chest pain with tenderness to the chest. Lab work reassuring.  Delta troponins negative.  Chest x-ray without acute abnormality.  No neurologic deficits in the extremities.  No noted skin changes on exam.  No lymphadenopathy noted. PERC negative.  Based on patient's vital signs, clear chest x-ray, and reassuring lab work, my suspicion for an acute cardiac cause for the patient's pain is low, however, we will still give the patient the option for cardiology follow-up as well as PCP follow-up. The patient was given instructions for home care as well as return precautions. Patient voices understanding of these instructions, accepts the plan, and is comfortable with discharge.   Vitals:   01/23/19 0322 01/23/19 0711  BP: 124/81 101/66  Pulse: 98 86  Resp: 16 16  Temp: 98.2 F (36.8 C) 98.3 F (36.8 C)  TempSrc: Oral Oral  SpO2: 100% 99%     Final Clinical Impression(s) / ED  Diagnoses Final diagnoses:  Nonspecific chest pain    Rx / DC Orders ED Discharge Orders         Ordered    lidocaine (LIDODERM) 5 %  Every 24 hours     01/23/19 0846           Lorayne Bender, PA-C 01/23/19 0901    Virgel Manifold, MD 01/23/19 1228

## 2019-01-23 NOTE — ED Notes (Signed)
ED Provider at bedside. 

## 2019-01-23 NOTE — Discharge Instructions (Addendum)
  Acetaminophen: May take acetaminophen (generic for Tylenol), as needed, for pain. Your daily total maximum amount of acetaminophen from all sources should be limited to 4000mg /day for persons without liver problems, or 2000mg /day for those with liver problems.  Lidocaine patches: These are available via either prescription or over-the-counter. The over-the-counter option may be more economical one and are likely just as effective. There are multiple over-the-counter brands, such as Salonpas.  Hydration: Symptoms of most illnesses will be intensified and complicated by dehydration. Dehydration can also extend the duration of symptoms. Drink plenty of fluids and get plenty of rest. You should be drinking at least half a liter of water an hour to stay hydrated. Electrolyte drinks (ex. Gatorade, Powerade, Pedialyte) are also encouraged. You should be drinking enough fluids to make your urine light yellow, almost clear. If this is not the case, you are not drinking enough water.  Ask your OB/GYN or your child's pediatrician about you taking Mucinex. You should also talk to your OB/GYN and/or a primary care provider regarding your pain.  Should this pain persist, please follow-up with cardiology.  Call the number provided to set up an appointment.

## 2019-01-23 NOTE — ED Triage Notes (Signed)
Pt comes for CP that has been going on all day, central with nausea.

## 2019-01-31 ENCOUNTER — Encounter: Payer: Self-pay | Admitting: Cardiology

## 2019-01-31 NOTE — Progress Notes (Deleted)
Cardiology Office Note  Date: 01/31/2019   ID: Jamie Lutz, DOB 23-Aug-1995, MRN [id number]  PCP:  Patient, No Pcp Per  Consulting Cardiologist: Satira Sark, MD Electrophysiologist:  None   No chief complaint on file.   History of Present Illness: Jamie Lutz is a 24 y.o. female referred for cardiology consultation by Dr. Wilson Singer for evaluation of atypical chest discomfort.  I reviewed recent records, she was seen in the ER on January 3 with notes indicating atypical chest pain and chest tenderness, normal high-sensitivity troponin I levels, and chest x-ray without acute findings.  Her ECG showed sinus tachycardia with rightward axis.  Recent SARS coronavirus 2 test negative.  She follows at the [facility], was seen earlier this year spontaneous vaginal delivery in February.  She has a history of gestational diabetes.  Past Medical History:  Diagnosis Date  . Anxiety   . Depression   . Gestational diabetes     Past Surgical History:  Procedure Laterality Date  . PERINEAL LACERATION REPAIR  04/17/2011   Procedure: SUTURE REPAIR PERINEAL LACERATION;  Surgeon: Jonnie Kind, MD;  Location: [facility];  Service: Gynecology;  Laterality: N/A;    Current Outpatient Medications  Medication Sig Dispense Refill  . Ascorbic Acid (VITAMIN C PO) Take by mouth daily.    . Cetirizine HCl 10 MG CAPS Take 1 capsule (10 mg total) by mouth daily for 10 days. 10 capsule 0  . fluticasone (FLONASE) 50 MCG/ACT nasal spray Place 2 sprays into both nostrils daily. 16 g 0  . ibuprofen (ADVIL) 800 MG tablet Take 1 tablet (800 mg total) by mouth 3 (three) times daily. 21 tablet 0  . lidocaine (LIDODERM) 5 % Place 1 patch onto the skin daily. Remove & Discard patch within 12 hours or as directed by MD 30 patch 0  . Prenatal Vit-Fe Fumarate-FA (PRENATAL VITAMIN PO) Take by mouth daily.     No current facility-administered medications for this visit.   Allergies:   Patient has no known allergies.   Social History: The patient  reports that she has never smoked. She has never used smokeless tobacco. She reports that she does not drink alcohol or use drugs.   Family History: The patient's family history includes Cancer in her maternal grandfather; Diabetes in her maternal grandmother; Hypothyroidism in her mother.   ROS:  Please see the history of present illness. Otherwise, complete review of systems is positive for {NONE DEFAULTED:18576::"none"}.  All other systems are reviewed and negative.   Physical Exam: VS:  There were no vitals taken for this visit., BMI There is no height or weight on file to calculate BMI.  Wt Readings from Last 3 Encounters:  03/13/18 190 lb (86.2 kg)  03/11/18 188 lb (85.3 kg)  02/25/18 185 lb (83.9 kg)    General: Patient appears comfortable at rest. HEENT: Conjunctiva and lids normal, oropharynx clear with moist mucosa. Neck: Supple, no elevated JVP or carotid bruits, no thyromegaly. Lungs: Clear to auscultation, nonlabored breathing at rest. Cardiac: Regular rate and rhythm, no S3 or significant systolic murmur, no pericardial rub. Abdomen: Soft, nontender, no hepatomegaly, bowel sounds present, no guarding or rebound. Extremities: No pitting edema, distal pulses 2+. Skin: Warm and dry. Musculoskeletal: No kyphosis. Neuropsychiatric: Alert and oriented x3, affect grossly appropriate.  ECG:  An ECG dated 01/23/2019 was personally reviewed today and demonstrated:  Sinus tachycardia with rightward axis.  Recent Labwork: 02/10/2018: ALT 11; AST 14 05/21/2018: TSH  0.526 01/23/2019: BUN 6; Creatinine, Ser 0.48; Hemoglobin 12.9; Platelets 187; Potassium 4.1; Sodium 140   Other Studies Reviewed Today:  Chest x-ray 01/23/2019: FINDINGS: The cardiac and mediastinal silhouettes are within normal limits.  The lungs are normally inflated. Increased density at the medial right lung base on frontal view felt to be related to soft  tissue attenuation from overlying breast tissue. No focal infiltrates. No edema or effusion. No pneumothorax.  No acute osseous abnormality.  IMPRESSION: No active cardiopulmonary disease.  Assessment and Plan:   Medication Adjustments/Labs and Tests Ordered: Current medicines are reviewed at length with the patient today.  Concerns regarding medicines are outlined above.   Tests Ordered: No orders of the defined types were placed in this encounter.   Medication Changes: No orders of the defined types were placed in this encounter.   Disposition:  Follow up {follow up:15908}  Signed, Jonelle Sidle, MD, [facility] 01/31/2019 4:05 PM    [facility] at [facility] [address], [place], [place] [address] Phone: [phone]; Fax: [phone]

## 2019-02-01 ENCOUNTER — Emergency Department (HOSPITAL_COMMUNITY)
Admission: EM | Admit: 2019-02-01 | Discharge: 2019-02-01 | Discharge status: Against Medical Advice | Payer: Self-pay | Attending: Emergency Medicine | Admitting: Emergency Medicine

## 2019-02-01 ENCOUNTER — Encounter (HOSPITAL_COMMUNITY): Payer: Self-pay | Admitting: Emergency Medicine

## 2019-02-01 ENCOUNTER — Ambulatory Visit: Payer: Self-pay | Admitting: Cardiology

## 2019-02-01 DIAGNOSIS — Z5321 Procedure and treatment not carried out due to patient leaving prior to being seen by health care provider: Secondary | ICD-10-CM | POA: Insufficient documentation

## 2019-02-01 LAB — BASIC METABOLIC PANEL
Anion gap: 5 (ref 5–15)
BUN: 7 mg/dL (ref 6–20)
CO2: 26 mmol/L (ref 22–32)
Calcium: 9 mg/dL (ref 8.9–10.3)
Chloride: 108 mmol/L (ref 98–111)
Creatinine, Ser: 0.58 mg/dL (ref 0.44–1.00)
GFR calc Af Amer: >60 mL/min (ref >60)
GFR calc non Af Amer: >60 mL/min (ref >60)
Glucose, Bld: 96 mg/dL (ref 70–99)
Potassium: 4.1 mmol/L (ref 3.5–5.1)
Sodium: 139 mmol/L (ref 135–145)

## 2019-02-01 LAB — TROPONIN I (HIGH SENSITIVITY)
Troponin I (High Sensitivity): <2 ng/L (ref <18)
Troponin I (High Sensitivity): <2 ng/L (ref <18)

## 2019-02-01 LAB — CBC
HCT: 41 % (ref 36.0–46.0)
Hemoglobin: 13.1 g/dL (ref 12.0–15.0)
MCH: 28.7 pg (ref 26.0–34.0)
MCHC: 32 g/dL (ref 30.0–36.0)
MCV: 89.9 fL (ref 80.0–100.0)
Platelets: 277 10*3/uL (ref 150–400)
RBC: 4.56 MIL/uL (ref 3.87–5.11)
RDW: 13.2 % (ref 11.5–15.5)
WBC: 5.8 10*3/uL (ref 4.0–10.5)
nRBC: 0 % (ref 0.0–0.2)

## 2019-02-01 LAB — I-STAT BETA HCG BLOOD, ED (MC, WL, AP ONLY): I-stat hCG, quantitative: <5 m[IU]/mL (ref <5)

## 2019-02-01 MED ORDER — SODIUM CHLORIDE 0.9% FLUSH: 3.0000 mL | Freq: Once | INTRAVENOUS | Status: DC

## 2019-02-01 NOTE — ED Triage Notes (Signed)
Pt states she was seen here on 1/3 for chest pain and left sided chest pain has not resolved she also feels "shaking and weak all over".

## 2019-02-01 NOTE — ED Notes (Addendum)
Pt stated that she was leaving  

## 2019-02-05 ENCOUNTER — Encounter (HOSPITAL_COMMUNITY): Payer: Self-pay | Admitting: Emergency Medicine

## 2019-02-05 ENCOUNTER — Emergency Department (HOSPITAL_COMMUNITY)
Admission: EM | Admit: 2019-02-05 | Discharge: 2019-02-05 | Disposition: A | Discharge status: Home / Self Care | Payer: Self-pay | Attending: Emergency Medicine | Admitting: Emergency Medicine

## 2019-02-05 ENCOUNTER — Other Ambulatory Visit: Payer: Self-pay

## 2019-02-05 ENCOUNTER — Emergency Department (HOSPITAL_COMMUNITY): Payer: Self-pay

## 2019-02-05 DIAGNOSIS — Z79899 Other long term (current) drug therapy: Secondary | ICD-10-CM | POA: Insufficient documentation

## 2019-02-05 DIAGNOSIS — R079 Chest pain, unspecified: Secondary | ICD-10-CM

## 2019-02-05 DIAGNOSIS — U071 COVID-19: Secondary | ICD-10-CM | POA: Insufficient documentation

## 2019-02-05 NOTE — ED Triage Notes (Addendum)
C/o pain to center of chest x 2 weeks with sob, L ear ringing, and congestion.  States she felt a "ball of something" in nose that went down throat and pressure to head.  Reports testing negative for COVID 2 weeks ago.

## 2019-02-05 NOTE — ED Notes (Signed)
Pt verbalized understanding of discharge paperwork and follow-up care. Pt tearful at discharge, stating she still has pressure in back of her head.

## 2019-02-05 NOTE — ED Provider Notes (Signed)
[facility] EMERGENCY DEPARTMENT Provider Note   CSN: [id number] Arrival date & time: 02/05/19  1546     History Chief Complaint  Patient presents with  . Chest Pain  . Shortness of Breath    Jamie Lutz is a 24 y.o. female.  HPI   This patient is a very pleasant 24 year old female, she has a history of anxiety depression and a recent history of a Covid-like illness.  She reports that multiple family members had Covid, shortly after they were symptomatic she became symptomatic with upper respiratory symptoms, she had headache, she had some shortness of breath, she had a sore throat, most of which has improved however she is left with a feeling of discomfort in the middle of her chest.  It is not positional, not associated with exertion, there is no radiation of the pain, at one point she was having some neck discomfort and a feeling of mucus in the back of her throat etc.  She is very tearful during the interview worried that she is going to get very sick because her father is in the intensive care unit with Covid.  Her mother is at home with coughing, she has 4 young children and is worried about them.  This patient has no prior cardiac history  Past Medical History:  Diagnosis Date  . Anxiety   . Depression   . Gestational diabetes     Patient Active Problem List   Diagnosis Date Noted  . Depression with anxiety 06/05/2016  . History of gestational diabetes 02/25/2016  . Adjustment disorder with anxious mood 11/03/2014  . 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 Chronic abdominal pain 05/03/2014  . Constipation 05/03/2014  . Paresthesia 02/06/2014    Past Surgical History:  Procedure Laterality Date  . PERINEAL LACERATION REPAIR  04/17/2011   Procedure: SUTURE REPAIR PERINEAL LACERATION;  Surgeon: Jonnie Kind, MD;  Location: [facility];  Service: Gynecology;  Laterality: N/A;     OB History    Gravida  4   Para  4   Term  4   Preterm  0   AB  0   Living  4     SAB  0   TAB  0   Ectopic  0   Multiple  0   Live Births  4           Family History  Problem Relation Age of Onset  . Hypothyroidism Mother   . Diabetes Maternal Grandmother   . Cancer Maternal Grandfather        esophagus    Social History   Tobacco Use  . Smoking status: Never Smoker  . Smokeless tobacco: Never Used  Substance Use Topics  . Alcohol use: No    Alcohol/week: 0.0 standard drinks  . Drug use: No    Home Medications Prior to Admission medications   Medication Sig Start Date End Date Taking? Authorizing Provider  Ascorbic Acid (VITAMIN C PO) Take by mouth daily.    [provider]  Cetirizine HCl 10 MG CAPS Take 1 capsule (10 mg total) by mouth daily for 10 days. 05/21/18 05/31/18  Wieters, Hallie C, PA-C  fluticasone (FLONASE) 50 MCG/ACT nasal spray Place 2 sprays into both nostrils daily. 01/20/19   Wurst, [place], PA-C  ibuprofen (ADVIL) 800 MG tablet Take 1 tablet (800 mg total) by mouth 3 (three) times daily. 01/20/19   Wurst, [place], PA-C  lidocaine (LIDODERM) 5 % Place 1 patch onto  the skin daily. Remove & Discard patch within 12 hours or as directed by MD 01/23/19   Joy, Hillard Danker, PA-C  Prenatal Vit-Fe Fumarate-FA (PRENATAL VITAMIN PO) Take by mouth daily.    [provider]    Allergies    Patient has no known allergies.  Review of Systems   Review of Systems  All other systems reviewed and are negative.   Physical Exam Updated Vital Signs BP 109/83 (BP Location: Left Arm)   Pulse 99   Temp 98.3 F (36.8 C) (Oral)   Resp 14   LMP 02/02/2019   SpO2 97%   Physical Exam Vitals and nursing note reviewed.  Constitutional:      General: She is not in acute distress.    Appearance: She is well-developed.  HENT:     Head: Normocephalic and atraumatic.     Mouth/Throat:     Pharynx: No oropharyngeal exudate.  Eyes:     General: No scleral icterus.       Right eye: No  discharge.        Left eye: No discharge.     Conjunctiva/sclera: Conjunctivae normal.     Pupils: Pupils are equal, round, and reactive to light.  Neck:     Thyroid: No thyromegaly.     Vascular: No JVD.  Cardiovascular:     Rate and Rhythm: Normal rate and regular rhythm.     Heart sounds: Normal heart sounds. No murmur. No friction rub. No gallop.   Pulmonary:     Effort: Pulmonary effort is normal. No respiratory distress.     Breath sounds: Normal breath sounds. No wheezing or rales.  Chest:     Comments: Chaperone present for exam, the patient has reproducible tenderness in the left and mid chest.  There is no signs of bruising Abdominal:     General: Bowel sounds are normal. There is no distension.     Palpations: Abdomen is soft. There is no mass.     Tenderness: There is no abdominal tenderness.  Musculoskeletal:        General: No tenderness. Normal range of motion.     Cervical back: Normal range of motion and neck supple.  Lymphadenopathy:     Cervical: No cervical adenopathy.  Skin:    General: Skin is warm and dry.     Findings: No erythema or rash.  Neurological:     Mental Status: She is alert.     Coordination: Coordination normal.     Comments: Well-appearing moving all 4 extremities, normal speech, normal cranial nerves III through XII  Psychiatric:        Behavior: Behavior normal.     ED Results / Procedures / Treatments   Labs (all labs ordered are listed, but only abnormal results are displayed) Labs Reviewed - No data to display  EKG None   EKG performed on February 05, 2019 at 3:55 PM shows normal sinus rhythm with a rate of 98, normal axis intervals ST segments and T waves.  No signs of LVH, this is a normal EKG.  Radiology DG Chest Port 1 View  Result Date: 02/05/2019 CLINICAL DATA:  Chest pain EXAM: PORTABLE CHEST 1 VIEW COMPARISON:  01/23/2019 FINDINGS: The heart size and mediastinal contours are within normal limits. Both lungs are clear.  The visualized skeletal structures are unremarkable. IMPRESSION: No acute cardiopulmonary findings. Electronically Signed   By: Duanne Guess D.O.   On: 02/05/2019 16:46    Procedures Procedures (including critical  care time)  Medications Ordered in ED Medications - No data to display  ED Course  I have reviewed the triage vital signs and the nursing notes.  Pertinent labs & imaging results that were available during my care of the patient were reviewed by me and considered in my medical decision making (see chart for details).  Clinical Course as of Feb 04 1649  Sat Feb 05, 2019  1649 I have personally viewed the CXR - normal appearing - I agree with the radiologist - stable for d/c.   [BM]    Clinical Course User Index [BM] Eber Hong, MD   MDM Rules/Calculators/A&P                       The patient's EKG is totally normal, her lungs are very clear, she appears well and has no had return of her smell which she had initially lost.  Her symptoms are consistent with prior Covid.  At this time the patient will get a chest x-ray to rule out any post Covid complications but she is well-appearing and I anticipate discharge.  She is agreeable and I suspect much of what is driving her visit today is anxiety and concerned about how her family members have done  Stable for d/c.  Final Clinical Impression(s) / ED Diagnoses Final diagnoses:  COVID-19  Chest pain, unspecified type    Rx / DC Orders ED Discharge Orders    None       Eber Hong, MD 02/05/19 1651

## 2019-02-05 NOTE — Discharge Instructions (Signed)
Your testing is normal  May take Tylenol or ibuprofen for pain in your chest, rest assured that your EKG showed that your heart looks great and your chest x-ray shows that your heart and your lungs look great.  You may follow-up with your doctor as needed.  This may take another 1 or 2 weeks to continue to improve but may linger for quite some time.  Return for severe or worsening symptoms

## 2019-02-13 ENCOUNTER — Emergency Department (HOSPITAL_COMMUNITY): Payer: Self-pay

## 2019-02-13 ENCOUNTER — Encounter (HOSPITAL_COMMUNITY): Payer: Self-pay | Admitting: *Deleted

## 2019-02-13 ENCOUNTER — Other Ambulatory Visit: Payer: Self-pay

## 2019-02-13 ENCOUNTER — Ambulatory Visit
Admission: EM | Admit: 2019-02-13 | Discharge: 2019-02-13 | Disposition: A | Discharge status: Home / Self Care | Payer: MEDICAID

## 2019-02-13 ENCOUNTER — Emergency Department (HOSPITAL_COMMUNITY)
Admission: EM | Admit: 2019-02-13 | Discharge: 2019-02-13 | Disposition: A | Discharge status: Home / Self Care | Payer: Self-pay | Attending: Emergency Medicine | Admitting: Emergency Medicine

## 2019-02-13 DIAGNOSIS — R072 Precordial pain: Secondary | ICD-10-CM

## 2019-02-13 DIAGNOSIS — R0789 Other chest pain: Secondary | ICD-10-CM

## 2019-02-13 DIAGNOSIS — R600 Localized edema: Secondary | ICD-10-CM

## 2019-02-13 DIAGNOSIS — M7918 Myalgia, other site: Secondary | ICD-10-CM | POA: Insufficient documentation

## 2019-02-13 DIAGNOSIS — R0602 Shortness of breath: Secondary | ICD-10-CM

## 2019-02-13 DIAGNOSIS — M79662 Pain in left lower leg: Secondary | ICD-10-CM

## 2019-02-13 DIAGNOSIS — Z79899 Other long term (current) drug therapy: Secondary | ICD-10-CM | POA: Insufficient documentation

## 2019-02-13 LAB — CBC WITH DIFFERENTIAL/PLATELET
Abs Immature Granulocytes: 0.03 10*3/uL (ref 0.00–0.07)
Basophils Absolute: 0 10*3/uL (ref 0.0–0.1)
Basophils Relative: 0 %
Eosinophils Absolute: 0.1 10*3/uL (ref 0.0–0.5)
Eosinophils Relative: 1 %
HCT: 43.2 % (ref 36.0–46.0)
Hemoglobin: 13.7 g/dL (ref 12.0–15.0)
Immature Granulocytes: 0 %
Lymphocytes Relative: 30 %
Lymphs Abs: 2.2 10*3/uL (ref 0.7–4.0)
MCH: 29.1 pg (ref 26.0–34.0)
MCHC: 31.7 g/dL (ref 30.0–36.0)
MCV: 91.7 fL (ref 80.0–100.0)
Monocytes Absolute: 0.5 10*3/uL (ref 0.1–1.0)
Monocytes Relative: 6 %
Neutro Abs: 4.6 10*3/uL (ref 1.7–7.7)
Neutrophils Relative %: 63 %
Platelets: 172 10*3/uL (ref 150–400)
RBC: 4.71 MIL/uL (ref 3.87–5.11)
RDW: 13.6 % (ref 11.5–15.5)
WBC: 7.4 10*3/uL (ref 4.0–10.5)
nRBC: 0 % (ref 0.0–0.2)

## 2019-02-13 LAB — URINALYSIS, ROUTINE W REFLEX MICROSCOPIC
Bilirubin Urine: NEGATIVE
Glucose, UA: NEGATIVE mg/dL
Hgb urine dipstick: NEGATIVE
Ketones, ur: NEGATIVE mg/dL
Nitrite: NEGATIVE
Protein, ur: NEGATIVE mg/dL
Specific Gravity, Urine: 1.015 (ref 1.005–1.030)
pH: 6 (ref 5.0–8.0)

## 2019-02-13 LAB — BASIC METABOLIC PANEL
Anion gap: 9 (ref 5–15)
BUN: 11 mg/dL (ref 6–20)
CO2: 25 mmol/L (ref 22–32)
Calcium: 9.1 mg/dL (ref 8.9–10.3)
Chloride: 105 mmol/L (ref 98–111)
Creatinine, Ser: 0.53 mg/dL (ref 0.44–1.00)
GFR calc Af Amer: >60 mL/min (ref >60)
GFR calc non Af Amer: >60 mL/min (ref >60)
Glucose, Bld: 113 mg/dL — ABNORMAL HIGH (ref 70–99)
Potassium: 3.5 mmol/L (ref 3.5–5.1)
Sodium: 139 mmol/L (ref 135–145)

## 2019-02-13 LAB — D-DIMER, QUANTITATIVE: D-Dimer, Quant: 0.33 ug/mL-FEU (ref 0.00–0.50)

## 2019-02-13 LAB — TROPONIN I (HIGH SENSITIVITY): Troponin I (High Sensitivity): <2 ng/L (ref <18)

## 2019-02-13 MED ORDER — ONDANSETRON HCL 4 MG/2ML IJ SOLN
4.0000 mg | Freq: Once | INTRAMUSCULAR | Status: AC
  Administered 2019-02-13: 4 mg via INTRAVENOUS
  Filled 2019-02-13: qty 2

## 2019-02-13 MED ORDER — IOHEXOL 350 MG/ML SOLN
100.0000 mL | Freq: Once | INTRAVENOUS | Status: AC | PRN
  Administered 2019-02-13: 100 mL via INTRAVENOUS

## 2019-02-13 MED ORDER — SODIUM CHLORIDE 0.9 % IV BOLUS
1000.0000 mL | Freq: Once | INTRAVENOUS | Status: AC
  Administered 2019-02-13: 1000 mL via INTRAVENOUS

## 2019-02-13 NOTE — ED Provider Notes (Signed)
[facility]   [id number] 02/13/19 Arrival Time: 1451  CC: Multiple complaints  SUBJECTIVE: History from: patient. Jamie Lutz is a 24 y.o. female complains of left sided calf pain, LT sided forearm pain, SOB, and chest pain that began couple of weeks ago.  Denies a precipitating event or specific injury.  Localizes the pain to the LT calf, LT chest, left forearm.  Describes the pain as constant, dull, and achy in character.  Has tried OTC tylenol without relief.  Symptoms are made worse at night.  Denies similar symptoms in the past.  Denies fever, chills, erythema, ecchymosis, effusion, weakness, numbness and tingling.    ROS: As per HPI.  All other pertinent ROS negative.     Past Medical History:  Diagnosis Date  . Anxiety   . Depression   . Gestational diabetes    Past Surgical History:  Procedure Laterality Date  . PERINEAL LACERATION REPAIR  04/17/2011   Procedure: SUTURE REPAIR PERINEAL LACERATION;  Surgeon: Tilda Burrow, MD;  Location: [facility];  Service: Gynecology;  Laterality: N/A;   No Known Allergies No current facility-administered medications on file prior to encounter.   Current Outpatient Medications on File Prior to Encounter  Medication Sig Dispense Refill  . Ascorbic Acid (VITAMIN C PO) Take by mouth daily.    . Cetirizine HCl 10 MG CAPS Take 1 capsule (10 mg total) by mouth daily for 10 days. 10 capsule 0  . fluticasone (FLONASE) 50 MCG/ACT nasal spray Place 2 sprays into both nostrils daily. 16 g 0  . ibuprofen (ADVIL) 800 MG tablet Take 1 tablet (800 mg total) by mouth 3 (three) times daily. 21 tablet 0  . lidocaine (LIDODERM) 5 % Place 1 patch onto the skin daily. Remove & Discard patch within 12 hours or as directed by MD 30 patch 0   Social History   Socioeconomic History  . Marital status: Married    Spouse name: Jacquenette Shone  . Number of children: 3  . Years of education: Not on file  . Highest education level: High school graduate   Occupational History  . Not on file  Tobacco Use  . Smoking status: Never Smoker  . Smokeless tobacco: Never Used  Substance and Sexual Activity  . Alcohol use: No    Alcohol/week: 0.0 standard drinks  . Drug use: No  . Sexual activity: Not Currently    Birth control/protection: None  Other Topics Concern  . Not on file  Social History Narrative  . Not on file   Social Determinants of Health   Financial Resource Strain:   . Difficulty of Paying Living Expenses: Not on file  Food Insecurity:   . Worried About Programme researcher, broadcasting/film/video in the Last Year: Not on file  . Ran Out of Food in the Last Year: Not on file  Transportation Needs:   . Lack of Transportation (Medical): Not on file  . Lack of Transportation (Non-Medical): Not on file  Physical Activity:   . Days of Exercise per Week: Not on file  . Minutes of Exercise per Session: Not on file  Stress:   . Feeling of Stress : Not on file  Social Connections:   . Frequency of Communication with Friends and Family: Not on file  . Frequency of Social Gatherings with Friends and Family: Not on file  . Attends Religious Services: Not on file  . Active Member of Clubs or Organizations: Not on file  . Attends Banker  Meetings: Not on file  . Marital Status: Not on file  Intimate Partner Violence:   . Fear of Current or Ex-Partner: Not on file  . Emotionally Abused: Not on file  . Physically Abused: Not on file  . Sexually Abused: Not on file   Family History  Problem Relation Age of Onset  . Hypothyroidism Mother   . Diabetes Maternal Grandmother   . Cancer Maternal Grandfather        esophagus    OBJECTIVE:  Vitals:   02/13/19 1515  BP: 111/76  Pulse: 88  Resp: 18  Temp: 98.5 F (36.9 C)  SpO2: 98%    General appearance: ALERT; in no acute distress.  Head: NCAT ENT: PERRL, EOMI grossly; oropharynx clear Lungs: Normal respiratory effort; CTAB CV: RRR Musculoskeletal: extremities Inspection: Skin  warm, dry, clear and intact without obvious erythema, effusion, or ecchymosis.  Palpation: LT sided calf tenderness; no obvious swelling, erythema or asymmetry ROM: FROM active and passive Strength: 5/5 shld abduction, 5/5 shld adduction, 5/5 elbow flexion, 5/5 elbow extension, 5/5 grip strength, 5/5 hip flexion, 5/5 knee abduction, 5/5 knee adduction, 5/5 knee flexion, 5/5 knee extension Skin: warm and dry Neurologic: Ambulates without difficulty; Sensation intact about the upper/ lower extremities Psychological: alert and cooperative; anxious mood and affect   ASSESSMENT & PLAN:  1. Pain of left calf   2. Shortness of breath   3. Other chest pain    Patient complains of LT sided calf pain, SOB, and chest pain x couple of weeks.  Cannot rule out blood clot in urgent care setting.  Will go by private vehicle to ED for further evaluation and management.      Lestine Box, PA-C 02/13/19 1554

## 2019-02-13 NOTE — ED Triage Notes (Signed)
Pt seen at Urgent Care today and sent here to r/o DVT.  Pt while in triage states she has abd pain as well with nausea for a week.

## 2019-02-13 NOTE — ED Triage Notes (Signed)
Pt presents with c/o left calf pain and left arm pain for past 2 weeks, pt was seen in ED last week for left sided chest pain and states area is still sore, denies injury

## 2019-02-13 NOTE — ED Provider Notes (Signed)
[facility] EMERGENCY DEPARTMENT Provider Note   CSN: [id number] Arrival date & time: 02/13/19  1620    History Chief Complaint  Patient presents with  . Abdominal Pain  . r/o DVT    Jamie Lutz is a 24 y.o. female with medical history significant for Jamie Lutz, depression who presents for evaluation of multiple complaints.  Patient with left-sided chest pain.  She has been evaluated 6 times in the last month for similar complaints.  Pain has not resolved.  Located to left chest.  Pain not pleuritic or exertional in nature.  Pain does not radiate to left arm, left jaw.  No associated lightheadedness, dizziness, diaphoresis.  Patient states 3 days ago she noted left lateral calf pain.  Has not noticed any swelling, redness or warmth.  No prior history of PE, DVT.  No family history of sudden cardiac death or arrhythmias.  No recent surgery, immobilization, exogenous hormone use.  Patient states she has had nausea which began when she arrived to the emergency department today.  Denies chance of pregnancy.  Patient did have Covid-like illness with known Covid family members earlier in December.  Patient also states she is having some generalized body aches and pains as well.  No Cough.  She is tolerating p.o. intake without difficulty.  No fever, chills, headache, lightheadedness, dizziness, diarrhea or dysuria.  Has not take anything for symptoms.  She was previously referred to cardiology which she has not followed up with.  History obtained from patient and past medical records.  No interpreter is used.  HPI     Past Medical History:  Diagnosis Date  . Anxiety   . Depression   . Gestational diabetes     Patient Active Problem List   Diagnosis Date Noted  . Depression with anxiety 06/05/2016  . History of gestational diabetes 02/25/2016  . Adjustment disorder with anxious mood 11/03/2014  . 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 Chronic abdominal pain 05/03/2014  .  Constipation 05/03/2014  . Paresthesia 02/06/2014    Past Surgical History:  Procedure Laterality Date  . PERINEAL LACERATION REPAIR  04/17/2011   Procedure: SUTURE REPAIR PERINEAL LACERATION;  Surgeon: Tilda Burrow, MD;  Location: [facility];  Service: Gynecology;  Laterality: N/A;     OB History    Gravida  4   Para  4   Term  4   Preterm  0   AB  0   Living  4     SAB  0   TAB  0   Ectopic  0   Multiple  0   Live Births  4           Family History  Problem Relation Age of Onset  . Hypothyroidism Mother   . Diabetes Maternal Grandmother   . Cancer Maternal Grandfather        esophagus    Social History   Tobacco Use  . Smoking status: Never Smoker  . Smokeless tobacco: Never Used  Substance Use Topics  . Alcohol use: No    Alcohol/week: 0.0 standard drinks  . Drug use: No    Home Medications Prior to Admission medications   Medication Sig Start Date End Date Taking? Authorizing Provider  fluticasone (FLONASE) 50 MCG/ACT nasal spray Place 2 sprays into both nostrils daily. 01/20/19  Yes Wurst, [place], PA-C  lidocaine (LIDODERM) 5 % Place 1 patch onto the skin daily. Remove & Discard patch within 12 hours or as directed by MD  01/23/19  Yes Joy, Shawn C, PA-C  promethazine (PHENERGAN) 25 MG tablet Take 25 mg by mouth 2 (two) times daily as needed for nausea/vomiting. 02/07/19  Yes [provider]  Ascorbic Acid (VITAMIN C PO) Take by mouth daily.    [provider]  Cetirizine HCl 10 MG CAPS Take 1 capsule (10 mg total) by mouth daily for 10 days. 05/21/18 05/31/18  Wieters, Hallie C, PA-C    Allergies    Patient has no known allergies.  Review of Systems   Review of Systems  Constitutional: Positive for fatigue. Negative for activity change, appetite change, chills, diaphoresis, fever and unexpected weight change.  HENT: Negative.   Respiratory: Positive for shortness of breath. Negative for apnea, cough, choking, chest tightness,  wheezing and stridor.   Cardiovascular: Positive for chest pain (Not pleuritic or exhertional). Negative for palpitations and leg swelling.  Gastrointestinal: Positive for nausea. Negative for abdominal distention, abdominal pain, anal bleeding, blood in stool, constipation, diarrhea, rectal pain and vomiting.  Genitourinary: Negative.   Musculoskeletal: Negative for back pain, gait problem, neck pain and neck stiffness.       Left calf pain  Skin: Negative.   Neurological: Negative.   All other systems reviewed and are negative.   Physical Exam Updated Vital Signs BP 125/71 (BP Location: Right Arm)   Pulse 93   Temp 98.2 F (36.8 C) (Oral)   Resp 14   Ht 5\' 4"  (1.626 m)   Wt 67.1 kg   LMP 02/02/2019   SpO2 100%   BMI 25.40 kg/m   Physical Exam Vitals and nursing note reviewed.  Constitutional:      General: She is not in acute distress.    Appearance: She is well-developed. She is not ill-appearing, toxic-appearing or diaphoretic.  HENT:     Head: Normocephalic and atraumatic.     Mouth/Throat:     Mouth: Mucous membranes are moist.  Eyes:     Pupils: Pupils are equal, round, and reactive to light.  Cardiovascular:     Rate and Rhythm: Normal rate.     Pulses:          Dorsalis pedis pulses are 2+ on the right side and 2+ on the left side.       Posterior tibial pulses are 2+ on the right side and 2+ on the left side.     Heart sounds: Normal heart sounds.  Pulmonary:     Effort: Pulmonary effort is normal. No respiratory distress.     Breath sounds: Normal breath sounds.     Comments: Speaks in full sentences without difficulty.  Lungs clear to auscultation bilateral wheeze, rhonchi or rales. Chest:     Comments: No overlying skin changes, no crepitus, step-offs, erythema or warmth.  Some reproducible tenderness palpation to left upper chest wall Abdominal:     General: Bowel sounds are normal. There is no distension.     Palpations: Abdomen is soft.      Tenderness: There is no abdominal tenderness. There is no right CVA tenderness, left CVA tenderness, guarding or rebound. Negative signs include Murphy's sign and McBurney's sign.     Hernia: No hernia is present.     Comments: Soft, nontender without rebound or guarding.  No overlying skin changes.  Musculoskeletal:        General: Normal range of motion.     Cervical back: Normal range of motion.     Comments: Moves all 4 extremities without difficulty.  Mild  tenderness to left lateral calf however no overlying skin changes, no edema, erythema or warmth.  Compartments soft.  Skin:    General: Skin is warm and dry.     Capillary Refill: Capillary refill takes less than 2 seconds.     Comments: Brisk cap refill.  No edema, erythema or warmth.  Neurological:     Mental Status: She is alert.     Comments: Ambulatory in ED without difficulty.    ED Results / Procedures / Treatments   Labs (all labs ordered are listed, but only abnormal results are displayed) Labs Reviewed  BASIC METABOLIC PANEL - Abnormal; Notable for the following components:      Result Value   Glucose, Bld 113 (*)    All other components within normal limits  URINALYSIS, ROUTINE W REFLEX MICROSCOPIC - Abnormal; Notable for the following components:   APPearance HAZY (*)    Leukocytes,Ua TRACE (*)    Bacteria, UA RARE (*)    All other components within normal limits  CBC WITH DIFFERENTIAL/PLATELET  D-DIMER, QUANTITATIVE (NOT AT [facility])  TROPONIN I (HIGH SENSITIVITY)    EKG EKG Interpretation  Date/Time:  Sunday February 13 2019 17:48:29 EST Ventricular Rate:  70 PR Interval:    QRS Duration: 87 QT Interval:  391 QTC Calculation: 422 R Axis:   72 Text Interpretation: Sinus rhythm Baseline wander Confirmed by Cathren LaineSteinl, Kevin ([id number]) on 02/13/2019 6:02:47 PM   Radiology CT Angio Chest PE W and/or Wo Contrast  Result Date: 02/13/2019 CLINICAL DATA:  Shortness of breath, LEFT-sided chest pain, LEFT lower leg  pain for 2 weeks. EXAM: CT ANGIOGRAPHY CHEST WITH CONTRAST TECHNIQUE: Multidetector CT imaging of the chest was performed using the standard protocol during bolus administration of intravenous contrast. Multiplanar CT image reconstructions and MIPs were obtained to evaluate the vascular anatomy. CONTRAST:  100mL OMNIPAQUE IOHEXOL 350 MG/ML SOLN COMPARISON:  None. FINDINGS: Cardiovascular: There is no pulmonary embolism identified within the main, lobar or segmental pulmonary arteries bilaterally. Heart size is normal. No pericardial effusion. No thoracic aortic aneurysm or evidence of aortic dissection. Mediastinum/Nodes: No mass or enlarged lymph nodes seen within the mediastinum or perihilar regions. Esophagus is unremarkable. Trachea and central bronchi are unremarkable. Lungs/Pleura: Lungs are clear. No pleural effusion or pneumothorax. Upper Abdomen: Limited images of the upper abdomen are unremarkable. Musculoskeletal: No acute or suspicious osseous finding. Review of the MIP images confirms the above findings. IMPRESSION: Normal exam. No pulmonary embolism. Electronically Signed   By: Bary RichardStan  Maynard M.D.   On: 02/13/2019 18:47   DG Chest Portable 1 View  Result Date: 02/13/2019 CLINICAL DATA:  Chest pain. EXAM: PORTABLE CHEST 1 VIEW COMPARISON:  February 05, 2019 FINDINGS: The heart size and mediastinal contours are within normal limits. Both lungs are clear. The visualized skeletal structures are unremarkable. IMPRESSION: No active disease. Electronically Signed   By: Aram Candelahaddeus  Houston M.D.   On: 02/13/2019 17:22    Procedures Procedures (including critical care time)  Medications Ordered in ED Medications  sodium chloride 0.9 % bolus 1,000 mL (0 mLs Intravenous Stopped 02/13/19 1800)  ondansetron (ZOFRAN) injection 4 mg (4 mg Intravenous Given 02/13/19 1702)  iohexol (OMNIPAQUE) 350 MG/ML injection 100 mL (100 mLs Intravenous Contrast Given 02/13/19 1818)    ED Course  I have reviewed the  triage vital signs and the nursing notes.  Pertinent labs & imaging results that were available during my care of the patient were reviewed by me and considered in my medical decision  making (see chart for details).  57 old female peers otherwise well presents for evaluation of chest pain, shortness of breath and left calf pain.  She is afebrile, nonseptic, not ill-appearing.  Seen by urgent care sent to ED to rule out PE and DVT.  This is patient's sixth visit in the last month for left-sided chest pain.  Previously thought due to upper respiratory infection.  Patient denies any cough.  She has noted some shortness of breath.  Her heart and lungs are clear.  Heart rate in the high 90s, low 100s in the room.  She does not appear in any acute respiratory distress.  Abdomen soft.  Some mild tenderness to left lateral calf however I do not appreciate any edema, erythema or warmth.  No OCPs, recent surgery or malignancy.  Personal history of PE or DVT however states she does have a family member that has a clotting disorder that she does not know the name of.  Shared decision making with patient given negative D-dimer however she does have this chest pain new onset shortness of breath and left calf pain letter to obtain CT scan chest.  Discussed risk versus benefit.  Patient was understanding and would like CT scan of chest to ensure no PE.  Discussed with patient if this is negative she will still need to return tomorrow to have ultrasound done to rule out DVT of her left lower extremity.  Labs and imaging personally reviewed and interpreted:  Clinical Course as of Feb 13 1907  Sun Feb 13, 2019  1744 No leukocytosis, hemoglobin 13.7  CBC with Differential [BH]  1745 WNL  D-dimer, quantitative (not at [facility]) [BH]  1745 Trace leuks, rare bacteria, Denies UTI symptoms. Will culture  Urinalysis, Routine w reflex microscopic(!) [BH]  1746 No infiltrate, cardiomegaly, pulmonary edema, pneumothorax.  DG  Chest Portable 1 View [BH]  1756 Mild hyperglycemia no additional electrolyte, renal or liver abnormaility  Basic metabolic panel(!) [BH]  4315 negative  Troponin I (High Sensitivity) [BH]    Clinical Course User Index [BH] Bianca Vester A, PA-C   Patient has appoint with cardiology on Wednesday. Discussed with patient to keep her appointment given she has had pain greater than 1 month with multiple ED visits.  She will return tomorrow for Doppler left lower extremity rule out DVT.  No evidence of PE on CT chest.  Troponins negative.  No EKG changes.  She looks otherwise well.  No tachycardia, tachypnea or hypoxia.  Patient is to be discharged with recommendation to follow up with PCP in regards to today's hospital visit. Chest pain is not likely of cardiac or pulmonary etiology d/t presentation, PERC negative, VSS, no tracheal deviation, no JVD or new murmur, RRR, breath sounds equal bilaterally, EKG without acute abnormalities, negative troponin, and negative CXR. Pt has been advised to return to the ED if CP becomes exertional, associated with diaphoresis or nausea, radiates to left jaw/arm, worsens or becomes concerning in any way. Pt appears reliable for follow up and is agreeable to discharge.   The patient has been appropriately medically screened and/or stabilized in the ED. I have low suspicion for any other emergent medical condition which would require further screening, evaluation or treatment in the ED or require inpatient management.   MDM Rules/Calculators/A&P                      Jamie Lutz was evaluated in Emergency Department on 02/13/2019 for the symptoms described  in the history of present illness. She was evaluated in the context of the global COVID-19 pandemic, which necessitated consideration that the patient might be at risk for infection with the SARS-CoV-2 virus that causes COVID-19. Institutional protocols and algorithms that pertain to the evaluation of  patients at risk for COVID-19 are in a state of rapid change based on information released by regulatory bodies including the CDC and federal and state organizations. These policies and algorithms were followed during the patient's care in the ED. Final Clinical Impression(s) / ED Diagnoses Final diagnoses:  Pain of left calf  Precordial pain    Rx / DC Orders ED Discharge Orders         Ordered    US Venous Img Lower Unilateral Left (DVT)     02/13/19 1902           Linwood Dibbles, PA-C 02/13/19 1909    Cathren Laine, MD 02/13/19 2211

## 2019-02-13 NOTE — ED Notes (Signed)
Pt given instructions on [place] appointment and how to call for appointment. Pt verbalized understanding. Ambulated out to lobby . A&Ox4

## 2019-02-13 NOTE — Discharge Instructions (Signed)
Your appointment with the cardiologist on Wednesday.  You will need to return tomorrow for your ultrasound of your left calf.  Return to the emergency department for any new worsening symptoms.

## 2019-02-13 NOTE — Discharge Instructions (Signed)
Patient complains of LT sided calf pain, SOB, and chest pain x couple of weeks.  Cannot rule out blood clot in urgent care setting.  Will go by private vehicle to ED for further evaluation and management.

## 2019-02-16 ENCOUNTER — Ambulatory Visit: Payer: Self-pay | Admitting: Cardiology

## 2019-02-16 ENCOUNTER — Other Ambulatory Visit: Payer: Self-pay

## 2019-02-17 ENCOUNTER — Ambulatory Visit (HOSPITAL_COMMUNITY)
Admission: RE | Admit: 2019-02-17 | Discharge: 2019-02-17 | Disposition: A | Discharge status: Home / Self Care | Payer: Self-pay | Source: Ambulatory Visit | Attending: Physician Assistant | Admitting: Physician Assistant

## 2019-02-17 DIAGNOSIS — R600 Localized edema: Secondary | ICD-10-CM | POA: Insufficient documentation

## 2019-02-17 NOTE — ED Provider Notes (Signed)
Patient returns today for outpatient ultrasound to rule out DVT.  I actually personally evaluated the patient 4 days ago for chest pain and left calf pain.  She had negative troponins, negative CTA chest at that time.  She had a reassuring EKG and chest x-ray.  Her ultrasound here is negative for DVT. Calf pain resolved. Denies swelling, paresthesias or overlying skin changes. She is to follow-up with cardiology as previously planned for her chronic chest pain which she has had for greater than 6 weeks.   Linwood Dibbles, PA-C 02/17/19 1332    Bethann Berkshire, MD 02/17/19 1510

## 2019-03-10 ENCOUNTER — Encounter: Payer: Self-pay | Admitting: Physician Assistant

## 2019-03-10 ENCOUNTER — Ambulatory Visit: Payer: Self-pay | Admitting: Physician Assistant

## 2019-03-10 DIAGNOSIS — F419 Anxiety disorder, unspecified: Secondary | ICD-10-CM

## 2019-03-10 DIAGNOSIS — Z7689 Persons encountering health services in other specified circumstances: Secondary | ICD-10-CM

## 2019-03-10 DIAGNOSIS — Z3009 Encounter for other general counseling and advice on contraception: Secondary | ICD-10-CM
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
## 2019-03-15 ENCOUNTER — Ambulatory Visit: Payer: Self-pay | Admitting: Physician Assistant

## 2019-03-15 DIAGNOSIS — Z3009 Encounter for other general counseling and advice on contraception: Secondary | ICD-10-CM

## 2019-03-15 DIAGNOSIS — K219 Gastro-esophageal reflux disease without esophagitis: Secondary | ICD-10-CM

## 2019-03-15 DIAGNOSIS — F419 Anxiety disorder, unspecified: Secondary | ICD-10-CM

## 2019-03-15 MED ORDER — OMEPRAZOLE 40 MG PO CPDR: 40.0000 mg | DELAYED_RELEASE_CAPSULE | Freq: Every day | ORAL | 3 refills | Status: DC

## 2019-03-15 MED ORDER — FLUOXETINE HCL 20 MG PO CAPS: 20.0000 mg | ORAL_CAPSULE | Freq: Every day | ORAL | 0 refills | Status: DC

## 2019-03-15 NOTE — Progress Notes (Signed)
There were no vitals taken for this visit.   Subjective:    Patient ID: Jamie Lutz, female    DOB: [birth year], 24 y.o.   MRN: [id number]  HPI: Jamie Lutz is a 24 y.o. female presenting on 03/15/2019 for No chief complaint on file.   HPI   This is a telemedicine appointment through [employer] due to coronavirus pandemic.  I connected with  Jamie Lutz on 03/15/19 by a video enabled telemedicine application and verified that I am speaking with the correct person using two identifiers.   I discussed the limitations of evaluation and management by telemedicine. The patient expressed understanding and agreed to proceed.   Pt is at home.  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     Pt c/o epigastric pain that started Saturday or Sunday.  Pain is constant.  Nothing makes it better or worse.   No vomiting.  No diarrhea.  She is moving her BMs normallly.    No fever.  No cough.  She is eating normally.   She called [facility] and was told to do a walk-in but she hasn't done that yet.  Pt is now wanting to try something not the paxil.  She wants to take prozac which she   Was on in the past.    She has not started taking the paxil that was prescribed previously.    Relevant past medical, surgical, family and social history reviewed and updated as indicated. Interim medical history since our last visit reviewed. Allergies and medications reviewed and updated.   Current Outpatient Medications:  .  hydrOXYzine (ATARAX/VISTARIL) 25 MG tablet, Take 25 mg by mouth 3 (three) times daily as needed., Disp: , Rfl:  .  Multiple Vitamin (MULTIVITAMIN) tablet, Take 1 tablet by mouth daily., Disp: , Rfl:  .  Omega-3 Fatty Acids (OMEGA 3 PO), Take by mouth., Disp: , Rfl:  .  Ascorbic Acid (VITAMIN C PO), Take by mouth daily., Disp: , Rfl:  .  PARoxetine HCl (PAXIL PO), Take by mouth., Disp: , Rfl:      Review of Systems  Per HPI unless specifically indicated above      Objective:    There were no vitals taken for this visit.  Wt Readings from Last 3 Encounters:  02/13/19 148 lb (67.1 kg)  03/13/18 190 lb (86.2 kg)  03/11/18 188 lb (85.3 kg)    Physical Exam Constitutional:      General: She is not in acute distress.    Appearance: Normal appearance. She is not ill-appearing.  HENT:     Head: Normocephalic and atraumatic.  Pulmonary:     Effort: Pulmonary effort is normal. No respiratory distress.  Neurological:     Mental Status: She is alert and oriented to person, place, and time.  Psychiatric:        Attention and Perception: Attention normal.        Mood and Affect: Mood is anxious.        Speech: Speech normal.        Behavior: Behavior normal. Behavior is cooperative.          Assessment & Plan:    Encounter Diagnoses  Name Primary?  Marland Kitchen Anxiety Yes  . Gastroesophageal reflux disease, unspecified whether esophagitis present   . Birth control counseling       She wants prozac (and not paxil).  Pt to go to [facility]. As discussed.   rx omeprazole..  -   She is  remindd to use condoms to prevent pregnancy on medicaitons  F/u 03/29/19 as scheduled

## 2019-03-20 ENCOUNTER — Encounter: Payer: Self-pay | Admitting: Physician Assistant

## 2019-03-21 ENCOUNTER — Telehealth: Payer: Self-pay | Admitting: Student

## 2019-03-21 NOTE — Telephone Encounter (Signed)
[place] Salts, LPN  [place], Rexford Maus, RN  [place],   This patient needs mental health assistance; she was referred to [facility] and was not able to re-establish care with them because "she is not a [place] citizen" and they cannot not use her SSN because they do not take a worker's permit SSN (pt is a Act[employer] recipient).   Could you please contact [place] to help her get established for mental health? She has been dealing with anxiety.   Thanks,   [employer], [place]  [address]  [place] [address]  [phone]   Staff message sent to Shann Medal on 03-21-19

## 2019-03-23 ENCOUNTER — Telehealth: Payer: Self-pay | Admitting: Student

## 2019-03-23 NOTE — Telephone Encounter (Signed)
Pt called and left vm on nurse's line reporting stomach pain, cp, pain and swelling on hands and feet, and nausea.  LPN called pt back and left vm for pt to call the office to discuss concerns.

## 2019-03-24 ENCOUNTER — Encounter: Payer: Self-pay | Admitting: Physician Assistant

## 2019-03-24 ENCOUNTER — Ambulatory Visit: Payer: Self-pay | Admitting: Physician Assistant

## 2019-03-24 DIAGNOSIS — F419 Anxiety disorder, unspecified: Secondary | ICD-10-CM

## 2019-03-24 DIAGNOSIS — F418 Other specified anxiety disorders: Secondary | ICD-10-CM

## 2019-03-24 DIAGNOSIS — Z3009 Encounter for other general counseling and advice on contraception: Secondary | ICD-10-CM

## 2019-03-24 DIAGNOSIS — R1013 Epigastric pain: Secondary | ICD-10-CM

## 2019-03-24 MED ORDER — HYDROXYZINE HCL 25 MG PO TABS: 25.0000 mg | ORAL_TABLET | Freq: Three times a day (TID) | ORAL | 0 refills | Status: DC | PRN

## 2019-03-24 NOTE — Progress Notes (Signed)
There were no vitals taken for this visit.   Subjective:    Patient ID: Jamie Lutz, female    DOB: Sep 20, 1995, 24 y.o.   MRN: [id number]  Jamie Lutz is a 24 y.o. female presenting on 03/24/2019 for No chief complaint on file.   HPI    This is a telemedicine appointment through [employer] due to coronavirus pandemic.    I connected with  Jamie Lutz on 03/24/19 by a video enabled telemedicine application and verified that I am speaking with the correct person using two identifiers.   I discussed the limitations of evaluation and management by telemedicine. The patient expressed understanding and agreed to proceed.  Pt is at home.    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      Pt is [name] with multiple visits to ER and [facility] over the past 2 months.  Today she wanted appointment for abdominal pain.  Note from nurse yesterday reviewed.     Pt was prescribed rx for anxiety on 03/10/19 (her first appointment with [facility]) but says she only started her medication 3 days ago.    Today Pt c/o sharp pain in the middle of her abd.  The pain is constanct.   She is feeling nauseous.  She wakens from sleep "with a panic".    Pt says she has hemorrhoids.  She feels urge but doesn't move BMs.   She has BM usually 5 days/week.    She started her prozac 3 days ago.    She is able to eat.  She says it makes her nauseous.    She denies fever.    No emesis.    She denies SI.  She denies feeling like she wants to hurt her children (she has 4 small children).  She does feel sad.   She is still grieving for her step-father who died of covid.  She has been praying a lot and writing in her journal.   She says praying and writing in her journal are helpful.    She also c/o pains in her hands.  It hurts her to wash the dishes.    Pt has had difficulties with accessing mental health care (described in telephone note yesterday with nurse)       Relevant past medical, surgical,  family and social history reviewed and updated as indicated. Interim medical history since our last visit reviewed. Allergies and medications reviewed and updated.    Current Outpatient Medications:  .  FLUoxetine (PROZAC) 20 MG capsule, Take 1 capsule (20 mg total) by mouth daily., Disp: 30 capsule, Rfl: 0 .  Multiple Vitamin (MULTIVITAMIN) tablet, Take 1 tablet by mouth daily., Disp: , Rfl:  .  Omega-3 Fatty Acids (OMEGA 3 PO), Take by mouth., Disp: , Rfl:  .  omeprazole (PRILOSEC) 40 MG capsule, Take 1 capsule (40 mg total) by mouth daily., Disp: 30 capsule, Rfl: 3 .  Ascorbic Acid (VITAMIN C PO), Take by mouth daily., Disp: , Rfl:  .  hydrOXYzine (ATARAX/VISTARIL) 25 MG tablet, Take 25 mg by mouth 3 (three) times daily as needed., Disp: , Rfl:      Review of Systems  Per HPI unless specifically indicated above     Objective:    There were no vitals taken for this visit.  Wt Readings from Last 3 Encounters:  02/13/19 148 lb (67.1 kg)  03/13/18 190 lb (86.2 kg)  03/11/18 188 lb (85.3 kg)    Physical Exam Constitutional:  General: She is not in acute distress.    Appearance: Normal appearance. She is not toxic-appearing.  HENT:     Head: Normocephalic and atraumatic.  Pulmonary:     Effort: Pulmonary effort is normal. No respiratory distress.  Neurological:     Mental Status: She is alert and oriented to person, place, and time.  Psychiatric:        Attention and Perception: Attention normal.        Mood and Affect: Affect is flat.        Speech: Speech normal.            Assessment & Plan:    Encounter Diagnoses  Name Primary?  Marland Kitchen Anxiety Yes  . Epigastric pain   . Birth control counseling   . Depression with anxiety      Pt is calling MH and hasn't gotten them on phone yet.  She is encouraged to continue trying.   Pt is to continue prozac.  She is given refill of hydroxyzine.  Discussed with pt that she needs MH specialist to continue her psych  meds.  Encouraged pt to go to ER for ANXIETY if her mental problems become more bothersome.  She is told specifically to tell them that she is there for anxiety and not for the lengthy list of physical complaints.     Increase omeprole to bid.    Pt is reminded to avoid getting pregnant on rx meds.  She is currently using no contraception.  She is reminded that she can get IUD at no cost if she will but she should at least use condoms.  She agrees.    Pt has her 1 month follow up to her initial OV next week.

## 2019-03-24 NOTE — Telephone Encounter (Signed)
Pt called back and states her stomach pain feels like stabbing in the middle of her stomach which started 03-22-19 in the evening. Pt states she has been taking omeprazole as prescribed at 03-15-19 OV with PA. Pt states omeprazole helps a little. Pt states pain kept her up during the night and was not able to sleep.  When asked if this was the same pain she was experiencing at her appt on 03-15-19 pt states it feels more like stabbing pain. Pt states she has experienced this pain in the past and it comes and goes. Pt states in the past she also felt like her body was shaking, weakness, and felt cold sweats. Pt states she feels a little of that now.  Pt states she also started feeling nauseous 03-22-19 in the evening and hasn't been able to eat anything but a few crackers and coconut water. No vomiting. Pt states she has medication for nausea that was given to her by a Medical facillity in [place] when she went on 01/2019, but has not taken any. Pt unsure of medication name.  Pt states her hands hurt and are swollen. Sx also started 03-22-19 in the evening and swelling diminished around 8 am on 03-23-19. Pain is still present.  Pt states that abd pain, nausea, and hand pain does not seem to limit her ability to do things. Pt also mentions feeling depressed.  LPN discussed with patient that Nurse at [facility] is to call her to get set up with Mental health as [facility] would not accept her as a pt. Pt states [facility] nurse has not contacted her yet, so pt was given [facility] Nurse's phone number for pt to contact her. Pt verbalized understanding.  LPN discussed with pt that her anxiety may be contributing to her symptoms and if she is needing immediate mental health evaluation to seek ER. Pt understands and agrees that her anxiety is not helping with her symptoms and states she does not think she needs ER treatment at this time, but would like to scheduled appt with PA.  LPN scheduled pt appt with PA for  03-24-19 by Evisit.  LPN encourages pt to seek ER treatment if she is in need of immediate mental health evaluation and was advised to make sure to notify medical staff that she is having anxiety and depression. Pt verbalized understanding.

## 2019-03-29 ENCOUNTER — Encounter: Payer: Self-pay | Admitting: Physician Assistant

## 2019-03-29 ENCOUNTER — Other Ambulatory Visit: Payer: Self-pay

## 2019-03-29 ENCOUNTER — Telehealth: Payer: Self-pay | Admitting: Pediatric Intensive Care

## 2019-03-29 ENCOUNTER — Ambulatory Visit: Payer: Self-pay | Admitting: Physician Assistant

## 2019-03-29 VITALS — BP 122/80 | HR 87 | Temp 98.2°F

## 2019-03-29 DIAGNOSIS — F419 Anxiety disorder, unspecified: Secondary | ICD-10-CM

## 2019-03-29 DIAGNOSIS — R109 Unspecified abdominal pain: Secondary | ICD-10-CM

## 2019-03-29 DIAGNOSIS — F418 Other specified anxiety disorders: Secondary | ICD-10-CM

## 2019-03-29 NOTE — Progress Notes (Signed)
BP 122/80   Pulse 87   Temp 98.2 F (36.8 C)   SpO2 99%    Subjective:    Patient ID: Jamie Lutz, female    DOB: [birth year], 24 y.o.   MRN: [id number]  HPI: Jamie Lutz is a 24 y.o. female presenting on 03/29/2019 for No chief complaint on file.   HPI  Pt had a negative covid 19 screening questionnaire.     Pt is [name] with significant anxiety and constitutional complaints.  She was evaluated multiple times in ER in January prior to establishing care with [facility].    She was recommended to establish with MH care.  Pt has still not been able to get in touch with MH.  She says she has tried calling them.  She says her anxiety is okay.   She says she feels worse when she can't move her bowels.  Sometimes she moves bowels daily and sometimes every other day.   She thinks her stool this morning was green or black; it was the first time she had this.    She is sleeping good some nights and some nights not so good.  She says she woke up at 3am to try to have a BM.    Pt says she has hx anxiety.  She had it even back when she was in school.  She says a little bit of depression.  No SI or HI.  She has only been taking the prozac for  A little while so it hasn't really had tie yet to take effect.  She is able to eat.  No emesis.  she Sometimes has a little bit nauseous.    She is using condoms now for contraception.     Relevant past medical, surgical, family and social history reviewed and updated as indicated. Interim medical history since our last visit reviewed. Allergies and medications reviewed and updated.   Current Outpatient Medications:  .  FLUoxetine (PROZAC) 20 MG capsule, Take 1 capsule (20 mg total) by mouth daily., Disp: 30 capsule, Rfl: 0 .  Multiple Vitamin (MULTIVITAMIN) tablet, Take 1 tablet by mouth daily., Disp: , Rfl:  .  Omega-3 Fatty Acids (OMEGA 3 PO), Take by mouth., Disp: , Rfl:  .  omeprazole (PRILOSEC) 40 MG capsule, Take 1 capsule (40 mg  total) by mouth daily., Disp: 30 capsule, Rfl: 3 .  Ascorbic Acid (VITAMIN C PO), Take by mouth daily., Disp: , Rfl:  .  hydrOXYzine (ATARAX/VISTARIL) 25 MG tablet, Take 1 tablet (25 mg total) by mouth 3 (three) times daily as needed for anxiety. (Patient not taking: Reported on 03/29/2019), Disp: 30 tablet, Rfl: 0    Review of Systems  Per HPI unless specifically indicated above     Objective:    BP 122/80   Pulse 87   Temp 98.2 F (36.8 C)   SpO2 99%   Wt Readings from Last 3 Encounters:  02/13/19 148 lb (67.1 kg)  03/13/18 190 lb (86.2 kg)  03/11/18 188 lb (85.3 kg)    Physical Exam Vitals reviewed.  Constitutional:      General: She is not in acute distress.    Appearance: Normal appearance. She is well-developed. She is not ill-appearing.  HENT:     Head: Normocephalic and atraumatic.  Cardiovascular:     Rate and Rhythm: Normal rate and regular rhythm.  Pulmonary:     Effort: Pulmonary effort is normal.     Breath sounds: Normal breath sounds.  Abdominal:     General: Bowel sounds are normal.     Palpations: Abdomen is soft. There is no mass.     Tenderness: There is generalized abdominal tenderness. There is no guarding or rebound.     Comments: Very mild generalized tenderness.    Musculoskeletal:     Cervical back: Neck supple.     Right lower leg: No edema.     Left lower leg: No edema.  Lymphadenopathy:     Cervical: No cervical adenopathy.  Skin:    General: Skin is warm and dry.  Neurological:     Mental Status: She is alert and oriented to person, place, and time.  Psychiatric:        Attention and Perception: Attention normal.        Mood and Affect: Mood is anxious.        Speech: Speech normal.        Behavior: Behavior normal. Behavior is cooperative.           Assessment & Plan:   Encounter Diagnoses  Name Primary?  Marland Kitchen Anxiety Yes  . Depression with anxiety   . Abdominal pain, unspecified abdominal location      -pt to Continue  prozac.  She is to Go to [facility].  Nurse contacted [facility] to facilitate this.  Pt Has hydroxyzine if needed  -pt to continue omeprazole bid.  She is to Increase water intake and add OTC Stool softener.   She is counseled to Increase fiber in diet.  She is given reading information on constipation.  Discussed with pt that some people are normal to have BM only qod.    -pt to follow up in 1 month.  She is to contact office sooner for any worsening or new symptoms.  Pt is in agreement with plan

## 2019-03-29 NOTE — Telephone Encounter (Signed)
Left HIPAA compliant message for client to call back at [phone] [facility] RN SN CNP [phone]

## 2019-03-29 NOTE — Patient Instructions (Signed)

## 2019-03-29 NOTE — Telephone Encounter (Signed)
Call from client to discuss [facility] resources. CN gave client numbers for [facility] and [facility] with explanation of services and cost of services. CN asked about COVID vaccine for client and her spouse. She said that they both had COVID in December. CN explained that they should get a vaccine after 90 days. CN will assist with registration and follow up with client on Thursday during clinic hours. Shann Medal RN BSN CNP [phone]

## 2019-05-04 ENCOUNTER — Ambulatory Visit: Payer: Self-pay | Admitting: Physician Assistant

## 2019-05-04 ENCOUNTER — Encounter: Payer: Self-pay | Admitting: Physician Assistant

## 2019-05-04 DIAGNOSIS — F329 Major depressive disorder, single episode, unspecified: Secondary | ICD-10-CM

## 2019-05-04 DIAGNOSIS — F32A Depression, unspecified: Secondary | ICD-10-CM

## 2019-05-04 DIAGNOSIS — Z9119 Patient's noncompliance with other medical treatment and regimen: Secondary | ICD-10-CM

## 2019-05-04 DIAGNOSIS — Z91199 Patient's noncompliance with other medical treatment and regimen due to unspecified reason: Secondary | ICD-10-CM

## 2019-05-04 DIAGNOSIS — K219 Gastro-esophageal reflux disease without esophagitis: Secondary | ICD-10-CM

## 2019-05-04 NOTE — Progress Notes (Signed)
   There were no vitals taken for this visit.   Subjective:    Patient ID: Jamie Lutz, female    DOB: 09-01-1995, 24 y.o.   MRN: [id number]  HPI: Jamie Lutz is a 24 y.o. female presenting on 05/04/2019 for No chief complaint on file.   HPI   This is a telemedicine appointment through [employer] due to coronavirus pandemic.  I connected with  MARYLENE MASEK on 05/04/19 by a video enabled telemedicine application and verified that I am speaking with the correct person using two identifiers.   I discussed the limitations of evaluation and management by telemedicine. The patient expressed understanding and agreed to proceed.  Pt is at home outside in her yard and provider is at office.    Pt is a healthy [name] who presented to office 2/18 and today is already her 5th appointment.  Pt is only using omeprazole qd.  She is supposed to be taking it bid.     She says she had just started it back aftyer having stopped it because she though it made her bp too low  She is seeing counseling - she's been and has follow up appointment scheduled  She says she Got covid vaccination yesterday at [facility]    Relevant past medical, surgical, family and social history reviewed and updated as indicated. Interim medical history since our last visit reviewed. Allergies and medications reviewed and updated.   curre med omep 40mg  qd   Review of Systems  Per HPI unless specifically indicated above     Objective:    There were no vitals taken for this visit.  Wt Readings from Last 3 Encounters:  02/13/19 148 lb (67.1 kg)  03/13/18 190 lb (86.2 kg)  03/11/18 188 lb (85.3 kg)    Physical Exam Constitutional:      General: She is not in acute distress.    Appearance: Normal appearance. She is normal weight. She is not ill-appearing.  HENT:     Head: Normocephalic and atraumatic.  Pulmonary:     Effort: Pulmonary effort is normal. No respiratory distress.   Neurological:     Mental Status: She is alert and oriented to person, place, and time.  Psychiatric:        Attention and Perception: Attention normal.        Speech: Speech normal.            Assessment & Plan:    Encounter Diagnoses  Name Primary?  03/13/18 Anxiety and depression Yes  . Gastroesophageal reflux disease, unspecified whether esophagitis present   . Non compliance with medical treatment     -Pt is encouraged to continue with mental health specialist -Pt encouraged to take the omeprazole bid as instructed.  Discussed with pt that it will not affect her bp. -Pt to follow up 3 months.  She is to contact office sooner prn

## 2019-05-05 ENCOUNTER — Ambulatory Visit: Payer: Self-pay

## 2019-05-13 ENCOUNTER — Other Ambulatory Visit: Payer: Self-pay

## 2019-05-13 ENCOUNTER — Ambulatory Visit
Admission: EM | Admit: 2019-05-13 | Discharge: 2019-05-13 | Disposition: A | Discharge status: Home / Self Care | Payer: Self-pay | Attending: Emergency Medicine | Admitting: Emergency Medicine

## 2019-05-13 DIAGNOSIS — R634 Abnormal weight loss: Secondary | ICD-10-CM | POA: Insufficient documentation

## 2019-05-13 DIAGNOSIS — R1084 Generalized abdominal pain: Secondary | ICD-10-CM | POA: Insufficient documentation

## 2019-05-13 LAB — POCT URINALYSIS DIP (MANUAL ENTRY)
Bilirubin, UA: NEGATIVE
Glucose, UA: NEGATIVE mg/dL
Ketones, POC UA: NEGATIVE mg/dL
Leukocytes, UA: NEGATIVE
Nitrite, UA: NEGATIVE
Protein Ur, POC: NEGATIVE mg/dL
Spec Grav, UA: 1.02 (ref 1.010–1.025)
Urobilinogen, UA: 0.2 E.U./dL
pH, UA: 6.5 (ref 5.0–8.0)

## 2019-05-13 NOTE — ED Provider Notes (Addendum)
[facility]   [id number] 05/13/19 Arrival Time: 1904  CC: ABDOMINAL DISCOMFORT  SUBJECTIVE:  Jamie Lutz is a 24 y.o. female with history of constipation  presented to the urgent care with a complaint of off-and-on abdominal pain for the past month.  Denies a precipitating event, trauma, close contacts with similar symptoms, recent travel or antibiotic use.  Localizes pain to generalized abdomen.  Was seen at the free clinic in [facility] and was prescribed omeprazole with mild relief.  Describes the pain as intermittent and achy in character. Reports symptom is worse if she does not eat.  States she was not trying to lose weight but have lost 20 pounds.  Currently is on grain free diet.  Denies alleviating or aggravating factors. Last BM 05/13/2019 and she is currently menstruating. Denies fever, chills, appetite changes, weight changes, nausea, vomiting, chest pain, SOB, diarrhea, hematochezia, melena, dysuria, difficulty urinating, increased frequency or urgency, flank pain, loss of bowel or bladder function, vaginal discharge, vaginal odor, vaginal bleeding, dyspareunia, pelvic pain.     Patient's last menstrual period was 05/11/2019.  ROS: As per HPI.  All other pertinent ROS negative.     Past Medical History:  Diagnosis Date  . Anxiety   . Depression   . Gestational diabetes    Past Surgical History:  Procedure Laterality Date  . PERINEAL LACERATION REPAIR  04/17/2011   Procedure: SUTURE REPAIR PERINEAL LACERATION;  Surgeon: Jonnie Kind, MD;  Location: [facility];  Service: Gynecology;  Laterality: N/A;   No Known Allergies No current facility-administered medications on file prior to encounter.   Current Outpatient Medications on File Prior to Encounter  Medication Sig Dispense Refill  . Ascorbic Acid (VITAMIN C PO) Take by mouth daily.    Marland Kitchen FLUoxetine (PROZAC) 20 MG capsule Take 1 capsule (20 mg total) by mouth daily. (Patient not taking: Reported on  05/04/2019) 30 capsule 0  . hydrOXYzine (ATARAX/VISTARIL) 25 MG tablet Take 1 tablet (25 mg total) by mouth 3 (three) times daily as needed for anxiety. (Patient not taking: Reported on 03/29/2019) 30 tablet 0  . Multiple Vitamin (MULTIVITAMIN) tablet Take 1 tablet by mouth daily.    . Omega-3 Fatty Acids (OMEGA 3 PO) Take by mouth.    Marland Kitchen omeprazole (PRILOSEC) 40 MG capsule Take 1 capsule (40 mg total) by mouth daily. 30 capsule 3   Social History   Socioeconomic History  . Marital status: Married    Spouse name: Shea Stakes  . Number of children: 3  . Years of education: Not on file  . Highest education level: High school graduate  Occupational History  . Not on file  Tobacco Use  . Smoking status: Never Smoker  . Smokeless tobacco: Never Used  Substance and Sexual Activity  . Alcohol use: No    Alcohol/week: 0.0 standard drinks  . Drug use: No  . Sexual activity: Yes    Birth control/protection: Condom  Other Topics Concern  . Not on file  Social History Narrative  . Not on file   Social Determinants of Health   Financial Resource Strain:   . Difficulty of Paying Living Expenses:   Food Insecurity:   . Worried About Charity fundraiser in the Last Year:   . Arboriculturist in the Last Year:   Transportation Needs:   . Film/video editor (Medical):   Marland Kitchen Lack of Transportation (Non-Medical):   Physical Activity:   . Days of Exercise per Week:   .  Minutes of Exercise per Session:   Stress:   . Feeling of Stress :   Social Connections:   . Frequency of Communication with Friends and Family:   . Frequency of Social Gatherings with Friends and Family:   . Attends Religious Services:   . Active Member of Clubs or Organizations:   . Attends Banker Meetings:   Marland Kitchen Marital Status:   Intimate Partner Violence:   . Fear of Current or Ex-Partner:   . Emotionally Abused:   Marland Kitchen Physically Abused:   . Sexually Abused:    Family History  Problem Relation Age of Onset    . Hypothyroidism Mother   . Diabetes Maternal Grandmother   . Cancer Maternal Grandfather        esophagus     OBJECTIVE:  Vitals:   05/13/19 1908  BP: 117/75  Pulse: 85  Resp: 17  Temp: 97.8 F (36.6 C)  TempSrc: Oral  SpO2: 98%    General appearance: Alert; NAD HEENT: NCAT.  Oropharynx clear.  Lungs: clear to auscultation bilaterally without adventitious breath sounds Heart: regular rate and rhythm.  Radial pulses 2+ symmetrical bilaterally Abdomen: soft, non-distended; normal active bowel sounds; non-tender to light.  Tender to deep palpation; nontender at McBurney's point; negative Murphy's sign; negative rebound; no guarding Back: no CVA tenderness Extremities: no edema; symmetrical with no gross deformities Skin: warm and dry Neurologic: normal gait Psychological: alert and cooperative; normal mood and affect  LABS: Results for orders placed or performed during the hospital encounter of 05/13/19 (from the past 24 hour(s))  POCT urinalysis dipstick     Status: Abnormal   Collection Time: 05/13/19  7:22 PM  Result Value Ref Range   Color, UA yellow yellow   Clarity, UA clear clear   Glucose, UA negative negative mg/dL   Bilirubin, UA negative negative   Ketones, POC UA negative negative mg/dL   Spec Grav, UA 8.502 7.741 - 1.025   Blood, UA large (A) negative   pH, UA 6.5 5.0 - 8.0   Protein Ur, POC negative negative mg/dL   Urobilinogen, UA 0.2 0.2 or 1.0 E.U./dL   Nitrite, UA Negative Negative   Leukocytes, UA Negative Negative    DIAGNOSTIC STUDIES: No results found.   ASSESSMENT & PLAN:  1. Generalized abdominal pain   2. Unintended weight loss     No orders of the defined types were placed in this encounter.  POC urine analysis was inconclusive for UTI.  Was unable to rule out other disease process in the urgent care.  Patient was advised to go to ED for further evaluation   Discharge instruction  Patient was advised to go to ED for further  evaluation  Reviewed expectations re: course of current medical issues. Questions answered. Outlined signs and symptoms indicating need for more acute intervention. Patient verbalized understanding. After Visit Summary given.       Durward Parcel, FNP 05/13/19 1946

## 2019-05-13 NOTE — ED Triage Notes (Signed)
Pt presents with c/o mid abdominal pain that has been for past couple months and has been prescribe omeprazole  2 months ago

## 2019-05-13 NOTE — Discharge Instructions (Addendum)
POCT urine analysis was inconclusive for UTI. Patient was advised to go to ED for further evaluation

## 2019-05-15 LAB — URINE CULTURE: Culture: 30000 — AB

## 2019-05-16 ENCOUNTER — Other Ambulatory Visit: Payer: Self-pay

## 2019-05-16 ENCOUNTER — Telehealth: Payer: Self-pay | Admitting: Student

## 2019-05-16 ENCOUNTER — Other Ambulatory Visit: Payer: Self-pay | Admitting: Student

## 2019-05-16 ENCOUNTER — Ambulatory Visit: Payer: Self-pay | Attending: Internal Medicine

## 2019-05-16 DIAGNOSIS — Z20822 Contact with and (suspected) exposure to covid-19: Secondary | ICD-10-CM | POA: Insufficient documentation

## 2019-05-16 DIAGNOSIS — R1012 Left upper quadrant pain: Secondary | ICD-10-CM

## 2019-05-16 NOTE — Telephone Encounter (Signed)
Pt called today, 05-16-19 c/o LUQ abd pain that started on Wednesday, 05-11-19. Pt states she was seen for this on Friday, 05-13-19 at Urgent care on [address] In [place] and was advised to go to ER as they were not able to do proper imaging. Pt c/o constant stabbing feeling on LUQ which feels worse after eating. Pt states pain radiates from her abd to her rectum. Pt c/o nausea and feeling tired. Pt states she is having normal BM with no constipation no vomiting or diarrhea. Pt rates pain at 7 on a scale of 1-10. Pt states she has been taking omeprazole which has helped with her reflux but feels this pain is different than previous abd pains.  PA reviewed urgent care notes and CT abd ordered. Pt is to be sure to be taking omeprazole bid and to continue treatment with MH counselor. Pt to take OTC tums or maalox to help with symptoms and is to seek ER treatment if pain gets worse before CT exam.  Pt states she has been taking omeprazole 40mg  BID and last spoke with her counselor last Friday, 05-13-19. Pt states she speaks with her counselor about every 2 weeks.  CT scheduled for Wed. 06-01-2019 at 10am Arrival time 9:45am and pt is to be NPO 4 hours prior and is to pick up contrast no later than the day before test. Pt verbalized understanding and is aware of time, date and location of CT appt.

## 2019-05-17 LAB — NOVEL CORONAVIRUS, NAA: SARS-CoV-2, NAA: NOT DETECTED

## 2019-05-17 LAB — SARS-COV-2, NAA 2 DAY TAT

## 2019-05-21 ENCOUNTER — Emergency Department (HOSPITAL_COMMUNITY): Payer: Self-pay

## 2019-05-21 ENCOUNTER — Encounter (HOSPITAL_COMMUNITY): Payer: Self-pay | Admitting: Emergency Medicine

## 2019-05-21 ENCOUNTER — Other Ambulatory Visit: Payer: Self-pay

## 2019-05-21 ENCOUNTER — Emergency Department (HOSPITAL_COMMUNITY)
Admission: EM | Admit: 2019-05-21 | Discharge: 2019-05-21 | Disposition: A | Discharge status: Home / Self Care | Payer: Self-pay | Attending: Emergency Medicine | Admitting: Emergency Medicine

## 2019-05-21 DIAGNOSIS — R1012 Left upper quadrant pain: Secondary | ICD-10-CM | POA: Insufficient documentation

## 2019-05-21 DIAGNOSIS — Z79899 Other long term (current) drug therapy: Secondary | ICD-10-CM | POA: Insufficient documentation

## 2019-05-21 LAB — URINALYSIS, ROUTINE W REFLEX MICROSCOPIC
Bilirubin Urine: NEGATIVE
Glucose, UA: NEGATIVE mg/dL
Hgb urine dipstick: NEGATIVE
Ketones, ur: NEGATIVE mg/dL
Leukocytes,Ua: NEGATIVE
Nitrite: NEGATIVE
Protein, ur: NEGATIVE mg/dL
Specific Gravity, Urine: 1.003 — ABNORMAL LOW (ref 1.005–1.030)
pH: 7 (ref 5.0–8.0)

## 2019-05-21 LAB — CBC WITH DIFFERENTIAL/PLATELET
Abs Immature Granulocytes: 0.02 10*3/uL (ref 0.00–0.07)
Basophils Absolute: 0.1 10*3/uL (ref 0.0–0.1)
Basophils Relative: 1 %
Eosinophils Absolute: 0.1 10*3/uL (ref 0.0–0.5)
Eosinophils Relative: 2 %
HCT: 42 % (ref 36.0–46.0)
Hemoglobin: 13.5 g/dL (ref 12.0–15.0)
Immature Granulocytes: 0 %
Lymphocytes Relative: 32 %
Lymphs Abs: 2 10*3/uL (ref 0.7–4.0)
MCH: 28.3 pg (ref 26.0–34.0)
MCHC: 32.1 g/dL (ref 30.0–36.0)
MCV: 88.1 fL (ref 80.0–100.0)
Monocytes Absolute: 0.4 10*3/uL (ref 0.1–1.0)
Monocytes Relative: 7 %
Neutro Abs: 3.7 10*3/uL (ref 1.7–7.7)
Neutrophils Relative %: 58 %
Platelets: 186 10*3/uL (ref 150–400)
RBC: 4.77 MIL/uL (ref 3.87–5.11)
RDW: 12.8 % (ref 11.5–15.5)
WBC: 6.3 10*3/uL (ref 4.0–10.5)
nRBC: 0 % (ref 0.0–0.2)

## 2019-05-21 LAB — COMPREHENSIVE METABOLIC PANEL
ALT: 15 U/L (ref 0–44)
AST: 18 U/L (ref 15–41)
Albumin: 4.2 g/dL (ref 3.5–5.0)
Alkaline Phosphatase: 66 U/L (ref 38–126)
Anion gap: 10 (ref 5–15)
BUN: 10 mg/dL (ref 6–20)
CO2: 24 mmol/L (ref 22–32)
Calcium: 9.1 mg/dL (ref 8.9–10.3)
Chloride: 104 mmol/L (ref 98–111)
Creatinine, Ser: 0.63 mg/dL (ref 0.44–1.00)
GFR calc Af Amer: >60 mL/min (ref >60)
GFR calc non Af Amer: >60 mL/min (ref >60)
Glucose, Bld: 104 mg/dL — ABNORMAL HIGH (ref 70–99)
Potassium: 3.7 mmol/L (ref 3.5–5.1)
Sodium: 138 mmol/L (ref 135–145)
Total Bilirubin: 0.5 mg/dL (ref 0.3–1.2)
Total Protein: 7.4 g/dL (ref 6.5–8.1)

## 2019-05-21 LAB — HCG, QUANTITATIVE, PREGNANCY: hCG, Beta Chain, Quant, S: <1 m[IU]/mL (ref <5)

## 2019-05-21 LAB — LIPASE, BLOOD: Lipase: 30 U/L (ref 11–51)

## 2019-05-21 MED ORDER — SODIUM CHLORIDE 0.9 % IV BOLUS
1000.0000 mL | Freq: Once | INTRAVENOUS | Status: AC
  Administered 2019-05-21: 11:00:00 1000 mL via INTRAVENOUS

## 2019-05-21 MED ORDER — IOHEXOL 300 MG/ML  SOLN
100.0000 mL | Freq: Once | INTRAMUSCULAR | Status: AC | PRN
  Administered 2019-05-21: 09:00:00 100 mL via INTRAVENOUS

## 2019-05-21 MED ORDER — SODIUM CHLORIDE 0.9 % IV SOLN: INTRAVENOUS | Status: DC

## 2019-05-21 NOTE — Discharge Instructions (Addendum)
Continue to take your omeprazole.  It is okay to take your other medicines.  Tylenol is okay for pain.  Make an appointment to follow-up with gastroenterology I think it is time that you have an upper endoscopy done to take a closer look at what is going on in the stomach and the esophagus area.  Today's work-up CT scan and labs without any significant abnormalities.

## 2019-05-21 NOTE — ED Triage Notes (Signed)
Pt presents with generalized abd pain for several weeks, states the pain goes through to her rectum. Pt states has CT scheduled for 5/12 but couldn't wait.

## 2019-05-21 NOTE — ED Provider Notes (Signed)
[facility] EMERGENCY DEPARTMENT Provider Note   CSN: [id number] Arrival date & time: 05/21/19  0540     History Chief Complaint  Patient presents with  . Abdominal Pain    Jamie Lutz is a 24 y.o. female.  Patient with complaint of epigastric left upper quadrant abdominal pain since February.'s gotten worse lately.  She has CT scan of her abdomen scheduled for next week.  Patient has not seen gastroenterology.  Patient said in triage the pain goes to her rectum.  But for me she said it was left upper quadrant pain.  Associated with some nausea some appetite changes.  Patient is on omeprazole has been since February.  Patient seen in the emergency department on April 23 as well.  For same complaint.  Patient has not had a CT scan recently.  No vomiting no blood in her bowel movements.        Past Medical History:  Diagnosis Date  . Anxiety   . Depression   . Gestational diabetes     Patient Active Problem List   Diagnosis Date Noted  . Depression with anxiety 06/05/2016  . History of gestational diabetes 02/25/2016  . Adjustment disorder with anxious mood 11/03/2014  . 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 Chronic abdominal pain 05/03/2014  . Constipation 05/03/2014  . Paresthesia 02/06/2014    Past Surgical History:  Procedure Laterality Date  . PERINEAL LACERATION REPAIR  04/17/2011   Procedure: SUTURE REPAIR PERINEAL LACERATION;  Surgeon: Tilda Burrow, MD;  Location: [facility];  Service: Gynecology;  Laterality: N/A;     OB History    Gravida  4   Para  4   Term  4   Preterm  0   AB  0   Living  4     SAB  0   TAB  0   Ectopic  0   Multiple  0   Live Births  4           Family History  Problem Relation Age of Onset  . Hypothyroidism Mother   . Diabetes Maternal Grandmother   . Cancer Maternal Grandfather        esophagus    Social History   Tobacco Use  . Smoking status: Never Smoker  . Smokeless tobacco: Never  Used  Substance Use Topics  . Alcohol use: No    Alcohol/week: 0.0 standard drinks  . Drug use: No    Home Medications Prior to Admission medications   Medication Sig Start Date End Date Taking? Authorizing Provider  Cholecalciferol (HM VITAMIN D3) 100 MCG (4000 UT) CAPS Take 1 capsule by mouth daily.   Yes [provider]  Homeopathic Products (CALM PLUS) SUBL Place 4 tablets under the tongue daily.   Yes [provider]  omeprazole (PRILOSEC) 40 MG capsule Take 1 capsule (40 mg total) by mouth daily. 03/15/19  Yes Jacquelin Hawking, PA-C  UNABLE TO FIND Take 2 capsules by mouth daily. Med Name: GCEL vitamin supplement  Ingredients: 5-Acetyl L- 5-Acetyl L-Glutathione, Cacao, Cardamom (Fruit), Chromium (Chelate), Cordyceps, Folate/5-MTHF, Glutathione Proprietary Blend, Gluten Free, Green Coffee, Green Tea, L-Glutamine, L-Taurine, Magnesium (Chelate), Manganese (Chelate), Methylcobalamin/Hydroxycobalamin, Milk thistle, Molybdenum (Chelate), N-Acetyl L-Cysteine, Quercetin, R-alpha lipoic acid, Ribose, Selenium (Selenomethionine) 7, Turmeric, Vitamin B2 (Riboflavin 5 Phosphate), vitamin b6 (Pyridoxine HCI), Vitamin Bi (Thiamine HCI), Vitamin C, Vitamin D, Zinc Chelate, Cacao, Cardamom (Fruit), Chromium (Chelate), Cordyceps, Folate/5-MTHF, Glutathione Proprietary Blend, Gluten Free, Green Coffee, Green Tea, L-Glutamine, L-Taurine, Magnesium (  Chelate), Manganese (Chelate), Methylcobalamin/Hydroxycobalamin, Milk thistle, Molybdenum (Chelate), N-Acetyl L-Cysteine, Quercetin, R-alpha lipoic acid, Ribose, Selenium (Selenomethionine) 7, Turmeric, Vitamin B2 (Riboflavin 5 Phosphate), vitamin b6 (Pyridoxine HCI), Vitamin Bi (Thiamine HCI), Vitamin C, Vitamin D, Zinc Chelate   Yes [provider]  LEXAPRO 5 MG tablet Take 5 mg by mouth every morning. 05/19/19   [provider]    Allergies    Patient has no known allergies.  Review of Systems   Review of Systems    Constitutional: Negative for chills and fever.  HENT: Negative for congestion, rhinorrhea and sore throat.   Eyes: Negative for visual disturbance.  Respiratory: Negative for cough and shortness of breath.   Cardiovascular: Negative for chest pain and leg swelling.  Gastrointestinal: Positive for abdominal pain and nausea. Negative for blood in stool, diarrhea and vomiting.  Genitourinary: Negative for dysuria.  Musculoskeletal: Negative for back pain and neck pain.  Skin: Negative for rash.  Neurological: Negative for dizziness, light-headedness and headaches.  Hematological: Does not bruise/bleed easily.  Psychiatric/Behavioral: Negative for confusion.    Physical Exam Updated Vital Signs BP 118/86 (BP Location: Left Arm)   Pulse 97   Temp 98.2 F (36.8 C) (Oral)   Resp 17   Ht 1.626 m (5\' 4" )   Wt 62.6 kg   LMP 05/11/2019 Comment: neg preg  SpO2 100%   BMI 23.69 kg/m   Physical Exam Vitals and nursing note reviewed.  Constitutional:      General: She is not in acute distress.    Appearance: Normal appearance. She is well-developed.  HENT:     Head: Normocephalic and atraumatic.  Eyes:     Extraocular Movements: Extraocular movements intact.     Conjunctiva/sclera: Conjunctivae normal.     Pupils: Pupils are equal, round, and reactive to light.  Cardiovascular:     Rate and Rhythm: Normal rate and regular rhythm.     Heart sounds: No murmur.  Pulmonary:     Effort: Pulmonary effort is normal. No respiratory distress.     Breath sounds: Normal breath sounds.  Abdominal:     Palpations: Abdomen is soft.     Tenderness: There is no abdominal tenderness. There is no guarding.  Musculoskeletal:        General: Normal range of motion.     Cervical back: Neck supple.  Skin:    General: Skin is warm and dry.  Neurological:     General: No focal deficit present.     Mental Status: She is alert and oriented to person, place, and time.     ED Results / Procedures /  Treatments   Labs (all labs ordered are listed, but only abnormal results are displayed) Labs Reviewed  COMPREHENSIVE METABOLIC PANEL - Abnormal; Notable for the following components:      Result Value   Glucose, Bld 104 (*)    All other components within normal limits  URINALYSIS, ROUTINE W REFLEX MICROSCOPIC - Abnormal; Notable for the following components:   Color, Urine STRAW (*)    Specific Gravity, Urine 1.003 (*)    All other components within normal limits  CBC WITH DIFFERENTIAL/PLATELET  LIPASE, BLOOD  HCG, QUANTITATIVE, PREGNANCY    EKG None  Radiology CT Abdomen Pelvis W Contrast  Result Date: 05/21/2019 CLINICAL DATA:  Per ED note. Pt presents with generalized abd pain for several weeks, states the pain goes through to her rectum. EXAM: CT ABDOMEN AND PELVIS WITH CONTRAST TECHNIQUE: Multidetector CT imaging of the abdomen  and pelvis was performed using the standard protocol following bolus administration of intravenous contrast. CONTRAST:  OMNIPAQUE IOHEXOL 300 MG/ML  SOLN COMPARISON:  None. FINDINGS: Lower chest: No acute abnormality. Hepatobiliary: No focal liver abnormality is seen. No gallstones, gallbladder wall thickening, or biliary dilatation. Pancreas: Unremarkable. No pancreatic ductal dilatation or surrounding inflammatory changes. Spleen: Normal in size without focal abnormality. Adrenals/Urinary Tract: Adrenal glands are unremarkable. Kidneys are normal, without renal calculi, focal lesion, or hydronephrosis. Bladder is unremarkable. Stomach/Bowel: Stomach is within normal limits. Appendix appears normal. No evidence of bowel wall thickening, distention, or inflammatory changes. Vascular/Lymphatic: No significant vascular findings are present. No enlarged abdominal or pelvic lymph nodes. Reproductive: Uterus and bilateral adnexa are unremarkable. Other: No abdominal wall hernia or abnormality. No abdominopelvic ascites. Musculoskeletal: No acute or significant  osseous findings. IMPRESSION: No acute process in the abdomen or pelvis. Electronically Signed   By: Emmaline Kluver M.D.   On: 05/21/2019 09:40    Procedures Procedures (including critical care time)  Medications Ordered in ED Medications  0.9 %  sodium chloride infusion ( Intravenous New Bag/Given 05/21/19 0758)  iohexol (OMNIPAQUE) 300 MG/ML solution 100 mL (100 mLs Intravenous Contrast Given 05/21/19 0854)    ED Course  I have reviewed the triage vital signs and the nursing notes.  Pertinent labs & imaging results that were available during my care of the patient were reviewed by me and considered in my medical decision making (see chart for details).    MDM Rules/Calculators/A&P                     Work-up here labs without any significant abnormalities.  No leukocytosis.  CT scan without any acute findings.  Pregnancy test negative.  Lipase normal liver function tests normal.  We will have patient continue on her omeprazole.  We will have patient follow-up with Dr. Dionicia Abler from gastroenterology.  It seems that is time for patient to have an upper endoscopy.  Final Clinical Impression(s) / ED Diagnoses Final diagnoses:  Left upper quadrant abdominal pain    Rx / DC Orders ED Discharge Orders    None       Vanetta Mulders, MD 05/21/19 1036

## 2019-05-21 NOTE — ED Notes (Signed)
md notified of bp, fluids given, pt requests a drink of water, water given, hold dc at this time

## 2019-05-26 ENCOUNTER — Encounter: Payer: Self-pay | Admitting: Student

## 2019-05-26 ENCOUNTER — Encounter: Payer: Self-pay | Admitting: Physician Assistant

## 2019-05-26 ENCOUNTER — Encounter (INDEPENDENT_AMBULATORY_CARE_PROVIDER_SITE_OTHER): Payer: Self-pay | Admitting: Gastroenterology

## 2019-05-26 NOTE — Telephone Encounter (Signed)
error 

## 2019-05-26 NOTE — Progress Notes (Signed)
Message Received: 4 days ago Message Contents  Jacquelin Hawking, PA-C  [place] Salts, LPN  Please cancel abd/pelvis CT that you scheduled for [facility]; she had it done in ER and it was normal. It she is agreeable, enter referral to GI. Thank you.    CT cancelled. Pt notified of CT cancellation and aware of GI referral. LPN placed [place] Financial assistance application for outgoing mail to patient on 05-26-19. Pt understands she must have financial assistance approved in order to have specialist visits covered unless she is self pay. Pt understands GI will contact patient for scheduling.

## 2019-06-01 ENCOUNTER — Ambulatory Visit (HOSPITAL_COMMUNITY): Payer: Self-pay

## 2019-07-28 ENCOUNTER — Ambulatory Visit: Payer: Self-pay | Admitting: Physician Assistant

## 2019-08-09 ENCOUNTER — Ambulatory Visit: Payer: Self-pay | Admitting: Physician Assistant

## 2019-08-09 DIAGNOSIS — R109 Unspecified abdominal pain: Secondary | ICD-10-CM

## 2019-08-09 DIAGNOSIS — F32A Depression, unspecified: Secondary | ICD-10-CM

## 2019-08-09 DIAGNOSIS — F329 Major depressive disorder, single episode, unspecified: Secondary | ICD-10-CM

## 2019-08-09 NOTE — Progress Notes (Signed)
   There were no vitals taken for this visit.   Subjective:    Patient ID: Jamie Lutz, female    DOB: 1995-05-22, 24 y.o.   MRN: [id number]  HPI: Jamie Lutz is a 24 y.o. female presenting on 08/09/2019 for No chief complaint on file.   HPI  This is a telemdeicine appointment through [employer] due to coronavirus pandemic.  I connected with  Jamie Lutz on 08/09/19 by a video enabled telemedicine application and verified that I am speaking with the correct person using two identifiers.   I discussed the limitations of evaluation and management by telemedicine. The patient expressed understanding and agreed to proceed.  p is at home.  Provider is at office.   Pt is 23yo with follow up for abdominal pain.  She is getting counseling at [address] of the [place] in [place].    Anxiety improved.   abd pain has gotten better. She says she sometimes has " trapped air"  She has not been ting her omeprazole.   She filled out [employer] financial assistance but hasn't turned it in yet.   She had CT in the ER (prior to having it done as ordered during office visit) and it was normal.  She got covid vaccination   Relevant past medical, surgical, family and social history reviewed and updated as indicated. Interim medical history since our last visit reviewed. Allergies and medications reviewed and updated.  CURRENT MEDS: Vit d  Review of Systems  Per HPI unless specifically indicated above     Objective:    There were no vitals taken for this visit.  Wt Readings from Last 3 Encounters:  05/21/19 138 lb (62.6 kg)  02/13/19 148 lb (67.1 kg)  03/13/18 190 lb (86.2 kg)    Physical Exam Constitutional:      General: She is not in acute distress. HENT:     Head: Normocephalic and atraumatic.  Pulmonary:     Effort: No respiratory distress.  Neurological:     Mental Status: She is alert and oriented to person, place, and time.  Psychiatric:         Behavior: Behavior normal.           Assessment & Plan:   Encounter Diagnoses  Name Primary?  . Abdominal pain, unspecified abdominal location Yes  . Anxiety and depression         -Discussed with pt that If she is still having abd pain, she should be on the omeprazole.   -She is urged to get [employer] application submitted -encourarged pt to cancel GI appt if she isn't going to go (ie don't be a no-show) -pt to continue with her MH provider -Pt to follow up 6 months.   She is to [employer] office psooner prn

## 2019-08-12 ENCOUNTER — Encounter: Payer: Self-pay | Admitting: Physician Assistant

## 2019-08-31 ENCOUNTER — Ambulatory Visit (INDEPENDENT_AMBULATORY_CARE_PROVIDER_SITE_OTHER): Payer: Self-pay | Admitting: Gastroenterology

## 2019-10-11 ENCOUNTER — Encounter (INDEPENDENT_AMBULATORY_CARE_PROVIDER_SITE_OTHER): Payer: Self-pay

## 2019-10-12 ENCOUNTER — Ambulatory Visit (INDEPENDENT_AMBULATORY_CARE_PROVIDER_SITE_OTHER): Payer: Self-pay | Admitting: Gastroenterology

## 2019-11-03 ENCOUNTER — Telehealth (INDEPENDENT_AMBULATORY_CARE_PROVIDER_SITE_OTHER): Payer: Self-pay

## 2019-11-03 ENCOUNTER — Ambulatory Visit (INDEPENDENT_AMBULATORY_CARE_PROVIDER_SITE_OTHER): Payer: Self-pay | Admitting: Gastroenterology

## 2019-11-03 NOTE — Telephone Encounter (Signed)
noted 

## 2019-11-03 NOTE — Telephone Encounter (Signed)
Per Jamie Lutz patient called the morning of her appointment and rescheduled the appt, She rescheduled to November 8,2021.

## 2019-11-28 ENCOUNTER — Telehealth (INDEPENDENT_AMBULATORY_CARE_PROVIDER_SITE_OTHER): Payer: Self-pay

## 2019-11-28 ENCOUNTER — Ambulatory Visit (INDEPENDENT_AMBULATORY_CARE_PROVIDER_SITE_OTHER): Payer: Self-pay | Admitting: Gastroenterology

## 2019-11-28 NOTE — Telephone Encounter (Signed)
Noted, this is the second time patient has not shown up

## 2019-11-28 NOTE — Telephone Encounter (Signed)
Our policy is 3 no shows then they are discharged.  I will make sure Mitzie will send her a no show letter for documentation for chart for the no show today.

## 2020-03-05 ENCOUNTER — Ambulatory Visit: Payer: Self-pay | Admitting: Physician Assistant

## 2020-03-12 ENCOUNTER — Encounter: Payer: Self-pay | Admitting: Physician Assistant

## 2020-07-08 ENCOUNTER — Ambulatory Visit
Admission: EM | Admit: 2020-07-08 | Discharge: 2020-07-08 | Disposition: A | Discharge status: Home / Self Care | Payer: Self-pay | Attending: Family Medicine | Admitting: Family Medicine

## 2020-07-08 ENCOUNTER — Ambulatory Visit (INDEPENDENT_AMBULATORY_CARE_PROVIDER_SITE_OTHER): Payer: Self-pay

## 2020-07-08 ENCOUNTER — Encounter: Payer: Self-pay | Admitting: Emergency Medicine

## 2020-07-08 ENCOUNTER — Other Ambulatory Visit: Payer: Self-pay

## 2020-07-08 DIAGNOSIS — S63630A Sprain of interphalangeal joint of right index finger, initial encounter: Secondary | ICD-10-CM

## 2020-07-08 NOTE — Discharge Instructions (Addendum)
Take 600 mg of ibuprofen twice daily for at least the next 7 days to decrease the swelling in pain involving your right fifth finger.  Also recommend to continue applying ice today and tomorrow to help with swelling.  Wear splint with all activities involving your hand to prevent reinjury.

## 2020-07-08 NOTE — ED Triage Notes (Signed)
Pain, bruising and swelling to RT fifth finger on Friday from closing a door on it.

## 2020-07-08 NOTE — ED Provider Notes (Signed)
[facility]    CSN: [id number] Arrival date & time: 07/08/20  1433      History   Chief Complaint Chief Complaint  Patient presents with   Finger Injury    HPI Jamie Lutz is a 25 y.o. female.   HPI Patient presents today for evaluation of right 5th finger swelling and pain after shutting door on finger x 3 days ago.  She has iced her finger and taken ibuprofen without relief of pain.  Past Medical History:  Diagnosis Date   Anxiety    Depression    Gestational diabetes     Patient Active Problem List   Diagnosis Date Noted   Depression with anxiety 06/05/2016   History of gestational diabetes 02/25/2016   Adjustment disorder with anxious mood 11/03/2014   Myofascial abdominal wall pain 05/03/2014   Chronic abdominal pain 05/03/2014   Constipation 05/03/2014   Paresthesia 02/06/2014    Past Surgical History:  Procedure Laterality Date   PERINEAL LACERATION REPAIR  04/17/2011   Procedure: SUTURE REPAIR PERINEAL LACERATION;  Surgeon: Tilda Burrow, MD;  Location: [facility];  Service: Gynecology;  Laterality: N/A;    OB History     Gravida  4   Para  4   Term  4   Preterm  0   AB  0   Living  4      SAB  0   IAB  0   Ectopic  0   Multiple  0   Live Births  4            Home Medications    Prior to Admission medications   Medication Sig Start Date End Date Taking? Authorizing Provider  Cholecalciferol (HM VITAMIN D3) 100 MCG (4000 UT) CAPS Take 1 capsule by mouth daily.    [provider]  Homeopathic Products (CALM PLUS) SUBL Place 4 tablets under the tongue daily. Patient not taking: Reported on 08/09/2019    [provider]  LEXAPRO 5 MG tablet Take 5 mg by mouth every morning. Patient not taking: Reported on 08/09/2019 05/19/19   [provider]  omeprazole (PRILOSEC) 40 MG capsule Take 1 capsule (40 mg total) by mouth daily. Patient not taking: Reported on 08/09/2019 03/15/19   Jacquelin Hawking, PA-C  UNABLE TO FIND Take 2 capsules by mouth daily. Med Name: GCEL vitamin supplement  Ingredients: 5-Acetyl L- 5-Acetyl L-Glutathione, Cacao, Cardamom (Fruit), Chromium (Chelate), Cordyceps, Folate/5-MTHF, Glutathione Proprietary Blend, Gluten Free, Green Coffee, Green Tea, L-Glutamine, L-Taurine, Magnesium (Chelate), Manganese (Chelate), Methylcobalamin/Hydroxycobalamin, Milk thistle, Molybdenum (Chelate), N-Acetyl L-Cysteine, Quercetin, R-alpha lipoic acid, Ribose, Selenium (Selenomethionine) 7, Turmeric, Vitamin B2 (Riboflavin 5 Phosphate), vitamin b6 (Pyridoxine HCI), Vitamin Bi (Thiamine HCI), Vitamin C, Vitamin D, Zinc Chelate, Cacao, Cardamom (Fruit), Chromium (Chelate), Cordyceps, Folate/5-MTHF, Glutathione Proprietary Blend, Gluten Free, Green Coffee, Green Tea, L-Glutamine, L-Taurine, Magnesium (Chelate), Manganese (Chelate), Methylcobalamin/Hydroxycobalamin, Milk thistle, Molybdenum (Chelate), N-Acetyl L-Cysteine, Quercetin, R-alpha lipoic acid, Ribose, Selenium (Selenomethionine) 7, Turmeric, Vitamin B2 (Riboflavin 5 Phosphate), vitamin b6 (Pyridoxine HCI), Vitamin Bi (Thiamine HCI), Vitamin C, Vitamin D, Zinc Chelate Patient not taking: Reported on 08/09/2019    [provider]    Family History Family History  Problem Relation Age of Onset   Hypothyroidism Mother    Diabetes Maternal Grandmother    Cancer Maternal Grandfather        esophagus    Social History Social History   Tobacco Use   Smoking status: Never   Smokeless tobacco: Never  Vaping Use   Vaping Use: Never used  Substance Use Topics   Alcohol use: No    Alcohol/week: 0.0 standard drinks   Drug use: No     Allergies   Patient has no known allergies.   Review of Systems Review of Systems Pertinent negatives listed in HPI    Physical Exam Triage Vital Signs ED Triage Vitals  Enc Vitals Group     BP 07/08/20 1455 101/64     Pulse Rate 07/08/20 1455 75     Resp 07/08/20 1455 18      Temp 07/08/20 1455 98.7 F (37.1 C)     Temp Source 07/08/20 1455 Oral     SpO2 07/08/20 1455 98 %     Weight --      Height --      Head Circumference --      Peak Flow --      Pain Score 07/08/20 1454 4     Pain Loc --      Pain Edu? --      Excl. in GC? --    No data found.  Updated Vital Signs BP 101/64 (BP Location: Right Arm)   Pulse 75   Temp 98.7 F (37.1 C) (Oral)   Resp 18   SpO2 98%   Visual Acuity Right Eye Distance:   Left Eye Distance:   Bilateral Distance:    Right Eye Near:   Left Eye Near:    Bilateral Near:     Physical Exam General appearance: alert, well developed, well nourished, cooperative  Head: Normocephalic, without obvious abnormality, atraumatic Respiratory: Respirations even and unlabored, normal respiratory rate Heart: rate and rhythm normal. No gallop or murmurs noted on exam  Abdomen: BS +, no distention, no rebound tenderness, or no mass Extremities: No gross deformities Skin: Skin color, texture, turgor normal. No rashes seen  Psych: Appropriate mood and affect. Neurologic: GCS 15, normal coordination, normal gait  UC Treatments / Results  Labs (all labs ordered are listed, but only abnormal results are displayed) Labs Reviewed - No data to display  EKG   Radiology No results found.  Procedures Procedures (including critical care time)  Medications Ordered in UC Medications - No data to display  Initial Impression / Assessment and Plan / UC Course  I have reviewed the triage vital signs and the nursing notes.  Pertinent labs & imaging results that were available during my care of the patient were reviewed by me and considered in my medical decision making (see chart for details).    Right finger sprain. Imaging negative for fracture or avulsion. Conservative management warranted only. Finger splinted for comfort.  She will continue ibuprofen twice daily for 7 days. Information provided to follow-up with Ortho  if symptoms worsen or do not improve Final Clinical Impressions(s) / UC Diagnoses   Final diagnoses:  Sprain of interphalangeal joint of right index finger, initial encounter     Discharge Instructions      Take 600 mg of ibuprofen twice daily for at least the next 7 days to decrease the swelling in pain involving your right fifth finger.  Also recommend to continue applying ice today and tomorrow to help with swelling.  Wear splint with all activities involving your hand to prevent reinjury.    ED Prescriptions   None    PDMP not reviewed this encounter.   Bing Neighbors, FNP 07/11/20 1217

## 2021-01-29 IMAGING — CT CT ANGIO CHEST
2 of 6 series · 18 of 46 positions shown · IV contrast (omnipaque)
Comparison: None.

CLINICAL DATA: Shortness of breath, LEFT-sided chest pain, LEFT
lower leg pain for 2 weeks.

EXAM:
CT ANGIOGRAPHY CHEST WITH CONTRAST
TECHNIQUE: Multidetector CT imaging of the chest was performed using the
standard protocol during bolus administration of intravenous
contrast. Multiplanar CT image reconstructions and MIPs were
obtained to evaluate the vascular anatomy.
CONTRAST:  100mL OMNIPAQUE IOHEXOL 350 MG/ML SOLN

[Series 5: pe axial thins · axial · 0.80mm/px · z∈[+1179,+1419]mm · 15 of 264 slices shown]
[im 12/264  lung]
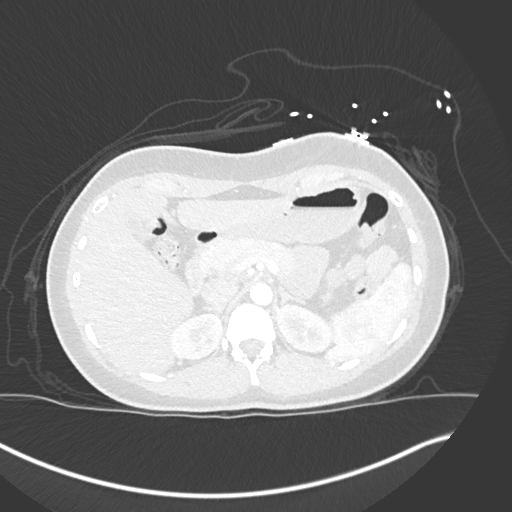
[im 35/264  soft-tissue]
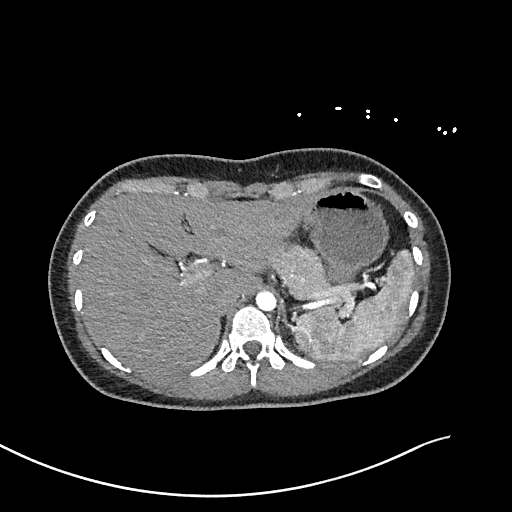
[im 46/264  lung]
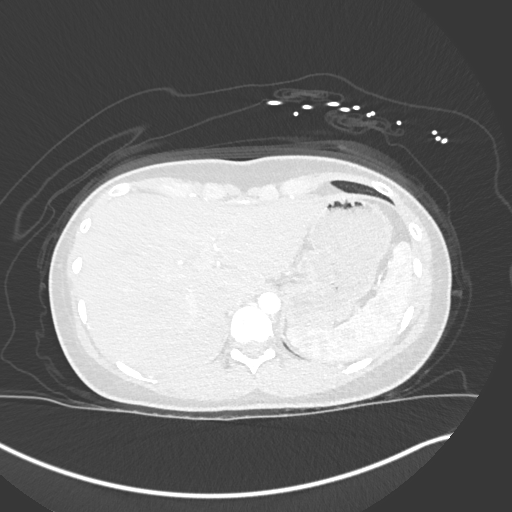
[im 69/264  soft-tissue]
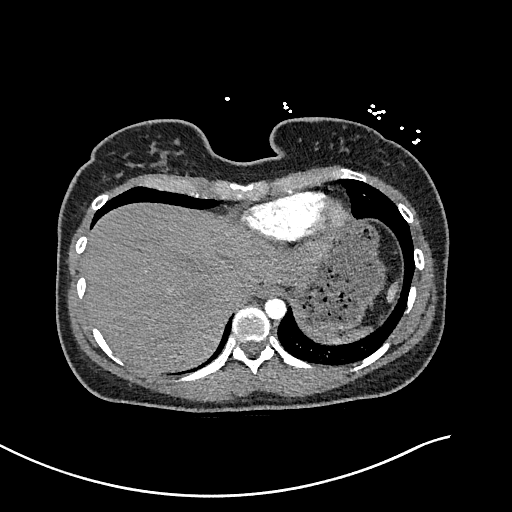
[im 81/264  lung]
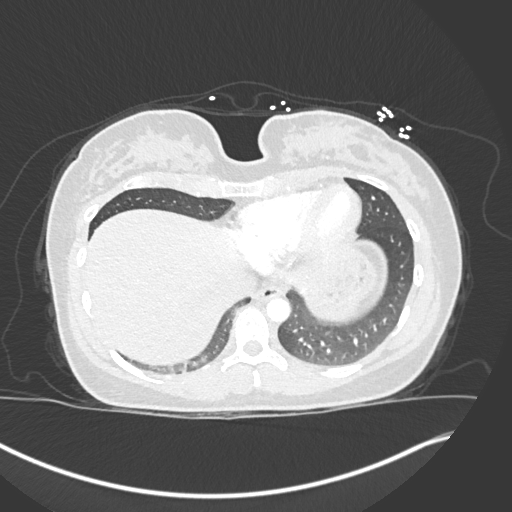
[im 103/264  soft-tissue]
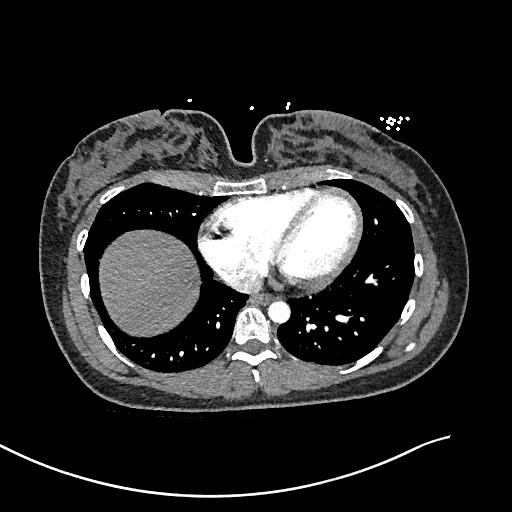
[im 115/264  lung]
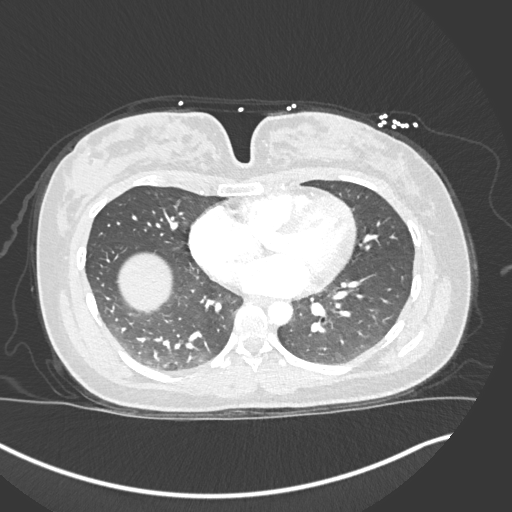
[im 138/264  soft-tissue]
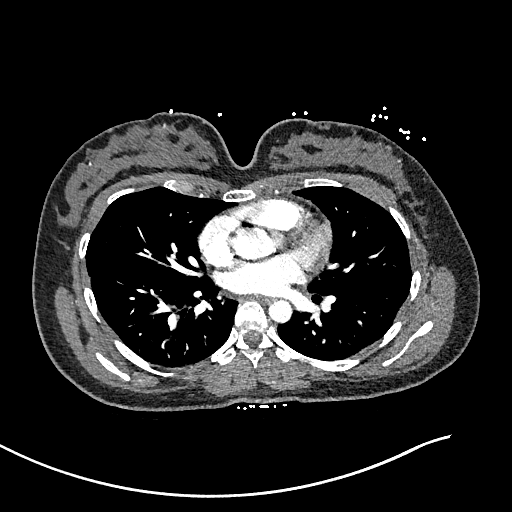
[im 149/264  lung]
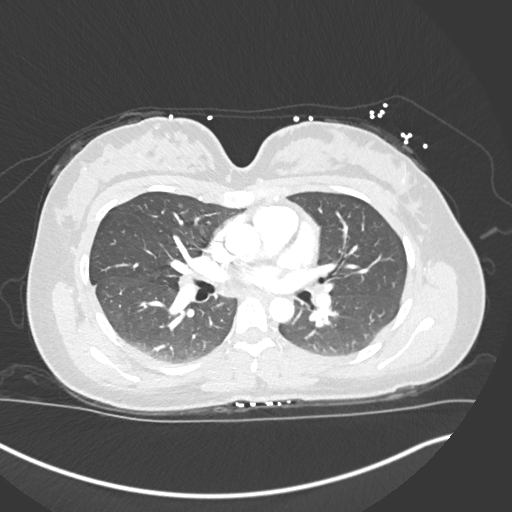
[im 161/264  soft-tissue]
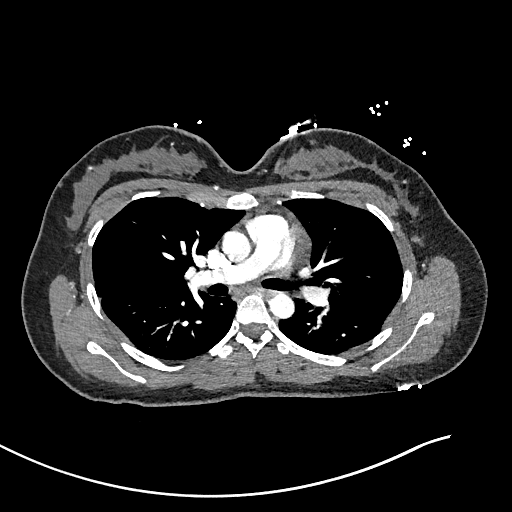
[im 183/264  lung]
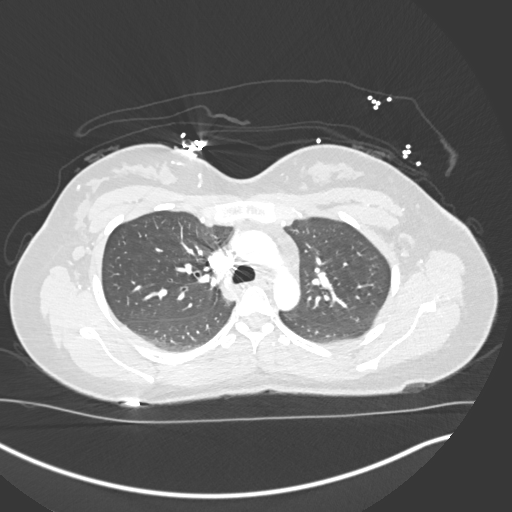
[im 195/264  soft-tissue]
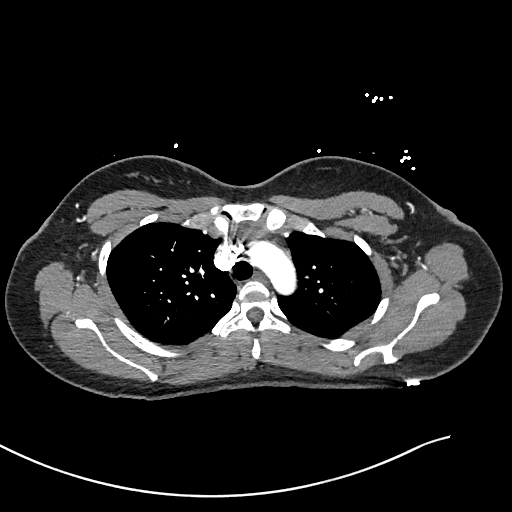
[im 218/264  lung]
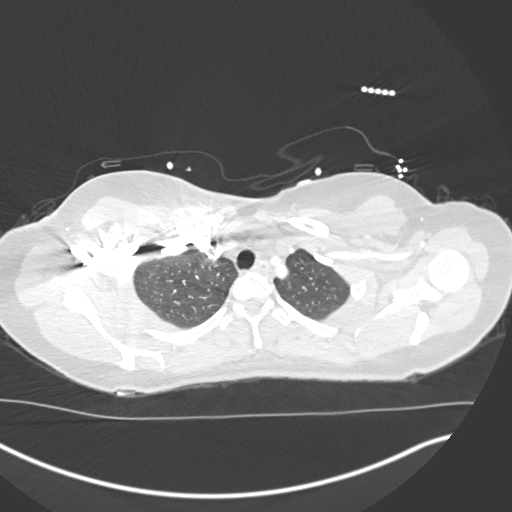
[im 229/264  soft-tissue]
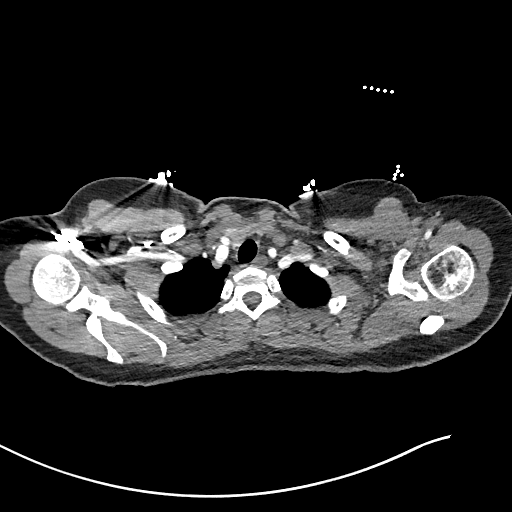
[im 252/264  lung]
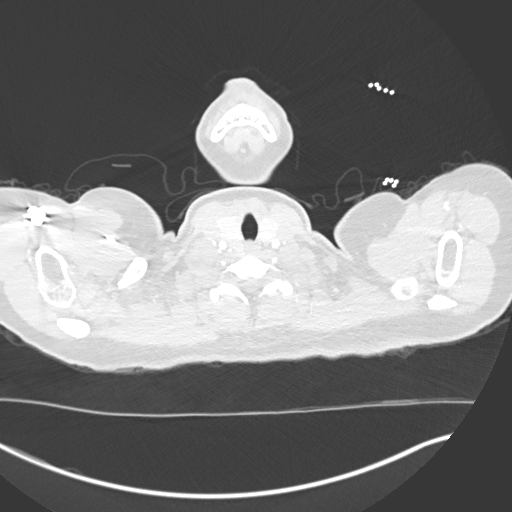

[Series 7: cor soft · coronal · 0.57mm/px · 3 of 117 slices shown]
[im 30/117  soft-tissue]
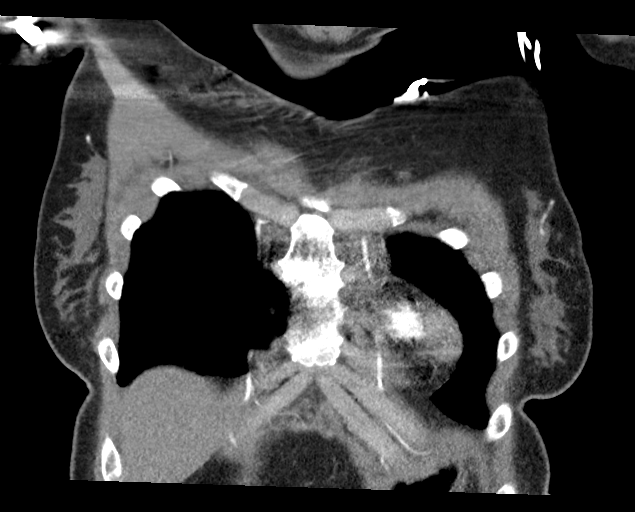
[im 59/117  soft-tissue]
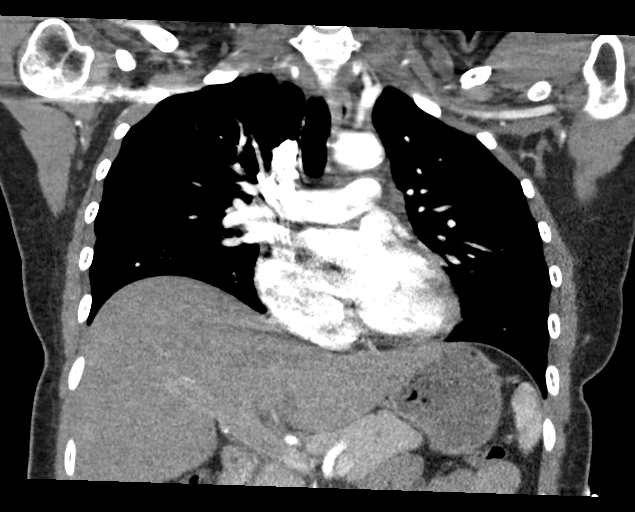
[im 88/117  soft-tissue]
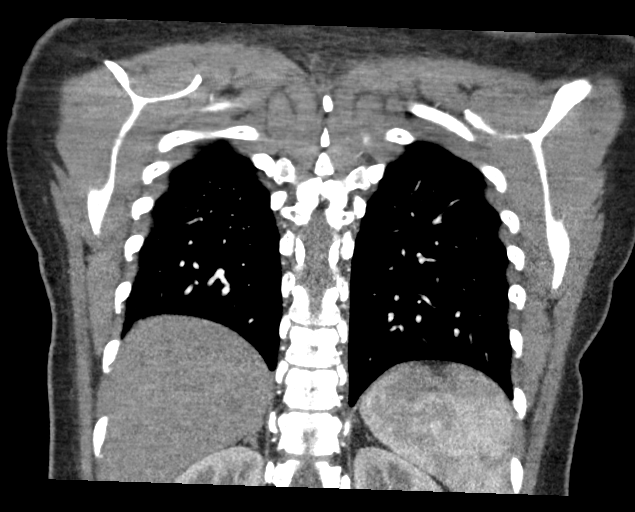

[18 of 46 positions shown; findings below may reference images not displayed]

FINDINGS: Cardiovascular: There is no pulmonary embolism identified within the
main, lobar or segmental pulmonary arteries bilaterally.

Heart size is normal. No pericardial effusion. No thoracic aortic
aneurysm or evidence of aortic dissection.

Mediastinum/Nodes: No mass or enlarged lymph nodes seen within the
mediastinum or perihilar regions. Esophagus is unremarkable. Trachea
and central bronchi are unremarkable.

Lungs/Pleura: Lungs are clear. No pleural effusion or pneumothorax.

Upper Abdomen: Limited images of the upper abdomen are unremarkable.

Musculoskeletal: No acute or suspicious osseous finding.

Review of the MIP images confirms the above findings.
IMPRESSION: Normal exam. No pulmonary embolism.

## 2021-02-02 IMAGING — US US EXTREM LOW VENOUS*L*
1 series · 13 of 24 positions shown · non-contrast
Comparison: None.

CLINICAL DATA: Lower extremity pain and edema

EXAM:
LEFT LOWER EXTREMITY VENOUS DUPLEX ULTRASOUND
TECHNIQUE: Gray-scale sonography with graded compression, as well as color
Doppler and duplex ultrasound were performed to evaluate the left
lower extremity deep venous system from the level of the common
femoral vein and including the common femoral, femoral, profunda
femoral, popliteal and calf veins including the posterior tibial,
peroneal and gastrocnemius veins when visible. The superficial great
saphenous vein was also interrogated. Spectral Doppler was utilized
to evaluate flow at rest and with distal augmentation maneuvers in
the common femoral, femoral and popliteal veins.

[Series 1: us extrem low venous*left* · 13 of 38 slices shown]
[im 1/38]
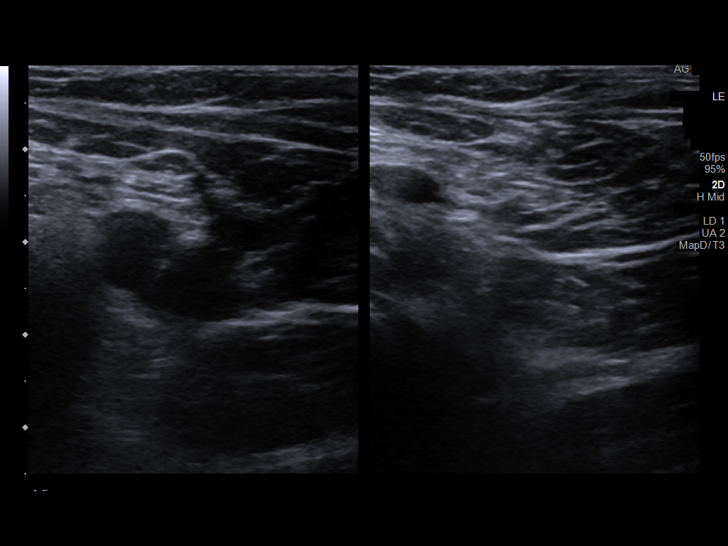
[im 4/38]
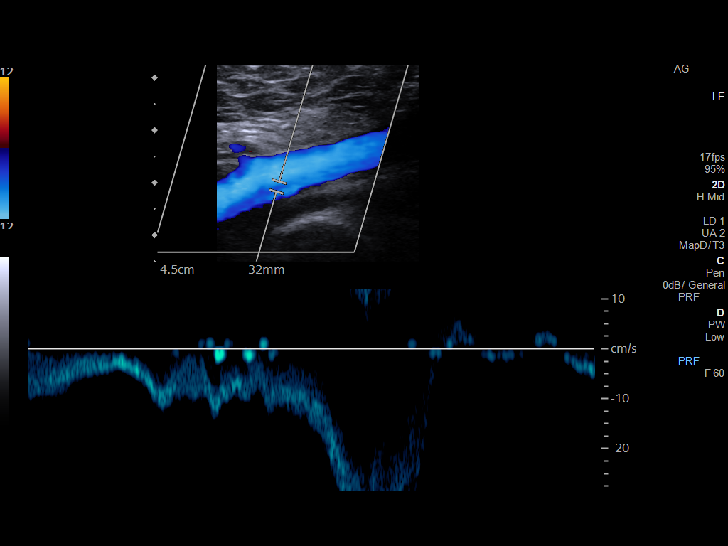
[im 7/38]
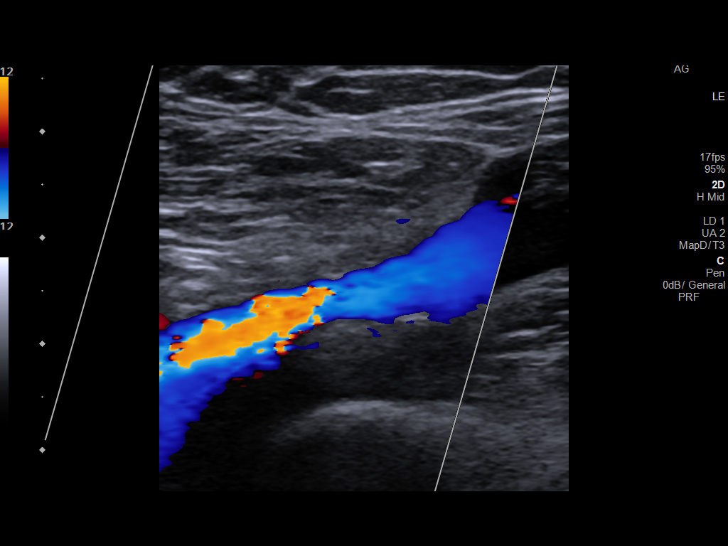
[im 10/38]
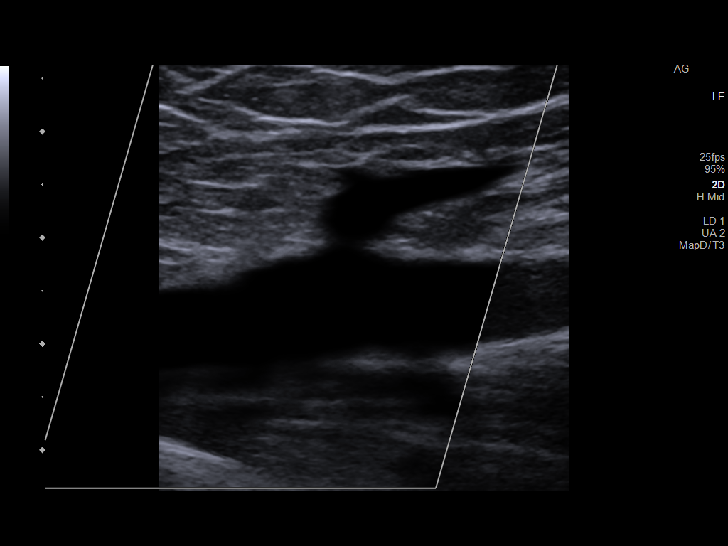
[im 13/38]
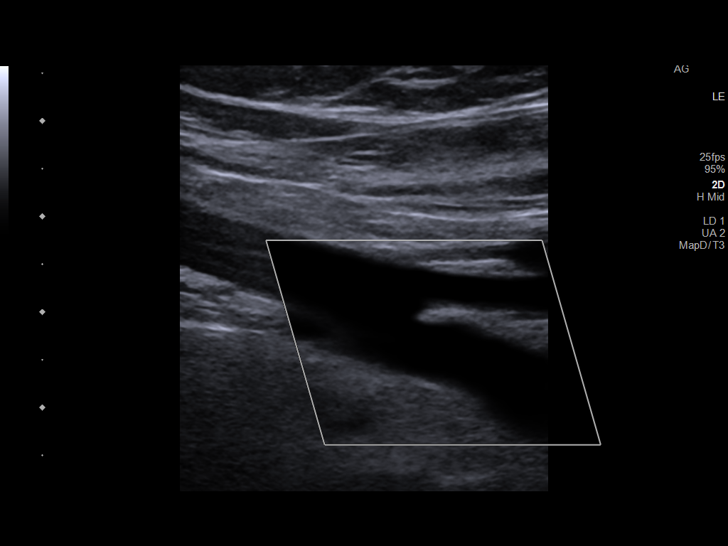
[im 17/38]
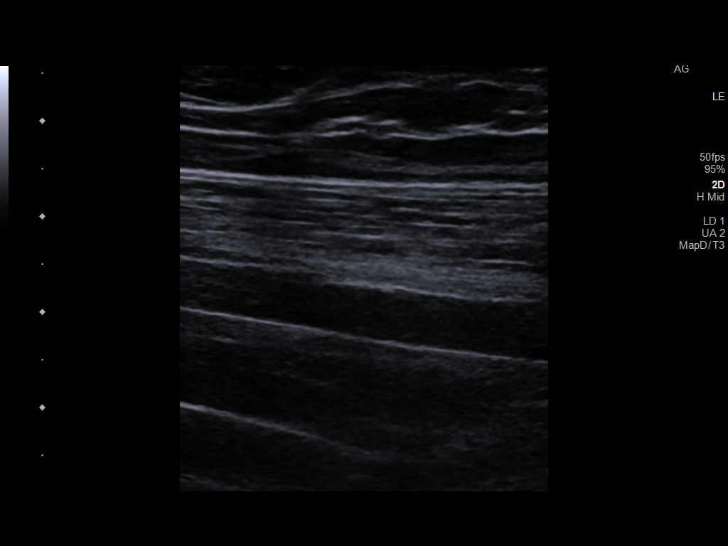
[im 20/38]
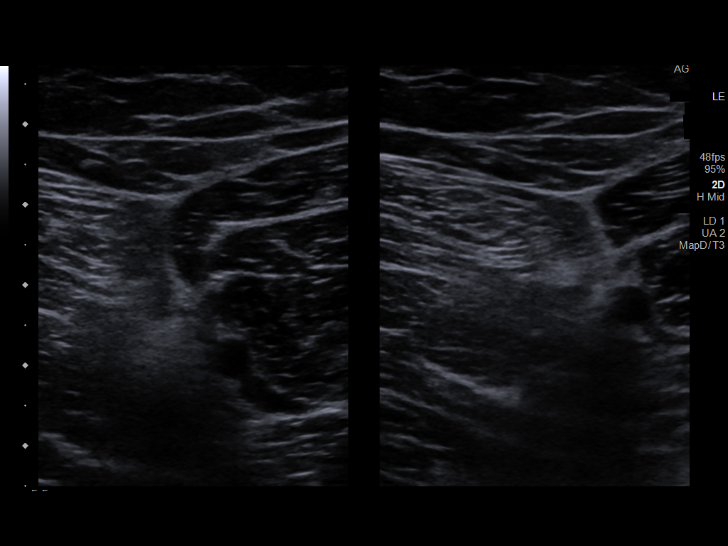
[im 21/38]
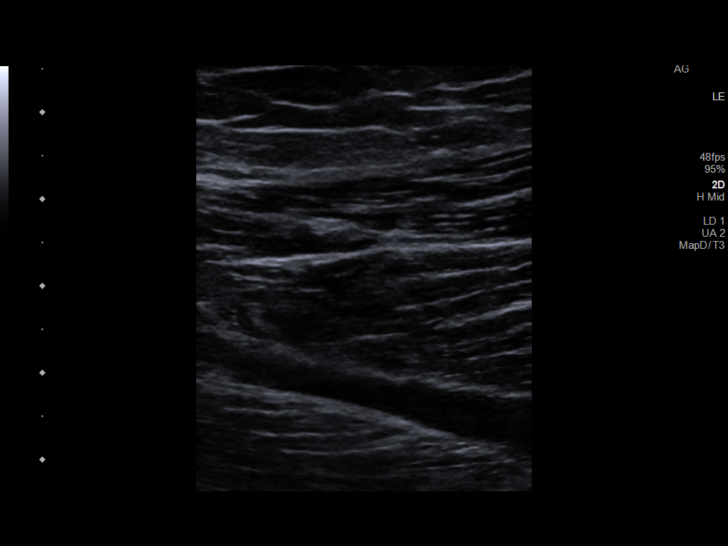
[im 25/38]
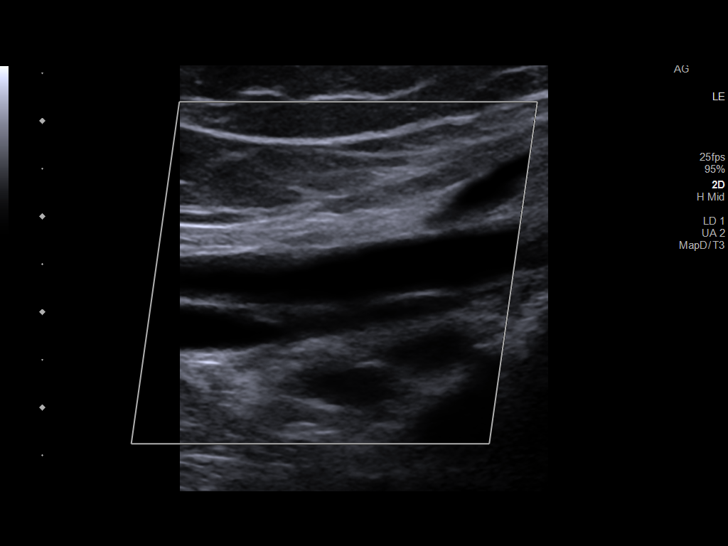
[im 28/38]
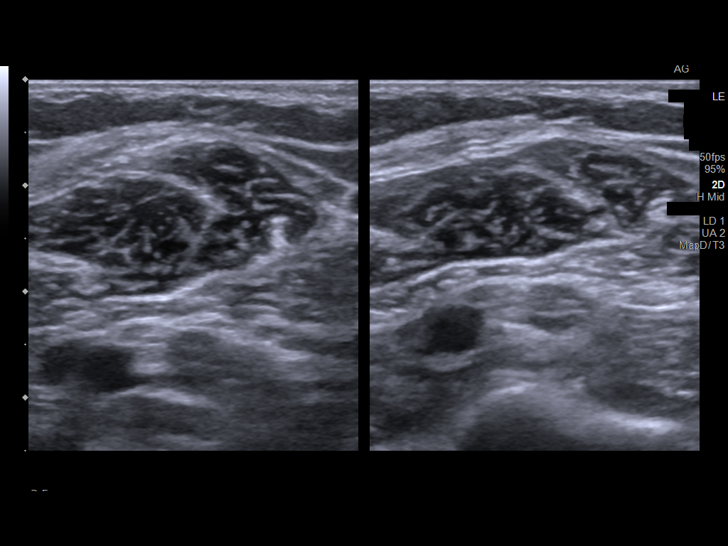
[im 31/38]
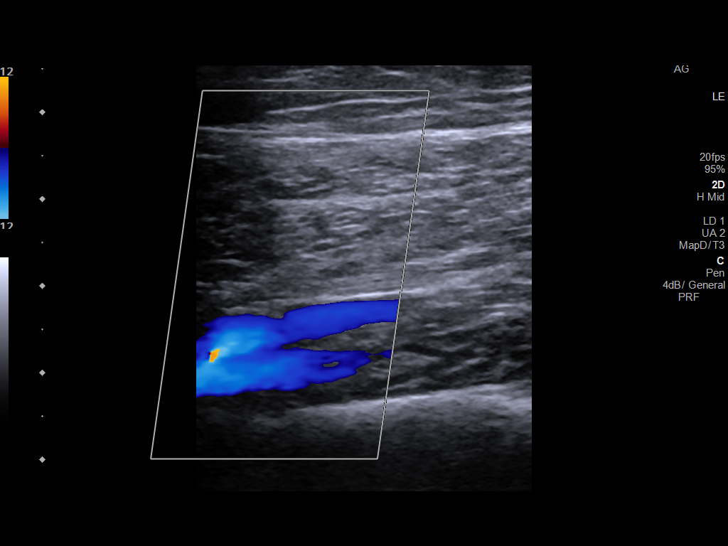
[im 34/38]
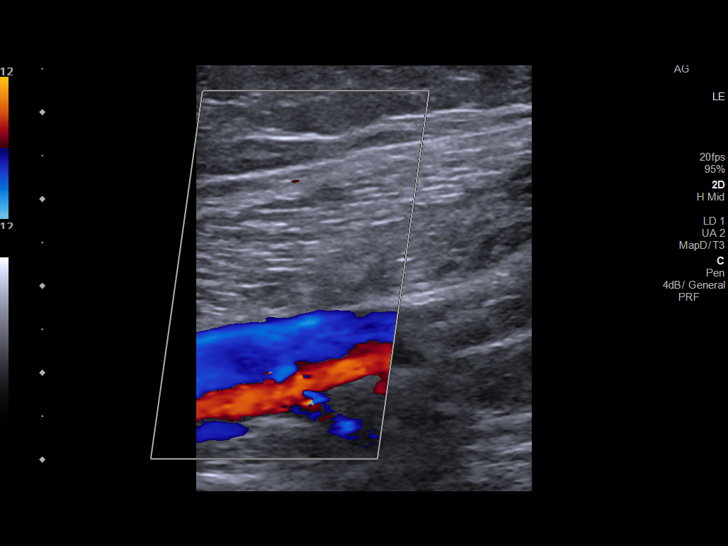
[im 38/38]
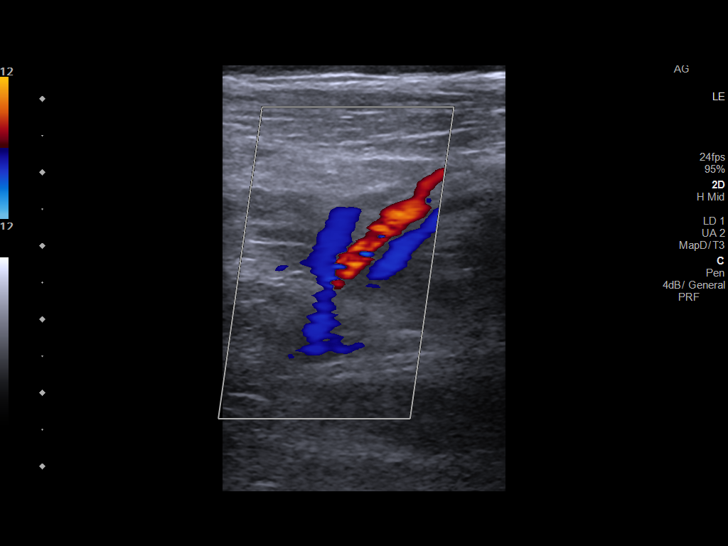

[13 of 24 positions shown; findings below may reference images not displayed]

FINDINGS: Contralateral Common Femoral Vein: Respiratory phasicity is normal
and symmetric with the symptomatic side. No evidence of thrombus.
Normal compressibility.

Common Femoral Vein: No evidence of thrombus. Normal
compressibility, respiratory phasicity and response to augmentation.

Saphenofemoral Junction: No evidence of thrombus. Normal
compressibility and flow on color Doppler imaging.

Profunda Femoral Vein: No evidence of thrombus. Normal
compressibility and flow on color Doppler imaging.

Femoral Vein: No evidence of thrombus. Normal compressibility,
respiratory phasicity and response to augmentation.

Popliteal Vein: No evidence of thrombus. Normal compressibility,
respiratory phasicity and response to augmentation.

Calf Veins: No evidence of thrombus. Normal compressibility and flow
on color Doppler imaging.

Superficial Great Saphenous Vein: No evidence of thrombus. Normal
compressibility.

Venous Reflux:  None.

Other Findings:  None.
IMPRESSION: No evidence of deep venous thrombosis in the left lower extremity.
Right common femoral vein also patent.

## 2021-07-05 ENCOUNTER — Other Ambulatory Visit: Payer: Self-pay | Admitting: *Deleted

## 2021-07-05 DIAGNOSIS — O3680X0 Pregnancy with inconclusive fetal viability, not applicable or unspecified: Secondary | ICD-10-CM

## 2021-07-08 ENCOUNTER — Encounter (HOSPITAL_BASED_OUTPATIENT_CLINIC_OR_DEPARTMENT_OTHER): Payer: Self-pay

## 2021-07-08 ENCOUNTER — Other Ambulatory Visit: Payer: Self-pay | Admitting: *Deleted

## 2021-07-08 ENCOUNTER — Ambulatory Visit (HOSPITAL_BASED_OUTPATIENT_CLINIC_OR_DEPARTMENT_OTHER)
Admission: RE | Admit: 2021-07-08 | Discharge: 2021-07-08 | Disposition: A | Discharge status: Home / Self Care | Payer: Self-pay | Source: Ambulatory Visit | Attending: Obstetrics & Gynecology | Admitting: Obstetrics & Gynecology

## 2021-07-08 DIAGNOSIS — O3680X0 Pregnancy with inconclusive fetal viability, not applicable or unspecified: Secondary | ICD-10-CM

## 2021-07-24 ENCOUNTER — Encounter: Payer: Self-pay | Admitting: *Deleted

## 2021-07-24 DIAGNOSIS — Z3490 Encounter for supervision of normal pregnancy, unspecified, unspecified trimester: Secondary | ICD-10-CM | POA: Insufficient documentation

## 2021-07-24 DIAGNOSIS — O0990 Supervision of high risk pregnancy, unspecified, unspecified trimester: Secondary | ICD-10-CM | POA: Insufficient documentation

## 2021-07-25 ENCOUNTER — Encounter: Payer: Self-pay | Admitting: Advanced Practice Midwife

## 2021-07-25 ENCOUNTER — Ambulatory Visit: Payer: Self-pay | Admitting: *Deleted

## 2021-08-12 ENCOUNTER — Other Ambulatory Visit: Payer: Self-pay | Admitting: Obstetrics & Gynecology

## 2021-08-12 DIAGNOSIS — Z363 Encounter for antenatal screening for malformations: Secondary | ICD-10-CM

## 2021-08-13 ENCOUNTER — Encounter: Payer: Self-pay | Admitting: Women's Health

## 2021-08-13 ENCOUNTER — Ambulatory Visit (INDEPENDENT_AMBULATORY_CARE_PROVIDER_SITE_OTHER): Payer: Self-pay | Admitting: Women's Health

## 2021-08-13 ENCOUNTER — Other Ambulatory Visit (HOSPITAL_COMMUNITY)
Admission: RE | Admit: 2021-08-13 | Discharge: 2021-08-13 | Disposition: A | Discharge status: Home / Self Care | Payer: Self-pay | Source: Ambulatory Visit | Attending: Women's Health | Admitting: Women's Health

## 2021-08-13 ENCOUNTER — Ambulatory Visit (INDEPENDENT_AMBULATORY_CARE_PROVIDER_SITE_OTHER): Payer: Self-pay

## 2021-08-13 ENCOUNTER — Ambulatory Visit: Payer: Self-pay | Admitting: *Deleted

## 2021-08-13 VITALS — BP 106/70 | HR 79 | Wt 172.0 lb

## 2021-08-13 DIAGNOSIS — Z31430 Encounter of female for testing for genetic disease carrier status for procreative management: Secondary | ICD-10-CM

## 2021-08-13 DIAGNOSIS — Z3482 Encounter for supervision of other normal pregnancy, second trimester: Secondary | ICD-10-CM

## 2021-08-13 DIAGNOSIS — Z8632 Personal history of gestational diabetes: Secondary | ICD-10-CM

## 2021-08-13 DIAGNOSIS — Z3480 Encounter for supervision of other normal pregnancy, unspecified trimester: Secondary | ICD-10-CM

## 2021-08-13 DIAGNOSIS — Z363 Encounter for antenatal screening for malformations: Secondary | ICD-10-CM

## 2021-08-13 DIAGNOSIS — R8271 Bacteriuria: Secondary | ICD-10-CM

## 2021-08-13 DIAGNOSIS — Z3A20 20 weeks gestation of pregnancy: Secondary | ICD-10-CM

## 2021-08-13 DIAGNOSIS — O99891 Other specified diseases and conditions complicating pregnancy: Secondary | ICD-10-CM

## 2021-08-13 NOTE — Progress Notes (Signed)
[place] 20+2 wks,breech,anterior placenta gr 0,normal ovaries,CX 4.3 cm,SVP of fluid 5.1 cm,EFW 351 g,anatomy complete,no obvious abnormalities,EDD 12/29/2021 by today's ultrasound

## 2021-08-13 NOTE — Patient Instructions (Signed)
Byrd Hesselbach, thank you for choosing our office today! We appreciate the opportunity to meet your healthcare needs. You may receive a short survey by mail, e-mail, or through [employer]. If you are happy with your care we would appreciate if you could take just a few minutes to complete the survey questions. We read all of your comments and take your feedback very seriously. Thank you again for choosing our office.  Center for [employer] [facility] Team at [facility] at [facility] ([address] [place], [place] [address]) Entrance C, located off of [address] Free 24/7 valet parking   For Headaches:  Stay well hydrated, drink enough water so that your urine is clear, sometimes if you are dehydrated you can get headaches Eat small frequent meals and snacks, sometimes if you are hungry you can get headaches Sometimes you get headaches during pregnancy from the pregnancy hormones You can try tylenol (1-2 regular strength 325mg  or 1-2 extra strength 500mg ) as directed on the box. The least amount of medication that works is best.  Cool compresses (cool wet washcloth or ice pack) to area of head that is hurting You can also try drinking a caffeinated drink to see if this will help If not helping, try below:  For Prevention of Headaches/Migraines: CoQ10 100mg  three times daily Vitamin B2 400mg  daily Magnesium Oxide 400-600mg  daily  Foods to alleviate migraines:  1) dark leafy greens 2) avocado 3) tuna 4) salmon  5) beans and legumes  Foods to avoid: 1) Excessive (or irregular timing) coffee 3) aged cheeses 4) chocolate 5) citrus fruits 6) aspartame and other artifical sweeteners 7) yeast 8) MSG (in processed foods) 9) processed and cured meats 10) nuts and certain seeds 11) chicken livers and other organ meats 12) dairy products like buttermilk, sour cream, and yogurt 13) dried fruits like dates, figs, and raisins 14) garlic 15) onions 16) potato chips 17)  pickled foods like olives and sauerkraut 18) some fresh fruits like ripe banana, papaya, red plums, raspberries, kiwi, pineapple 19) tomato-based products  Recommend to keep a migraine diary: rate daily the severity of your headache (1-10) and what foods you eat that day to help determine patterns.   If You Get a Bad Headache/Migraine: Benadryl 25mg   Magnesium Oxide 1 large Gatorade 2 extra strength Tylenol (1,000mg  total) 1 cup coffee or Coke      If this doesn't help please call @ [phone]  Go to [email or web address] to register for FREE online childbirth classes  Call the office [phone]) or go to [facility] if: You begin to severe cramping Your water breaks.  Sometimes it is a big gush of fluid, sometimes it is just a trickle that keeps getting your panties wet or running down your legs You have vaginal bleeding.  It is normal to have a small amount of spotting if your cervix was checked.   [facility] Pediatricians/Family Doctors [facility]): [address] [phone], [phone]           [facility]: [address], [phone]                [facility]): [address], [phone] (call to ask if accepting patients) [facility] Department: [address], [place], [phone]    [facility] Pediatricians/Family Doctors [facility]): [phone], [phone] [facility]: [address] [place], [address] [facility]:  [place], [facility]): [phone] [facility]: [address] [phone]   [place]: [address] [facility]: [phone], [phone]  Home Blood Pressure Monitoring for Patients   Your  provider has recommended that you check your blood pressure (BP) at least once a week at home. If you do not have a blood pressure cuff at home, one will be provided for you. Contact your provider if you have not received your monitor within 1 week.   Helpful Tips for Accurate Home Blood Pressure Checks  Don't smoke, exercise, or drink caffeine 30 minutes before checking your BP Use the restroom before checking your BP (a full bladder can raise your pressure) Relax in a comfortable upright chair Feet on the ground Left arm resting comfortably on a flat surface at the level of your heart Legs uncrossed Back supported Sit quietly and don't talk Place the cuff on your bare arm Adjust snuggly, so that only two fingertips can fit between your skin and the top of the cuff Check 2 readings separated by at least one minute Keep a log of your BP readings For a visual, please reference this diagram: http://ccnc.care/bpdiagram  Provider Name: [facility]     Phone: [phone]  Zone 1: ALL CLEAR  Continue to monitor your symptoms:  BP reading is less than 140 (top number) or less than 90 (bottom number)  No right upper stomach pain No headaches or seeing spots No feeling nauseated or throwing up No swelling in face and hands  Zone 2: CAUTION Call your doctor's office for any of the following:  BP reading is greater than 140 (top number) or greater than 90 (bottom number)  Stomach pain under your ribs in the middle or right side Headaches or seeing spots Feeling nauseated or throwing up Swelling in face and hands  Zone 3: EMERGENCY  Seek immediate medical care if you have any of the following:  BP reading is greater than160 (top number) or greater than 110 (bottom number) Severe headaches not improving with Tylenol Serious difficulty catching your breath Any worsening symptoms from Zone 2     Second Trimester of Pregnancy The second trimester is from week 14 through week 27  (months 4 through 6). The second trimester is often a time when you feel your best. Your body has adjusted to being pregnant, and you begin to feel better physically. Usually, morning sickness has lessened or quit completely, you may have more energy, and you may have an increase in appetite. The second trimester is also a time when the fetus is growing rapidly. At the end of the sixth month, the fetus is about 9 inches long and weighs about 1 pounds. You will likely begin to feel the baby move (quickening) between 16 and 20 weeks of pregnancy. Body changes during your second trimester Your body continues to go through many changes during your second trimester. The changes vary from woman to woman. Your weight will continue to increase. You will notice your lower abdomen bulging out. You may begin to get stretch marks on your hips, abdomen, and breasts. You may develop headaches that can be relieved by medicines. The medicines should be approved by your health care provider. You may urinate more often because the fetus is pressing on your bladder. You may develop or continue  to have heartburn as a result of your pregnancy. You may develop constipation because certain hormones are causing the muscles that push waste through your intestines to slow down. You may develop hemorrhoids or swollen, bulging veins (varicose veins). You may have back pain. This is caused by: Weight gain. Pregnancy hormones that are relaxing the joints in your pelvis. A shift in weight and the muscles that support your balance. Your breasts will continue to grow and they will continue to become tender. Your gums may bleed and may be sensitive to brushing and flossing. Dark spots or blotches (chloasma, mask of pregnancy) may develop on your face. This will likely fade after the baby is born. A dark line from your belly button to the pubic area (linea nigra) may appear. This will likely fade after the baby is born. You may  have changes in your hair. These can include thickening of your hair, rapid growth, and changes in texture. Some women also have hair loss during or after pregnancy, or hair that feels dry or thin. Your hair will most likely return to normal after your baby is born.  What to expect at prenatal visits During a routine prenatal visit: You will be weighed to make sure you and the fetus are growing normally. Your blood pressure will be taken. Your abdomen will be measured to track your baby's growth. The fetal heartbeat will be listened to. Any test results from the previous visit will be discussed.  Your health care provider may ask you: How you are feeling. If you are feeling the baby move. If you have had any abnormal symptoms, such as leaking fluid, bleeding, severe headaches, or abdominal cramping. If you are using any tobacco products, including cigarettes, chewing tobacco, and electronic cigarettes. If you have any questions.  Other tests that may be performed during your second trimester include: Blood tests that check for: Low iron levels (anemia). High blood sugar that affects pregnant women (gestational diabetes) between 53 and 28 weeks. Rh antibodies. This is to check for a protein on red blood cells (Rh factor). Urine tests to check for infections, diabetes, or protein in the urine. An ultrasound to confirm the proper growth and development of the baby. An amniocentesis to check for possible genetic problems. Fetal screens for spina bifida and Down syndrome. HIV (human immunodeficiency virus) testing. Routine prenatal testing includes screening for HIV, unless you choose not to have this test.  Follow these instructions at home: Medicines Follow your health care provider's instructions regarding medicine use. Specific medicines may be either safe or unsafe to take during pregnancy. Take a prenatal vitamin that contains at least 600 micrograms (mcg) of folic acid. If you  develop constipation, try taking a stool softener if your health care provider approves. Eating and drinking Eat a balanced diet that includes fresh fruits and vegetables, whole grains, good sources of protein such as meat, eggs, or tofu, and low-fat dairy. Your health care provider will help you determine the amount of weight gain that is right for you. Avoid raw meat and uncooked cheese. These carry germs that can cause birth defects in the baby. If you have low calcium intake from food, talk to your health care provider about whether you should take a daily calcium supplement. Limit foods that are high in fat and processed sugars, such as fried and sweet foods. To prevent constipation: Drink enough fluid to keep your urine clear or pale yellow. Eat foods that are high in fiber, such as  fresh fruits and vegetables, whole grains, and beans. Activity Exercise only as directed by your health care provider. Most women can continue their usual exercise routine during pregnancy. Try to exercise for 30 minutes at least 5 days a week. Stop exercising if you experience uterine contractions. Avoid heavy lifting, wear low heel shoes, and practice good posture. A sexual relationship may be continued unless your health care provider directs you otherwise. Relieving pain and discomfort Wear a good support bra to prevent discomfort from breast tenderness. Take warm sitz baths to soothe any pain or discomfort caused by hemorrhoids. Use hemorrhoid cream if your health care provider approves. Rest with your legs elevated if you have leg cramps or low back pain. If you develop varicose veins, wear support hose. Elevate your feet for 15 minutes, 3-4 times a day. Limit salt in your diet. Prenatal Care Write down your questions. Take them to your prenatal visits. Keep all your prenatal visits as told by your health care provider. This is important. Safety Wear your seat belt at all times when driving. Make a list  of emergency phone numbers, including numbers for family, friends, the hospital, and police and fire departments. General instructions Ask your health care provider for a referral to a local prenatal education class. Begin classes no later than the beginning of month 6 of your pregnancy. Ask for help if you have counseling or nutritional needs during pregnancy. Your health care provider can offer advice or refer you to specialists for help with various needs. Do not use hot tubs, steam rooms, or saunas. Do not douche or use tampons or scented sanitary pads. Do not cross your legs for long periods of time. Avoid cat litter boxes and soil used by cats. These carry germs that can cause birth defects in the baby and possibly loss of the fetus by miscarriage or stillbirth. Avoid all smoking, herbs, alcohol, and unprescribed drugs. Chemicals in these products can affect the formation and growth of the baby. Do not use any products that contain nicotine or tobacco, such as cigarettes and e-cigarettes. If you need help quitting, ask your health care provider. Visit your dentist if you have not gone yet during your pregnancy. Use a soft toothbrush to brush your teeth and be gentle when you floss. Contact a health care provider if: You have dizziness. You have mild pelvic cramps, pelvic pressure, or nagging pain in the abdominal area. You have persistent nausea, vomiting, or diarrhea. You have a bad smelling vaginal discharge. You have pain when you urinate. Get help right away if: You have a fever. You are leaking fluid from your vagina. You have spotting or bleeding from your vagina. You have severe abdominal cramping or pain. You have rapid weight gain or weight loss. You have shortness of breath with chest pain. You notice sudden or extreme swelling of your face, hands, ankles, feet, or legs. You have not felt your baby move in over an hour. You have severe headaches that do not go away when you  take medicine. You have vision changes. Summary The second trimester is from week 14 through week 27 (months 4 through 6). It is also a time when the fetus is growing rapidly. Your body goes through many changes during pregnancy. The changes vary from woman to woman. Avoid all smoking, herbs, alcohol, and unprescribed drugs. These chemicals affect the formation and growth your baby. Do not use any tobacco products, such as cigarettes, chewing tobacco, and e-cigarettes. If you need help  quitting, ask your health care provider. Contact your health care provider if you have any questions. Keep all prenatal visits as told by your health care provider. This is important. This information is not intended to replace advice given to you by your health care provider. Make sure you discuss any questions you have with your health care provider. Document Released: 12/31/2000 Document Revised: 06/14/2015 Document Reviewed: 03/09/2012 Elsevier Interactive Patient Education  2017 [employer].

## 2021-08-13 NOTE — Progress Notes (Signed)
INITIAL OBSTETRICAL VISIT Patient name: Jamie Lutz MRN [id number]  Date of birth: February 27, 1995 Chief Complaint:   Initial Prenatal Visit  History of Present Illness:   Jamie Lutz is a 26 y.o. [id number] Hispanic female at [redacted]w[redacted]d by  today's u/s (had earlier u/s at hospital but only measurement was BPD),  with an Estimated Date of Delivery: 12/29/21 being seen today for her initial obstetrical visit.   Patient's last menstrual period was 04/22/2021 (within months). Her obstetrical history is significant for  term SVB x 4, GDM x 1 .   Today she reports headaches.  Dep/anx, no meds, doing well, PHQ9=5, GAD7=5 (not entered yet) Last pap 2017. Results were: NILM w/ HRHPV not done     08/13/2021   10:22 AM 10/30/2017   11:53 AM 11/15/2015    2:56 PM  Depression screen PHQ 2/9  Decreased Interest 0 1 1  Down, Depressed, Hopeless 0 1 1  PHQ - 2 Score 0 2 2  Altered sleeping 0 3 2  Tired, decreased energy 3 2 3   Change in appetite 0 2 2  Feeling bad or failure about yourself  0 1 1  Trouble concentrating 0 1 1  Moving slowly or fidgety/restless 2 0 0  Suicidal thoughts 0 0 0  PHQ-9 Score 5 11 11   Difficult doing work/chores  Somewhat difficult         08/13/2021   10:22 AM  GAD 7 : Generalized Anxiety Score  Nervous, Anxious, on Edge 1  Control/stop worrying 0  Worry too much - different things 2  Trouble relaxing 0  Restless 0  Easily annoyed or irritable 2  Afraid - awful might happen 0  Total GAD 7 Score 5     Review of Systems:   Pertinent items are noted in HPI Denies cramping/contractions, leakage of fluid, vaginal bleeding, abnormal vaginal discharge w/ itching/odor/irritation, headaches, visual changes, shortness of breath, chest pain, abdominal pain, severe nausea/vomiting, or problems with urination or bowel movements unless otherwise stated above.  Pertinent History Reviewed:  Reviewed past medical,surgical, social, obstetrical and family history.   Reviewed problem list, medications and allergies. OB History  Gravida Para Term Preterm AB Living  5 4 4  0 0 4  SAB IAB Ectopic Multiple Live Births  0 0 0 0 4    # Outcome Date GA Lbr Len/2nd Weight Sex Delivery Anes PTL Lv  5 Current           4 Term 03/13/18 [redacted]w[redacted]d 08:14 7 lb 11.8 oz (3.51 kg) M Vag-Spont None  LIV  3 Term 04/23/16 [redacted]w[redacted]d 01:50 / 00:09 6 lb 11.8 oz (3.056 kg) M Vag-Spont None  LIV     Complications: Gestational diabetes  2 Term 06/11/12 [redacted]w[redacted]d 14:17 / 00:05 7 lb 0.6 oz (3.192 kg) M Vag-Spont None N LIV     Birth Comments: Born at 58 weeks to 26 y.o.  [redacted]w[redacted]d. No issue or complication around birth or delivery.   1 Term 04/16/11 [redacted]w[redacted]d 14:50 / 00:39 5 lb 7.7 oz (2.486 kg) M Vag-Spont Local N LIV   Physical Assessment:   Vitals:   08/13/21 0959  BP: 106/70  Pulse: 79  Weight: 172 lb (78 kg)  Body mass index is 29.52 kg/m.       Physical Examination:  General appearance - well appearing, and in no distress  Mental status - alert, oriented to person, place, and time  Psych:  She has a normal mood and affect  Skin - warm and dry, normal color, no suspicious lesions noted  Chest - effort normal, all lung fields clear to auscultation bilaterally  Heart - normal rate and regular rhythm  Abdomen - soft, nontender  Extremities:  No swelling or varicosities noted  Pelvic - VULVA: normal appearing vulva with no masses, tenderness or lesions  VAGINA: normal appearing vagina with normal color and discharge, no lesions  CERVIX: normal appearing cervix without discharge or lesions, no CMT  Thin prep pap is done w/ reflex HR HPV cotesting  Chaperone:  Bien, CMA     TODAY'S ANATOMY U/S [place] 20+2 wks,breech,anterior placenta gr 0,normal ovaries,CX 4.3 cm,SVP of fluid 5.1 cm,EFW 351 g,anatomy complete,no obvious abnormalities,EDD 12/29/2021 by today's ultrasound   No results found for this or any previous visit (from the past 24 hour(s)).  Assessment & Plan:  1) Low-Risk  Pregnancy G5P4004 at [redacted]w[redacted]d with an Estimated Date of Delivery: 12/29/21   2) Initial OB visit  3) H/O GDM> A1C today  4) Late care @ 20wks  5) Headaches> gave printed prevention/relief measures   Meds: No orders of the defined types were placed in this encounter.   Initial labs obtained Continue prenatal vitamins Reviewed n/v relief measures and warning s/s to report Reviewed recommended weight gain based on pre-gravid BMI Encouraged well-balanced diet Genetic & carrier screening discussed: requests Panorama, NT/IT, and Horizon  Ultrasound discussed; fetal survey: requested CCNC completed> form faxed if has or is planning to apply for medicaid The nature of [place] [facility] [employer] with multiple MDs and other Advanced Practice Providers was explained to patient; also emphasized that fellows, residents, and students are part of our team.  Follow-up: Return in about 4 weeks (around 09/10/2021) for LROB, CNM, in person or online.   Orders Placed This Encounter  Procedures   Culture, OB Urine   Hemoglobin A1c   CBC/D/Plt+RPR+Rh+ABO+RubIgG...   Panorama Prenatal Test Full Panel   HORIZON CUSTOM    Cheral Marker CNM, [facility] 08/13/2021 10:40 AM

## 2021-08-16 LAB — CYTOLOGY - PAP
Chlamydia: NEGATIVE
Comment: NEGATIVE
Comment: NEGATIVE
Comment: NORMAL
Diagnosis: NEGATIVE
High risk HPV: NEGATIVE
Neisseria Gonorrhea: NEGATIVE

## 2021-08-16 LAB — URINE CULTURE, OB REFLEX

## 2021-08-16 LAB — CULTURE, OB URINE

## 2021-08-19 DIAGNOSIS — O99891 Other specified diseases and conditions complicating pregnancy: Secondary | ICD-10-CM | POA: Insufficient documentation

## 2021-08-19 MED ORDER — NITROFURANTOIN MONOHYD MACRO 100 MG PO CAPS: 100.0000 mg | ORAL_CAPSULE | Freq: Two times a day (BID) | ORAL | 0 refills | Status: DC

## 2021-08-19 NOTE — Addendum Note (Signed)
Addended by: Cheral Marker on: 08/19/2021 01:41 PM   Modules accepted: Orders

## 2021-08-23 ENCOUNTER — Encounter: Payer: Self-pay | Admitting: Women's Health

## 2021-08-29 ENCOUNTER — Encounter: Payer: Self-pay | Admitting: Women's Health

## 2021-09-13 ENCOUNTER — Encounter: Payer: Self-pay | Admitting: Advanced Practice Midwife

## 2021-09-13 ENCOUNTER — Ambulatory Visit (INDEPENDENT_AMBULATORY_CARE_PROVIDER_SITE_OTHER): Payer: Self-pay | Admitting: Advanced Practice Midwife

## 2021-09-13 VITALS — BP 100/68 | HR 85 | Wt 179.5 lb

## 2021-09-13 DIAGNOSIS — O99891 Other specified diseases and conditions complicating pregnancy: Secondary | ICD-10-CM

## 2021-09-13 DIAGNOSIS — Z3A24 24 weeks gestation of pregnancy: Secondary | ICD-10-CM

## 2021-09-13 DIAGNOSIS — Z87440 Personal history of urinary (tract) infections: Secondary | ICD-10-CM

## 2021-09-13 DIAGNOSIS — Z3480 Encounter for supervision of other normal pregnancy, unspecified trimester: Secondary | ICD-10-CM

## 2021-09-13 DIAGNOSIS — R8271 Bacteriuria: Secondary | ICD-10-CM

## 2021-09-13 NOTE — Progress Notes (Signed)
   LOW-RISK PREGNANCY VISIT Patient name: Jamie Lutz MRN [id number]  Date of birth: April 13, 1995 Chief Complaint:   Routine Prenatal Visit (Repeat urine culture; sometimes she sees floaters; headache at times)  History of Present Illness:   Jamie Lutz is a 26 y.o. [id number] female at [redacted]w[redacted]d with an Estimated Date of Delivery: 12/29/21 being seen today for ongoing management of a low-risk pregnancy.  Today she reports  some vision floaters at times; RLP discomfort w walking for exercise; some carpal tunnel symptoms . Contractions: Irritability. Vag. Bleeding: None.  Movement: Present. denies leaking of fluid. Review of Systems:   Pertinent items are noted in HPI Denies abnormal vaginal discharge w/ itching/odor/irritation, headaches, visual changes, shortness of breath, chest pain, abdominal pain, severe nausea/vomiting, or problems with urination or bowel movements unless otherwise stated above. Pertinent History Reviewed:  Reviewed past medical,surgical, social, obstetrical and family history.  Reviewed problem list, medications and allergies. Physical Assessment:   Vitals:   09/13/21 0903  BP: 100/68  Pulse: 85  Weight: 179 lb 8 oz (81.4 kg)  Body mass index is 30.81 kg/m.        Physical Examination:   General appearance: Well appearing, and in no distress  Mental status: Alert, oriented to person, place, and time  Skin: Warm & dry  Cardiovascular: Normal heart rate noted  Respiratory: Normal respiratory effort, no distress  Abdomen: Soft, gravid, nontender  Pelvic: Cervical exam deferred         Extremities: Edema: None  Fetal Status: Fetal Heart Rate (bpm): 150 Fundal Height: 24 cm Movement: Present    No results found for this or any previous visit (from the past 24 hour(s)).  Assessment & Plan:  1) Low-risk pregnancy G5P4004 at [redacted]w[redacted]d with an Estimated Date of Delivery: 12/29/21   2) RLP, recommended maternity support belt- try consignment stores first  3)  Unsure if will do PN2, she is checking price of GTT w [facility]; may want to get meter and check sugars instead (had GDMA1 w 3rd preg)  4) Previous UTI, POC today   Meds: No orders of the defined types were placed in this encounter.  Labs/procedures today: urine culture  Plan:  Continue routine obstetrical care   Reviewed: Preterm labor symptoms and general obstetric precautions including but not limited to vaginal bleeding, contractions, leaking of fluid and fetal movement were reviewed in detail with the patient.  All questions were answered. Doesn't have home bp cuff. Check bp weekly, let us know if >140/90.   Follow-up: Return in about 3 weeks (around 10/04/2021) for LROB, PN2, in person.  Orders Placed This Encounter  Procedures   Urine Culture   Arabella Merles CNM 09/13/2021 9:22 AM

## 2021-09-13 NOTE — Patient Instructions (Signed)
Jamie Lutz, I greatly value your feedback.  If you receive a survey following your visit with [place] today, we appreciate you taking the time to fill it out.  Thanks, Philipp Deputy, CNM   You will have your sugar test next visit.  Please do not eat or drink anything after midnight the night before you come, not even water.  You will be here for at least two hours.  Please make an appointment online for the bloodwork at [email or web address] for 8:30am (or as close to this as possible). Make sure you select the [facility] service center. The day of the appointment, check in with our office first, then you will go to [facility] to start the sugar test.    [facility] HAS MOVED!!! It is now [facility] ([address] [place], [place] [address]) Entrance C, located off of [address] valet parking  Go to [email or web address] to register for FREE online childbirth classes   Call the office [phone]) or go to [facility] if: You begin to have strong, frequent contractions Your water breaks.  Sometimes it is a big gush of fluid, sometimes it is just a trickle that keeps getting your panties wet or running down your legs You have vaginal bleeding.  It is normal to have a small amount of spotting if your cervix was checked.  You don't feel your baby moving like normal.  If you don't, get you something to eat and drink and lay down and focus on feeling your baby move.   If your baby is still not moving like normal, you should call the office or go to [facility].  [place] Pediatricians/Family Doctors: [facility] [phone]           [facility] [phone]                [facility] [phone] (usually not accepting new patients unless you have family there already, you are always welcome to call and ask)      [facility] Department [phone]       [facility] Pediatricians/Family Doctors:  [facility]: [phone] [facility]: [phone] [facility]: [phone]  [facility] Doctors:  [facility]: [phone]  [facility]: [phone]  [facility] Doctors: [facility]: [phone]   Home Blood Pressure Monitoring for Patients   Your provider has recommended that you check your blood pressure (BP) at least once a week at home. If you do not have a blood pressure cuff at home, one will be provided for you. Contact your provider if you have not received your monitor within 1 week.   Helpful Tips for Accurate Home Blood Pressure Checks  Don't smoke, exercise, or drink caffeine 30 minutes before checking your BP Use the restroom before checking your BP (a full bladder can raise your pressure) Relax in a comfortable upright chair Feet on the ground Left arm resting comfortably on a flat surface at the level of your heart Legs uncrossed Back supported Sit quietly and don't talk Place the cuff on your bare arm Adjust snuggly, so that only two fingertips can fit between your skin and the top of the cuff Check 2 readings separated by at least one minute Keep a log of your BP readings For a visual, please reference this diagram: http://ccnc.care/bpdiagram  Provider Name: [facility]     Phone: [phone]  Zone 1: ALL CLEAR  Continue to monitor your symptoms:  BP reading is less than 140 (top number) or less than 90 (bottom number)  No right upper stomach pain No headaches or seeing spots No feeling nauseated or throwing up No swelling in face and hands  Zone 2: CAUTION Call your doctor's office for any of the following:  BP reading is greater than 140 (top number) or greater than 90 (bottom number)  Stomach pain under your ribs in the middle or right side Headaches or seeing spots Feeling nauseated or throwing up Swelling in face and hands  Zone 3: EMERGENCY   Seek immediate medical care if you have any of the following:  BP reading is greater than160 (top number) or greater than 110 (bottom number) Severe headaches not improving with Tylenol Serious difficulty catching your breath Any worsening symptoms from Zone 2   Second Trimester of Pregnancy The second trimester is from week 13 through week 28, months 4 through 6. The second trimester is often a time when you feel your best. Your body has also adjusted to being pregnant, and you begin to feel better physically. Usually, morning sickness has lessened or quit completely, you may have more energy, and you may have an increase in appetite. The second trimester is also a time when the fetus is growing rapidly. At the end of the sixth month, the fetus is about 9 inches long and weighs about 1 pounds. You will likely begin to feel the baby move (quickening) between 18 and 20 weeks of the pregnancy. BODY CHANGES Your body goes through many changes during pregnancy. The changes vary from woman to woman.  Your weight will continue to increase. You will notice your lower abdomen bulging out. You may begin to get stretch marks on your hips, abdomen, and breasts. You may develop headaches that can be relieved by medicines approved by your health care provider. You may urinate more often because the fetus is pressing on your bladder. You may develop or continue to have heartburn as a result of your pregnancy. You may develop constipation because certain hormones are causing the muscles that push waste through your intestines to slow down. You may develop hemorrhoids or swollen, bulging veins (varicose veins). You may have back pain because of the weight gain and pregnancy hormones relaxing your joints between the bones in your pelvis and as a result of a shift in weight and the muscles that support your balance. Your breasts will continue to grow and be tender. Your gums may bleed and may be sensitive to  brushing and flossing. Dark spots or blotches (chloasma, mask of pregnancy) may develop on your face. This will likely fade after the baby is born. A dark line from your belly button to the pubic area (linea nigra) may appear. This will likely fade after the baby is born. You may have changes in your hair. These can include thickening of your hair, rapid growth, and changes in texture. Some women also have hair loss during or after pregnancy, or hair that feels dry or thin. Your hair will most likely return to normal after your baby is born. WHAT TO EXPECT AT YOUR PRENATAL VISITS During a routine prenatal visit: You will be weighed to make sure you and the fetus are growing normally. Your blood pressure will be taken. Your abdomen will be measured to track your baby's growth. The fetal heartbeat will be listened to. Any test results from the previous visit will be discussed. Your health care provider  may ask you: How you are feeling. If you are feeling the baby move. If you have had any abnormal symptoms, such as leaking fluid, bleeding, severe headaches, or abdominal cramping. If you have any questions. Other tests that may be performed during your second trimester include: Blood tests that check for: Low iron levels (anemia). Gestational diabetes (between 24 and 28 weeks). Rh antibodies. Urine tests to check for infections, diabetes, or protein in the urine. An ultrasound to confirm the proper growth and development of the baby. An amniocentesis to check for possible genetic problems. Fetal screens for spina bifida and Down syndrome. HOME CARE INSTRUCTIONS  Avoid all smoking, herbs, alcohol, and unprescribed drugs. These chemicals affect the formation and growth of the baby. Follow your health care provider's instructions regarding medicine use. There are medicines that are either safe or unsafe to take during pregnancy. Exercise only as directed by your health care provider.  Experiencing uterine cramps is a good sign to stop exercising. Continue to eat regular, healthy meals. Wear a good support bra for breast tenderness. Do not use hot tubs, steam rooms, or saunas. Wear your seat belt at all times when driving. Avoid raw meat, uncooked cheese, cat litter boxes, and soil used by cats. These carry germs that can cause birth defects in the baby. Take your prenatal vitamins. Try taking a stool softener (if your health care provider approves) if you develop constipation. Eat more high-fiber foods, such as fresh vegetables or fruit and whole grains. Drink plenty of fluids to keep your urine clear or pale yellow. Take warm sitz baths to soothe any pain or discomfort caused by hemorrhoids. Use hemorrhoid cream if your health care provider approves. If you develop varicose veins, wear support hose. Elevate your feet for 15 minutes, 3-4 times a day. Limit salt in your diet. Avoid heavy lifting, wear low heel shoes, and practice good posture. Rest with your legs elevated if you have leg cramps or low back pain. Visit your dentist if you have not gone yet during your pregnancy. Use a soft toothbrush to brush your teeth and be gentle when you floss. A sexual relationship may be continued unless your health care provider directs you otherwise. Continue to go to all your prenatal visits as directed by your health care provider. SEEK MEDICAL CARE IF:  You have dizziness. You have mild pelvic cramps, pelvic pressure, or nagging pain in the abdominal area. You have persistent nausea, vomiting, or diarrhea. You have a bad smelling vaginal discharge. You have pain with urination. SEEK IMMEDIATE MEDICAL CARE IF:  You have a fever. You are leaking fluid from your vagina. You have spotting or bleeding from your vagina. You have severe abdominal cramping or pain. You have rapid weight gain or loss. You have shortness of breath with chest pain. You notice sudden or extreme swelling  of your face, hands, ankles, feet, or legs. You have not felt your baby move in over an hour. You have severe headaches that do not go away with medicine. You have vision changes. Document Released: 12/31/2000 Document Revised: 01/11/2013 Document Reviewed: 03/09/2012 [facility] Patient Information 2015 [place], [place]. This information is not intended to replace advice given to you by your health care provider. Make sure you discuss any questions you have with your health care provider.

## 2021-09-15 LAB — URINE CULTURE

## 2021-10-04 ENCOUNTER — Other Ambulatory Visit: Payer: Self-pay

## 2021-10-04 ENCOUNTER — Encounter: Payer: Self-pay | Admitting: Advanced Practice Midwife

## 2021-10-18 ENCOUNTER — Ambulatory Visit (INDEPENDENT_AMBULATORY_CARE_PROVIDER_SITE_OTHER): Payer: Self-pay | Admitting: Obstetrics & Gynecology

## 2021-10-18 ENCOUNTER — Other Ambulatory Visit: Payer: Self-pay

## 2021-10-18 ENCOUNTER — Encounter: Payer: Self-pay | Admitting: Obstetrics & Gynecology

## 2021-10-18 VITALS — BP 115/73 | HR 87 | Wt 190.2 lb

## 2021-10-18 DIAGNOSIS — Z131 Encounter for screening for diabetes mellitus: Secondary | ICD-10-CM

## 2021-10-18 DIAGNOSIS — Z3480 Encounter for supervision of other normal pregnancy, unspecified trimester: Secondary | ICD-10-CM

## 2021-10-18 DIAGNOSIS — Z3A29 29 weeks gestation of pregnancy: Secondary | ICD-10-CM

## 2021-10-18 NOTE — Progress Notes (Signed)
   LOW-RISK PREGNANCY VISIT Patient name: Jamie Lutz MRN [id number]  Date of birth: [birth year] Chief Complaint:   Routine Prenatal Visit  History of Present Illness:   Jamie Lutz is a 26 y.o. A4T3646 female at [redacted]w[redacted]d with an Estimated Date of Delivery: 12/29/21 being seen today for ongoing management of a low-risk pregnancy.     08/13/2021   10:22 AM 10/30/2017   11:53 AM 11/15/2015    2:56 PM  Depression screen PHQ 2/9  Decreased Interest 0 1 1  Down, Depressed, Hopeless 0 1 1  PHQ - 2 Score 0 2 2  Altered sleeping 0 3 2  Tired, decreased energy 3 2 3   Change in appetite 0 2 2  Feeling bad or failure about yourself  0 1 1  Trouble concentrating 0 1 1  Moving slowly or fidgety/restless 2 0 0  Suicidal thoughts 0 0 0  PHQ-9 Score 5 11 11   Difficult doing work/chores  Somewhat difficult     Today she reports no complaints. Contractions: Irritability. Vag. Bleeding: None.  Movement: Present. denies leaking of fluid. Review of Systems:   Pertinent items are noted in HPI Denies abnormal vaginal discharge w/ itching/odor/irritation, headaches, visual changes, shortness of breath, chest pain, abdominal pain, severe nausea/vomiting, or problems with urination or bowel movements unless otherwise stated above. Pertinent History Reviewed:  Reviewed past medical,surgical, social, obstetrical and family history.  Reviewed problem list, medications and allergies. Physical Assessment:   Vitals:   10/18/21 1211  BP: 115/73  Pulse: 87  Weight: 190 lb 3.2 oz (86.3 kg)  Body mass index is 32.65 kg/m.        Physical Examination:   General appearance: Well appearing, and in no distress  Mental status: Alert, oriented to person, place, and time  Skin: Warm & dry  Cardiovascular: Normal heart rate noted  Respiratory: Normal respiratory effort, no distress  Abdomen: Soft, gravid, nontender  Pelvic: Cervical exam deferred         Extremities: Edema: None  Fetal  Status:     Movement: Present    Chaperone: n/a    No results found for this or any previous visit (from the past 24 hour(s)).  Assessment & Plan:  1) Low-risk pregnancy [id number] at [redacted]w[redacted]d with an Estimated Date of Delivery: 12/29/21   2) Hx of GDM,    Meds: No orders of the defined types were placed in this encounter.  Labs/procedures today: PN2  Plan:  Continue routine obstetrical care  Next visit: prefers in person    Reviewed: Preterm labor symptoms and general obstetric precautions including but not limited to vaginal bleeding, contractions, leaking of fluid and fetal movement were reviewed in detail with the patient.  All questions were answered. Has home bp cuff. Rx faxed to . Check bp weekly, let us know if >140/90.   Follow-up: Return in about 3 weeks (around 11/08/2021) for [place].  No orders of the defined types were placed in this encounter.   Florian Buff, MD 10/18/2021 12:37 PM

## 2021-10-19 LAB — ANTIBODY SCREEN: Antibody Screen: NEGATIVE

## 2021-10-19 LAB — CBC
Hematocrit: 33.7 % — ABNORMAL LOW (ref 34.0–46.6)
Hemoglobin: 11.1 g/dL (ref 11.1–15.9)
MCH: 27.8 pg (ref 26.6–33.0)
MCHC: 32.9 g/dL (ref 31.5–35.7)
MCV: 84 fL (ref 79–97)
Platelets: 184 10*3/uL (ref 150–450)
RBC: 4 x10E6/uL (ref 3.77–5.28)
RDW: 13.1 % (ref 11.7–15.4)
WBC: 10.9 10*3/uL — ABNORMAL HIGH (ref 3.4–10.8)

## 2021-10-19 LAB — GLUCOSE TOLERANCE, 2 HOURS W/ 1HR
Glucose, 1 hour: 193 mg/dL — ABNORMAL HIGH (ref 70–179)
Glucose, 2 hour: 109 mg/dL (ref 70–152)
Glucose, Fasting: 91 mg/dL (ref 70–91)

## 2021-10-19 LAB — RPR: RPR Ser Ql: NONREACTIVE

## 2021-10-19 LAB — HIV ANTIBODY (ROUTINE TESTING W REFLEX): HIV Screen 4th Generation wRfx: NONREACTIVE

## 2021-10-24 ENCOUNTER — Other Ambulatory Visit: Payer: Self-pay

## 2021-10-28 ENCOUNTER — Encounter: Payer: Self-pay | Admitting: *Deleted

## 2021-10-28 ENCOUNTER — Other Ambulatory Visit: Payer: Self-pay | Admitting: *Deleted

## 2021-10-28 ENCOUNTER — Telehealth: Payer: Self-pay

## 2021-10-28 DIAGNOSIS — O24410 Gestational diabetes mellitus in pregnancy, diet controlled: Secondary | ICD-10-CM

## 2021-10-28 DIAGNOSIS — O24419 Gestational diabetes mellitus in pregnancy, unspecified control: Secondary | ICD-10-CM | POA: Insufficient documentation

## 2021-10-28 MED ORDER — ACCU-CHEK GUIDE ME W/DEVICE KIT: 1.0000 | PACK | Freq: Four times a day (QID) | 0 refills | Status: DC

## 2021-10-28 MED ORDER — ACCU-CHEK GUIDE VI STRP: ORAL_STRIP | 12 refills | Status: DC

## 2021-10-28 MED ORDER — ACCU-CHEK SOFTCLIX LANCETS MISC: 12 refills | Status: DC

## 2021-10-28 NOTE — Telephone Encounter (Signed)
Patient called and stated that she is dizzy and not able to eat and is experiencing nausea.  Would like advice on what to do.  Would like to have a call from a nurse.

## 2021-10-29 NOTE — Telephone Encounter (Signed)
LMOVM returning patient's call.  

## 2021-11-05 ENCOUNTER — Other Ambulatory Visit: Payer: Self-pay

## 2021-11-05 ENCOUNTER — Encounter: Payer: 59 | Attending: Physician Assistant | Admitting: Registered"

## 2021-11-05 ENCOUNTER — Ambulatory Visit (INDEPENDENT_AMBULATORY_CARE_PROVIDER_SITE_OTHER): Payer: Self-pay | Admitting: Registered"

## 2021-11-05 DIAGNOSIS — O24410 Gestational diabetes mellitus in pregnancy, diet controlled: Secondary | ICD-10-CM

## 2021-11-05 NOTE — Progress Notes (Signed)
Patient was seen for Gestational Diabetes self-management on 11/05/2021  Start time 1620 and End time 1708   Estimated due date: 12/29/21; [redacted]w[redacted]d  Clinical: Medications: reviewed Medical History: Pt reports A1 GDM 6 yrs ago Labs: OGTT 1 hr 193, A1c n/a%   Dietary and Lifestyle History: Pt states doesn't remember much from last GDM experience. Pt states it was her 3rd pregnancy and did not have GDM with her 4th pregnancy.  Pt states she likes vegetables but has not been eating as much since being pregnant. Pt states milk bothers stomach, may be able to eat more [employer] yogurt.  Physical Activity: walk 2-3 x/week, 30 min Stress: not assessed Sleep: not assessed  24 hr Recall:  First Meal: cheese toast OR [place] sausage on English muffin Snack: cuties Second meal: 1 c rice, chicken, beans Snack: fruit  Third meal: Chicken nuggets, fries Snack: Beverages: a lot of water  NUTRITION INTERVENTION  Nutrition education (E-1) on the following topics:   Initial Follow-up  [x]  []  Definition of Gestational Diabetes [x]  []  Why dietary management is important in controlling blood glucose [x]  []  Effects each nutrient has on blood glucose levels [x]  []  Simple carbohydrates vs complex carbohydrates [x]  []  Fluid intake [x]  []  Creating a balanced meal plan [x]  []  Carbohydrate counting  [x]  []  When to check blood glucose levels [x]  []  Proper blood glucose monitoring techniques [x]  []  Effect of stress and stress reduction techniques  [x]  []  Exercise effect on blood glucose levels, appropriate exercise during pregnancy [x]  []  Importance of limiting caffeine and abstaining from alcohol and smoking [x]  []  Medications used for blood sugar control during pregnancy [x]  []  Hypoglycemia and rule of 15 [x]  []  Postpartum self care  Patient has a meter prior to visit. Patient is testing pre breakfast and 2 hours after each meal. FBS: 98-108 mg/dL Postprandial: 118-161 mg/dL  Patient instructed to  monitor glucose levels: FBS: 60 - ? 95 mg/dL (some clinics use 90 for cutoff) 1 hour: ? 140 mg/dL 2 hour: ? 120 mg/dL  Patient received handouts: Nutrition Diabetes and Pregnancy Carbohydrate Counting List  Patient will be seen for follow-up as needed.

## 2021-11-06 ENCOUNTER — Inpatient Hospital Stay (HOSPITAL_COMMUNITY)
Admission: AD | Admit: 2021-11-06 | Discharge: 2021-11-06 | Disposition: A | Discharge status: Home / Self Care | Payer: 59 | Attending: Obstetrics and Gynecology | Admitting: Obstetrics and Gynecology

## 2021-11-06 ENCOUNTER — Encounter (HOSPITAL_COMMUNITY): Payer: Self-pay | Admitting: Obstetrics and Gynecology

## 2021-11-06 DIAGNOSIS — Z3A32 32 weeks gestation of pregnancy: Secondary | ICD-10-CM

## 2021-11-06 DIAGNOSIS — O368130 Decreased fetal movements, third trimester, not applicable or unspecified: Secondary | ICD-10-CM | POA: Insufficient documentation

## 2021-11-06 DIAGNOSIS — R102 Pelvic and perineal pain: Secondary | ICD-10-CM | POA: Diagnosis not present

## 2021-11-06 DIAGNOSIS — O26893 Other specified pregnancy related conditions, third trimester: Secondary | ICD-10-CM | POA: Diagnosis not present

## 2021-11-06 DIAGNOSIS — O4703 False labor before 37 completed weeks of gestation, third trimester: Secondary | ICD-10-CM

## 2021-11-06 DIAGNOSIS — Z3689 Encounter for other specified antenatal screening: Secondary | ICD-10-CM

## 2021-11-06 DIAGNOSIS — O09293 Supervision of pregnancy with other poor reproductive or obstetric history, third trimester: Secondary | ICD-10-CM | POA: Diagnosis not present

## 2021-11-06 LAB — URINALYSIS, ROUTINE W REFLEX MICROSCOPIC
Bilirubin Urine: NEGATIVE
Glucose, UA: NEGATIVE mg/dL
Hgb urine dipstick: NEGATIVE
Ketones, ur: NEGATIVE mg/dL
Leukocytes,Ua: NEGATIVE
Nitrite: NEGATIVE
Protein, ur: NEGATIVE mg/dL
Specific Gravity, Urine: 1.005 (ref 1.005–1.030)
pH: 7 (ref 5.0–8.0)

## 2021-11-06 LAB — WET PREP, GENITAL
Clue Cells Wet Prep HPF POC: NONE SEEN
Sperm: NONE SEEN
Trich, Wet Prep: NONE SEEN
WBC, Wet Prep HPF POC: >=10 — AB (ref <10)
Yeast Wet Prep HPF POC: NONE SEEN

## 2021-11-06 MED ORDER — TERBUTALINE SULFATE 1 MG/ML IJ SOLN
0.2500 mg | Freq: Once | INTRAMUSCULAR | Status: AC
  Administered 2021-11-06: 0.25 mg via SUBCUTANEOUS
  Filled 2021-11-06: qty 1

## 2021-11-06 MED ORDER — LACTATED RINGERS IV BOLUS
1000.0000 mL | Freq: Once | INTRAVENOUS | Status: AC
  Administered 2021-11-06: 1000 mL via INTRAVENOUS

## 2021-11-06 NOTE — MAU Provider Note (Incomplete)
  History     CSN: [id number]  Arrival date and time: 11/06/21 2114   Event Date/Time   First Provider Initiated Contact with Patient 11/06/21 2156      Chief Complaint  Patient presents with  . Decreased Fetal Movement  . Abdominal Pain  . Pelvic Pain   26 y.o. [id number] _0 .3 wks presenting with ctx and DFM. Reports onset this am around 10. Frequency is q3-5 weeks. Denies VB or LOF. No recent IC. Reports less FM since this am. Reports drinking a lot of water.     OB History     Gravida  5   Para  4   Term  4   Preterm  0   AB  0   Living  4      SAB  0   IAB  0   Ectopic  0   Multiple  0   Live Births  4           Past Medical History:  Diagnosis Date  . Anxiety   . Depression   . Gestational diabetes     Past Surgical History:  Procedure Laterality Date  . PERINEAL LACERATION REPAIR  04/17/2011   Procedure: SUTURE REPAIR PERINEAL LACERATION;  Surgeon: Jonnie Kind, MD;  Location: [facility];  Service: Gynecology;  Laterality: N/A;    Family History  Problem Relation Age of Onset  . Hypothyroidism Mother   . Diabetes Maternal Grandmother   . Cancer Maternal Grandfather        esophagus    Social History   Tobacco Use  . Smoking status: Never  . Smokeless tobacco: Never  Vaping Use  . Vaping Use: Never used  Substance Use Topics  . Alcohol use: No    Alcohol/week: 0.0 standard drinks of alcohol  . Drug use: No    Allergies: No Known Allergies  Medications Prior to Admission  Medication Sig Dispense Refill Last Dose  . Prenatal Vit-Fe Fumarate-FA (PRENATAL PO) Take by mouth.   11/05/2021  . Accu-Chek Softclix Lancets lancets Use as instructed to check blood sugar 4 times daily 100 each 12   . Blood Glucose Monitoring Suppl (ACCU-CHEK GUIDE ME) w/Device KIT 1 each by Does not apply route 4 (four) times daily. 1 kit 0   . glucose blood (ACCU-CHEK GUIDE) test strip Use as instructed to check blood sugar 4 times daily 50 each 12      Review of Systems Physical Exam   Blood pressure 116/63, pulse 87, temperature 98 F (36.7 C), temperature source Oral, resp. rate 18, height 5' 4" (1.626 m), weight 87.9 kg, last menstrual period 04/22/2021, SpO2 100 %, unknown if currently breastfeeding.  Physical Exam EFM: 150 bpm, mod variability, + accels, no decels Toco: 2-4  MAU Course  Procedures LR Terbutaline  MDM ***  Assessment and Plan  ***  Julianne Handler, CNM 11/06/2021, 10:07 PM

## 2021-11-06 NOTE — MAU Provider Note (Signed)
History     CSN: [id number]  Arrival date and time: 11/06/21 2114   Event Date/Time   First Provider Initiated Contact with Patient 11/06/21 2156      Chief Complaint  Patient presents with   Decreased Fetal Movement   Abdominal Pain   Pelvic Pain   26 y.o. [id number] _0 .3 wks presenting with ctx and DFM. Reports onset this am around 10. Frequency is q3-5 weeks. Reports increased pelvic pressure. Denies VB or LOF. No recent IC. Reports +FM but less than normal since this am. Reports drinking a lot of water.     OB History     Gravida  5   Para  4   Term  4   Preterm  0   AB  0   Living  4      SAB  0   IAB  0   Ectopic  0   Multiple  0   Live Births  4           Past Medical History:  Diagnosis Date   Anxiety    Depression    Gestational diabetes     Past Surgical History:  Procedure Laterality Date   PERINEAL LACERATION REPAIR  04/17/2011   Procedure: SUTURE REPAIR PERINEAL LACERATION;  Surgeon: Jonnie Kind, MD;  Location: [facility];  Service: Gynecology;  Laterality: N/A;    Family History  Problem Relation Age of Onset   Hypothyroidism Mother    Diabetes Maternal Grandmother    Cancer Maternal Grandfather        esophagus    Social History   Tobacco Use   Smoking status: Never   Smokeless tobacco: Never  Vaping Use   Vaping Use: Never used  Substance Use Topics   Alcohol use: No    Alcohol/week: 0.0 standard drinks of alcohol   Drug use: No    Allergies: No Known Allergies  Medications Prior to Admission  Medication Sig Dispense Refill Last Dose   Prenatal Vit-Fe Fumarate-FA (PRENATAL PO) Take by mouth.   11/05/2021   Accu-Chek Softclix Lancets lancets Use as instructed to check blood sugar 4 times daily 100 each 12    Blood Glucose Monitoring Suppl (ACCU-CHEK GUIDE ME) w/Device KIT 1 each by Does not apply route 4 (four) times daily. 1 kit 0    glucose blood (ACCU-CHEK GUIDE) test strip Use as instructed to check blood  sugar 4 times daily 50 each 12     Review of Systems  Gastrointestinal:  Positive for abdominal pain (ctx). Negative for constipation, diarrhea, nausea and vomiting.  Genitourinary:  Negative for dysuria, hematuria, urgency, vaginal bleeding and vaginal discharge.   Physical Exam   Blood pressure 116/63, pulse 87, temperature 98 F (36.7 C), temperature source Oral, resp. rate 18, height 5' 4" (1.626 m), weight 87.9 kg, last menstrual period 04/22/2021, SpO2 100 %, unknown if currently breastfeeding.  Physical Exam Vitals and nursing note reviewed. Exam conducted with a chaperone present.  Constitutional:      General: She is not in acute distress.    Appearance: Normal appearance.  HENT:     Head: Normocephalic and atraumatic.  Cardiovascular:     Rate and Rhythm: Normal rate.  Pulmonary:     Effort: Pulmonary effort is normal. No respiratory distress.  Abdominal:     Palpations: Abdomen is soft.     Tenderness: There is no abdominal tenderness.  Genitourinary:    Comments: VE: closed/thick Musculoskeletal:  General: Normal range of motion.     Cervical back: Normal range of motion.  Skin:    General: Skin is warm and dry.  Neurological:     General: No focal deficit present.     Mental Status: She is alert and oriented to person, place, and time.  Psychiatric:        Mood and Affect: Mood normal.        Behavior: Behavior normal.   EFM: 150 bpm, mod variability, + accels, no decels Toco: 2-4  Results for orders placed or performed during the hospital encounter of 11/06/21 (from the past 24 hour(s))  Urinalysis, Routine w reflex microscopic Urine, Clean Catch     Status: Abnormal   Collection Time: 11/06/21  9:33 PM  Result Value Ref Range   Color, Urine STRAW (A) YELLOW   APPearance HAZY (A) CLEAR   Specific Gravity, Urine 1.005 1.005 - 1.030   pH 7.0 5.0 - 8.0   Glucose, UA NEGATIVE NEGATIVE mg/dL   Hgb urine dipstick NEGATIVE NEGATIVE   Bilirubin Urine  NEGATIVE NEGATIVE   Ketones, ur NEGATIVE NEGATIVE mg/dL   Protein, ur NEGATIVE NEGATIVE mg/dL   Nitrite NEGATIVE NEGATIVE   Leukocytes,Ua NEGATIVE NEGATIVE  Wet prep, genital     Status: Abnormal   Collection Time: 11/06/21 10:07 PM  Result Value Ref Range   Yeast Wet Prep HPF POC NONE SEEN NONE SEEN   Trich, Wet Prep NONE SEEN NONE SEEN   Clue Cells Wet Prep HPF POC NONE SEEN NONE SEEN   WBC, Wet Prep HPF POC >=10 (A) <10   Sperm NONE SEEN    MAU Course  Procedures LR Terbutaline  MDM Labs ordered and reviewed. Feeling less ctx since fluids and meds, now irregular on toco. Reports good FM similar to baseline. No signs of PTL. Stable for discharge home.   Assessment and Plan   1. [redacted] weeks gestation of pregnancy   2. NST (non-stress test) reactive   3. Preterm uterine contractions in third trimester, antepartum    Discharge home Follow up at [place] tomorrow as scheduled [facility] tomorrow PTL precautions FMCs  Allergies as of 11/06/2021   No Known Allergies      Medication List     TAKE these medications    Accu-Chek Guide Me w/Device Kit 1 each by Does not apply route 4 (four) times daily.   Accu-Chek Guide test strip Generic drug: glucose blood Use as instructed to check blood sugar 4 times daily   Accu-Chek Softclix Lancets lancets Use as instructed to check blood sugar 4 times daily   PRENATAL PO Take by mouth.        Julianne Handler, CNM 11/06/2021, 10:07 PM

## 2021-11-06 NOTE — MAU Note (Addendum)
..  Jamie Lutz - [name] is a 26 y.o. at [redacted]w[redacted]d here in MAU reporting: decreased fetal movement today, last movement was 40 minutes ago.  Reports pelvic pain and contractions that began around 10 am and are every few minutes now  Pain score: 5/10 Vitals:   11/06/21 2128 11/06/21 2130  BP: 116/63 116/63  Pulse: 86 87  Resp: 18   Temp: 98 F (36.7 C)   SpO2: 100%      FHT:160 Lab orders placed from triage: UA

## 2021-11-07 ENCOUNTER — Encounter: Payer: Self-pay | Admitting: Obstetrics & Gynecology

## 2021-11-07 ENCOUNTER — Ambulatory Visit (INDEPENDENT_AMBULATORY_CARE_PROVIDER_SITE_OTHER): Payer: 59 | Admitting: Obstetrics & Gynecology

## 2021-11-07 VITALS — BP 107/66 | HR 88 | Wt 192.0 lb

## 2021-11-07 DIAGNOSIS — O24410 Gestational diabetes mellitus in pregnancy, diet controlled: Secondary | ICD-10-CM

## 2021-11-07 DIAGNOSIS — Z3480 Encounter for supervision of other normal pregnancy, unspecified trimester: Secondary | ICD-10-CM

## 2021-11-07 DIAGNOSIS — Z3A32 32 weeks gestation of pregnancy: Secondary | ICD-10-CM

## 2021-11-07 LAB — GC/CHLAMYDIA PROBE AMP (~~LOC~~) NOT AT ARMC
Chlamydia: NEGATIVE
Comment: NEGATIVE
Comment: NORMAL
Neisseria Gonorrhea: NEGATIVE

## 2021-11-07 MED ORDER — NIFEDIPINE 10 MG PO CAPS: 10.0000 mg | ORAL_CAPSULE | Freq: Three times a day (TID) | ORAL | 1 refills | Status: DC

## 2021-11-07 NOTE — Progress Notes (Signed)
LOW-RISK PREGNANCY VISIT Patient name: Jamie Lutz MRN [id number]  Date of birth: [birth year] Chief Complaint:   Routine Prenatal Visit  History of Present Illness:   Jamie Lutz is a 26 y.o. [id number] female at [redacted]w[redacted]d with an Estimated Date of Delivery: 12/29/21 being seen today for ongoing management of a low-risk pregnancy.     08/13/2021   10:22 AM 10/30/2017   11:53 AM 11/15/2015    2:56 PM  Depression screen PHQ 2/9  Decreased Interest 0 1 1  Down, Depressed, Hopeless 0 1 1  PHQ - 2 Score 0 2 2  Altered sleeping 0 3 2  Tired, decreased energy 3 2 3   Change in appetite 0 2 2  Feeling bad or failure about yourself  0 1 1  Trouble concentrating 0 1 1  Moving slowly or fidgety/restless 2 0 0  Suicidal thoughts 0 0 0  PHQ-9 Score 5 11 11   Difficult doing work/chores  Somewhat difficult     Today she reports irregualr contractions. Contractions: Irregular. Vag. Bleeding: None.  Movement: Present. denies leaking of fluid. Review of Systems:   Pertinent items are noted in HPI Denies abnormal vaginal discharge w/ itching/odor/irritation, headaches, visual changes, shortness of breath, chest pain, abdominal pain, severe nausea/vomiting, or problems with urination or bowel movements unless otherwise stated above. Pertinent History Reviewed:  Reviewed past medical,surgical, social, obstetrical and family history.  Reviewed problem list, medications and allergies. Physical Assessment:   Vitals:   11/07/21 0850  BP: 107/66  Pulse: 88  Weight: 192 lb (87.1 kg)  Body mass index is 32.96 kg/m.        Physical Examination:   General appearance: Well appearing, and in no distress  Mental status: Alert, oriented to person, place, and time  Skin: Warm & dry  Cardiovascular: Normal heart rate noted  Respiratory: Normal respiratory effort, no distress  Abdomen: Soft, gravid, nontender  Pelvic: Cervical exam deferred         Extremities: Edema: None  Fetal Status: Fetal  Heart Rate (bpm): 155 Fundal Height: 34 cm Movement: Present    Chaperone: n/a    Results for orders placed or performed during the hospital encounter of 11/06/21 (from the past 24 hour(s))  Urinalysis, Routine w reflex microscopic Urine, Clean Catch   Collection Time: 11/06/21  9:33 PM  Result Value Ref Range   Color, Urine STRAW (A) YELLOW   APPearance HAZY (A) CLEAR   Specific Gravity, Urine 1.005 1.005 - 1.030   pH 7.0 5.0 - 8.0   Glucose, UA NEGATIVE NEGATIVE mg/dL   Hgb urine dipstick NEGATIVE NEGATIVE   Bilirubin Urine NEGATIVE NEGATIVE   Ketones, ur NEGATIVE NEGATIVE mg/dL   Protein, ur NEGATIVE NEGATIVE mg/dL   Nitrite NEGATIVE NEGATIVE   Leukocytes,Ua NEGATIVE NEGATIVE  Wet prep, genital   Collection Time: 11/06/21 10:07 PM  Result Value Ref Range   Yeast Wet Prep HPF POC NONE SEEN NONE SEEN   Trich, Wet Prep NONE SEEN NONE SEEN   Clue Cells Wet Prep HPF POC NONE SEEN NONE SEEN   WBC, Wet Prep HPF POC >=10 (A) <10   Sperm NONE SEEN     Assessment & Plan:  1) Low-risk pregnancy [id number] at [redacted]w[redacted]d with an Estimated Date of Delivery: 12/29/21   2) A1DM good control,    Meds:  Meds ordered this encounter  Medications   NIFEdipine (PROCARDIA) 10 MG capsule    Sig: Take 1 capsule (10 mg total) by mouth 3 (  three) times daily.    Dispense:  20 capsule    Refill:  1   Labs/procedures today:   Plan:  Continue routine obstetrical care  Next visit: prefers in person    Reviewed: Preterm labor symptoms and general obstetric precautions including but not limited to vaginal bleeding, contractions, leaking of fluid and fetal movement were reviewed in detail with the patient.  All questions were answered. Has home bp cuff. Rx faxed to . Check bp weekly, let us know if >140/90.   Follow-up: Return in about 2 weeks (around 11/21/2021) for [place].  No orders of the defined types were placed in this encounter.   Florian Buff, MD 11/07/2021 9:35 AM

## 2021-11-13 ENCOUNTER — Telehealth: Payer: Self-pay | Admitting: *Deleted

## 2021-11-13 ENCOUNTER — Inpatient Hospital Stay (EMERGENCY_DEPARTMENT_HOSPITAL)
Admission: AD | Admit: 2021-11-13 | Discharge: 2021-11-13 | Disposition: A | Discharge status: Home / Self Care | Payer: 59 | Source: Home / Self Care | Attending: Obstetrics and Gynecology | Admitting: Obstetrics and Gynecology

## 2021-11-13 ENCOUNTER — Other Ambulatory Visit: Payer: Self-pay

## 2021-11-13 ENCOUNTER — Encounter (HOSPITAL_COMMUNITY): Payer: Self-pay | Admitting: Obstetrics and Gynecology

## 2021-11-13 DIAGNOSIS — O4700 False labor before 37 completed weeks of gestation, unspecified trimester: Secondary | ICD-10-CM | POA: Diagnosis not present

## 2021-11-13 DIAGNOSIS — O4703 False labor before 37 completed weeks of gestation, third trimester: Secondary | ICD-10-CM | POA: Insufficient documentation

## 2021-11-13 DIAGNOSIS — O24439 Gestational diabetes mellitus in the puerperium, unspecified control: Secondary | ICD-10-CM | POA: Diagnosis not present

## 2021-11-13 DIAGNOSIS — O24410 Gestational diabetes mellitus in pregnancy, diet controlled: Secondary | ICD-10-CM

## 2021-11-13 DIAGNOSIS — Z3A33 33 weeks gestation of pregnancy: Secondary | ICD-10-CM

## 2021-11-13 DIAGNOSIS — Z79899 Other long term (current) drug therapy: Secondary | ICD-10-CM | POA: Insufficient documentation

## 2021-11-13 DIAGNOSIS — O0993 Supervision of high risk pregnancy, unspecified, third trimester: Secondary | ICD-10-CM

## 2021-11-13 LAB — URINALYSIS, ROUTINE W REFLEX MICROSCOPIC
Bilirubin Urine: NEGATIVE
Glucose, UA: NEGATIVE mg/dL
Hgb urine dipstick: NEGATIVE
Ketones, ur: NEGATIVE mg/dL
Nitrite: NEGATIVE
Protein, ur: NEGATIVE mg/dL
Specific Gravity, Urine: 1.015 (ref 1.005–1.030)
pH: 7 (ref 5.0–8.0)

## 2021-11-13 MED ORDER — TERBUTALINE SULFATE 1 MG/ML IJ SOLN
0.2500 mg | Freq: Once | INTRAMUSCULAR | Status: AC
  Administered 2021-11-13: 0.25 mg via SUBCUTANEOUS
  Filled 2021-11-13: qty 1

## 2021-11-13 MED ORDER — OXYCODONE-ACETAMINOPHEN 5-325 MG PO TABS: 1.0000 | ORAL_TABLET | Freq: Four times a day (QID) | ORAL | 0 refills | Status: DC | PRN

## 2021-11-13 MED ORDER — LACTATED RINGERS IV BOLUS
1000.0000 mL | Freq: Once | INTRAVENOUS | Status: AC
  Administered 2021-11-13: 1000 mL via INTRAVENOUS

## 2021-11-13 MED ORDER — NIFEDIPINE 10 MG PO CAPS
10.0000 mg | ORAL_CAPSULE | ORAL | Status: AC | PRN
  Administered 2021-11-13 (×3): 10 mg via ORAL
  Filled 2021-11-13 (×3): qty 1

## 2021-11-13 NOTE — Telephone Encounter (Signed)
Patient called with c/o continued contractions.  States she has taken the Procardia and it doesn't seem to be helping.  Feels she could be dehydrated as she had food poisoning earlier this week and experienced vomiting and diarrhea.  She has been drinking water but her urine is still very dark. Advised patient that if she was feeling dehydrated, she would need to go to the ER for fluids and to r/o labor since she is still having contractions despite taking the Procardia.  Patient stated she would go.

## 2021-11-13 NOTE — MAU Note (Signed)
Jamie Lutz is a 26 y.o. at [redacted]w[redacted]d here in MAU reporting: she's having regular ctxs since 1300 this afternoon.  States was prescribed medication for ctxs last week, but didn't take it.  Denies VB or LOF.  States had N/V/D over the weekend, but symptoms now resolved. LMP: NA Onset of complaint: today Pain score: 0 Vitals:   11/13/21 1806  BP: 109/69  Pulse: (!) 105  Resp: 18  Temp: 98 F (36.7 C)  SpO2: 99%     QZE:SPQZRAQT d/t apparel Lab orders placed from triage:   None

## 2021-11-13 NOTE — MAU Provider Note (Cosign Needed Addendum)
History     [id number]  Arrival date and time: 11/13/21 1741    Chief Complaint  Patient presents with   Contractions     HPI Jamie Lutz is a 26 y.o. at 54w3dwho presents for contractions. Reports regular painful contractions since 1 pm today. Can't tell how frequent they are. States some feel like tightening while others take her breath away. Was prescribed procardia last week for preterm contractions but hasn't been taken it for her symptoms. Had GI illness over the weekend & states she feels dehydrated. Hasn't had vomiting or diarrhea since Sunday. Denies dysuria, vaginal bleeding, or LOF. Reports normal fetal movement.    OB History     Gravida  5   Para  4   Term  4   Preterm  0   AB  0   Living  4      SAB  0   IAB  0   Ectopic  0   Multiple  0   Live Births  4           Past Medical History:  Diagnosis Date   Anxiety    Depression    Gestational diabetes     Past Surgical History:  Procedure Laterality Date   PERINEAL LACERATION REPAIR  04/17/2011   Procedure: SUTURE REPAIR PERINEAL LACERATION;  Surgeon: JJonnie Kind MD;  [facility];  Service: Gynecology;  Laterality: N/A;    Family History  Problem Relation Age of Onset   Hypothyroidism Mother    Diabetes Maternal Grandmother    Cancer Maternal Grandfather        esophagus    No Known Allergies  No current facility-administered medications on file prior to encounter.   Current Outpatient Medications on File Prior to Encounter  Medication Sig Dispense Refill   NIFEdipine (PROCARDIA) 10 MG capsule Take 1 capsule (10 mg total) by mouth 3 (three) times daily. 20 capsule 1   Prenatal Vit-Fe Fumarate-FA (PRENATAL PO) Take by mouth.     Accu-Chek Softclix Lancets lancets Use as instructed to check blood sugar 4 times daily 100 each 12   Blood Glucose Monitoring Suppl (ACCU-CHEK GUIDE ME) w/Device KIT 1 each by Does not apply route 4 (four) times daily. 1 kit 0    glucose blood (ACCU-CHEK GUIDE) test strip Use as instructed to check blood sugar 4 times daily 50 each 12     ROS Pertinent positives and negative per HPI, all others reviewed and negative  Physical Exam   BP (!) 105/59 Comment: RN made NP aware. RN to give second dose  Pulse 95   Temp 98 F (36.7 C) (Oral)   Resp 18   Ht _0  (1.626 m)   Wt 87.5 kg   LMP 04/22/2021 (Within Months)   SpO2 100%   BMI 33.13 kg/m   Patient Vitals for the past 24 hrs:  BP Temp Temp src Pulse Resp SpO2 Height Weight  11/13/21 1946 (!) 105/59 -- -- 95 -- 100 % -- --  11/13/21 1925 106/68 -- -- 86 -- -- -- --  11/13/21 1816 103/62 -- -- (!) 102 -- -- -- --  11/13/21 1806 109/69 98 F (36.7 C) Oral (!) 105 18 99 % -- --  11/13/21 1759 -- -- -- -- -- -- _1  (1.626 m) 87.5 kg    Physical Exam Vitals and nursing note reviewed. Exam conducted with a chaperone present.  Constitutional:      General:  She is not in acute distress.    Appearance: Normal appearance.  HENT:     Head: Normocephalic and atraumatic.  Eyes:     General: No scleral icterus.    Conjunctiva/sclera: Conjunctivae normal.  Pulmonary:     Effort: Pulmonary effort is normal. No respiratory distress.  Skin:    General: Skin is warm and dry.  Neurological:     Mental Status: She is alert.  Psychiatric:        Mood and Affect: Mood normal.        Behavior: Behavior normal.     Cervical Exam Dilation: 1 Effacement (%): Thick Cervical Position: Posterior Station: -3 Exam by:: Erin Lawrence,NP   FHT Baseline 145, moderate variability, 15x15 accels, no decels Toco: Q 2-3 minutes  Cat: 1  Labs No results found for this or any previous visit (from the past 24 hour(s)).  Imaging No results found.  MAU Course  Procedures Lab Orders         Urinalysis, Routine w reflex microscopic Urine, Clean Catch    Meds ordered this encounter  Medications   lactated ringers bolus 1,000 mL   NIFEdipine (PROCARDIA) capsule  10 mg   Imaging Orders  No imaging studies ordered today    MDM Cervix 1/thick/-3. Patient contracting every 2-3 minutes. Started IV fluid bolus & procardia protocol.   Cervix unchanged after 1 hour of monitoring. Patient received 2 doses of procardia so far but continues to have regular contractions that she reports as painful. Will try 3rd dose of procardia and if still painful she is agreeable to terbutaline.   Care turned over to Hansel Feinstein CNM Jorje Guild, NP  11/13/2021 8:08 PM   2125: Patient states UCs are improved but still rates them a "5" after third dose of Procardia.  Agrees to try Terbutaline  2220:   Still contracting after above.  Cervix unchanged.  D/W Dr Damita Dunnings. No further tocolysis now.  Will hold on BMZ but if she comes back with more PTL, would give it then  Assessment and Plan  A: SIUP at 64w3dPreterm uterine contractions No change in cervix  P:  Discharge home PTL precautions Rx Percocet #6tab for pain (pt driving) Message sent to office to get appt sooner than November Encouraged to return if she develops worsening of symptoms, increase in pain, fever, or other concerning symptoms.   WSeabron Spates CNM

## 2021-11-14 ENCOUNTER — Other Ambulatory Visit: Payer: Self-pay

## 2021-11-14 ENCOUNTER — Encounter (HOSPITAL_COMMUNITY): Payer: Self-pay | Admitting: Obstetrics and Gynecology

## 2021-11-14 ENCOUNTER — Inpatient Hospital Stay (HOSPITAL_COMMUNITY)
Admission: AD | Admit: 2021-11-14 | Discharge: 2021-11-15 | DRG: 776 | Disposition: A | Discharge status: Home / Self Care | Payer: 59 | Attending: Obstetrics & Gynecology | Admitting: Obstetrics & Gynecology

## 2021-11-14 DIAGNOSIS — Z23 Encounter for immunization: Secondary | ICD-10-CM | POA: Diagnosis not present

## 2021-11-14 DIAGNOSIS — Z8632 Personal history of gestational diabetes: Secondary | ICD-10-CM | POA: Diagnosis present

## 2021-11-14 DIAGNOSIS — Z3A33 33 weeks gestation of pregnancy: Secondary | ICD-10-CM

## 2021-11-14 DIAGNOSIS — O0990 Supervision of high risk pregnancy, unspecified, unspecified trimester: Secondary | ICD-10-CM

## 2021-11-14 DIAGNOSIS — O4700 False labor before 37 completed weeks of gestation, unspecified trimester: Secondary | ICD-10-CM | POA: Diagnosis present

## 2021-11-14 DIAGNOSIS — O24439 Gestational diabetes mellitus in the puerperium, unspecified control: Principal | ICD-10-CM | POA: Diagnosis present

## 2021-11-14 DIAGNOSIS — Z8751 Personal history of pre-term labor: Secondary | ICD-10-CM | POA: Diagnosis present

## 2021-11-14 DIAGNOSIS — O24420 Gestational diabetes mellitus in childbirth, diet controlled: Secondary | ICD-10-CM | POA: Diagnosis not present

## 2021-11-14 DIAGNOSIS — O24419 Gestational diabetes mellitus in pregnancy, unspecified control: Secondary | ICD-10-CM | POA: Diagnosis present

## 2021-11-14 LAB — TYPE AND SCREEN
ABO/RH(D): O POS
Antibody Screen: NEGATIVE

## 2021-11-14 LAB — CBC
HCT: 29.1 % — ABNORMAL LOW (ref 36.0–46.0)
Hemoglobin: 9.7 g/dL — ABNORMAL LOW (ref 12.0–15.0)
MCH: 27.8 pg (ref 26.0–34.0)
MCHC: 33.3 g/dL (ref 30.0–36.0)
MCV: 83.4 fL (ref 80.0–100.0)
Platelets: 173 10*3/uL (ref 150–400)
RBC: 3.49 MIL/uL — ABNORMAL LOW (ref 3.87–5.11)
RDW: 15.1 % (ref 11.5–15.5)
WBC: 14.5 10*3/uL — ABNORMAL HIGH (ref 4.0–10.5)
nRBC: 0 % (ref 0.0–0.2)

## 2021-11-14 LAB — GC/CHLAMYDIA PROBE AMP (~~LOC~~) NOT AT ARMC
Chlamydia: NEGATIVE
Comment: NEGATIVE
Comment: NORMAL
Neisseria Gonorrhea: NEGATIVE

## 2021-11-14 LAB — OB RESULTS CONSOLE GBS: GBS: NEGATIVE

## 2021-11-14 LAB — HEPATITIS B SURFACE ANTIGEN: Hepatitis B Surface Ag: NONREACTIVE

## 2021-11-14 LAB — RPR: RPR Ser Ql: NONREACTIVE

## 2021-11-14 MED ORDER — OXYCODONE-ACETAMINOPHEN 5-325 MG PO TABS: 2.0000 | ORAL_TABLET | ORAL | Status: DC | PRN

## 2021-11-14 MED ORDER — SENNOSIDES-DOCUSATE SODIUM 8.6-50 MG PO TABS
2.0000 | ORAL_TABLET | ORAL | Status: DC
  Administered 2021-11-14 – 2021-11-15 (×2): 2 via ORAL
  Filled 2021-11-14 (×2): qty 2

## 2021-11-14 MED ORDER — BENZOCAINE-MENTHOL 20-0.5 % EX AERO: 1.0000 | INHALATION_SPRAY | CUTANEOUS | Status: DC | PRN

## 2021-11-14 MED ORDER — WITCH HAZEL-GLYCERIN EX PADS: 1.0000 | MEDICATED_PAD | CUTANEOUS | Status: DC | PRN

## 2021-11-14 MED ORDER — SODIUM CHLORIDE 0.9 % IV SOLN: INTRAVENOUS | Status: DC | PRN

## 2021-11-14 MED ORDER — MEASLES, MUMPS & RUBELLA VAC IJ SOLR
0.5000 mL | Freq: Once | INTRAMUSCULAR | Status: AC
  Administered 2021-11-15: 0.5 mL via SUBCUTANEOUS
  Filled 2021-11-14: qty 0.5

## 2021-11-14 MED ORDER — OXYTOCIN-SODIUM CHLORIDE 30-0.9 UT/500ML-% IV SOLN: 2.5000 [IU]/h | INTRAVENOUS | Status: DC

## 2021-11-14 MED ORDER — ACETAMINOPHEN 325 MG PO TABS: 650.0000 mg | ORAL_TABLET | ORAL | Status: DC | PRN

## 2021-11-14 MED ORDER — TETANUS-DIPHTH-ACELL PERTUSSIS 5-2.5-18.5 LF-MCG/0.5 IM SUSY
0.5000 mL | PREFILLED_SYRINGE | Freq: Once | INTRAMUSCULAR | Status: AC
  Administered 2021-11-15: 0.5 mL via INTRAMUSCULAR
  Filled 2021-11-14: qty 0.5

## 2021-11-14 MED ORDER — SIMETHICONE 80 MG PO CHEW: 80.0000 mg | CHEWABLE_TABLET | ORAL | Status: DC | PRN

## 2021-11-14 MED ORDER — OXYTOCIN-SODIUM CHLORIDE 30-0.9 UT/500ML-% IV SOLN
INTRAVENOUS | Status: AC
  Filled 2021-11-14: qty 500

## 2021-11-14 MED ORDER — SODIUM CHLORIDE 0.9% FLUSH
3.0000 mL | Freq: Two times a day (BID) | INTRAVENOUS | Status: DC
  Administered 2021-11-14 (×2): 3 mL via INTRAVENOUS

## 2021-11-14 MED ORDER — PRENATAL MULTIVITAMIN CH
1.0000 | ORAL_TABLET | Freq: Every day | ORAL | Status: DC
  Filled 2021-11-14: qty 1

## 2021-11-14 MED ORDER — IBUPROFEN 600 MG PO TABS
600.0000 mg | ORAL_TABLET | Freq: Four times a day (QID) | ORAL | Status: DC
  Administered 2021-11-14 – 2021-11-15 (×5): 600 mg via ORAL
  Filled 2021-11-14 (×6): qty 1

## 2021-11-14 MED ORDER — ONDANSETRON HCL 4 MG/2ML IJ SOLN: 4.0000 mg | Freq: Four times a day (QID) | INTRAMUSCULAR | Status: DC | PRN

## 2021-11-14 MED ORDER — DIPHENHYDRAMINE HCL 25 MG PO CAPS: 25.0000 mg | ORAL_CAPSULE | Freq: Four times a day (QID) | ORAL | Status: DC | PRN

## 2021-11-14 MED ORDER — SOD CITRATE-CITRIC ACID 500-334 MG/5ML PO SOLN: 30.0000 mL | ORAL | Status: DC | PRN

## 2021-11-14 MED ORDER — OXYCODONE-ACETAMINOPHEN 5-325 MG PO TABS: 1.0000 | ORAL_TABLET | ORAL | Status: DC | PRN

## 2021-11-14 MED ORDER — OXYCODONE HCL 5 MG PO TABS: 5.0000 mg | ORAL_TABLET | ORAL | Status: DC | PRN

## 2021-11-14 MED ORDER — ONDANSETRON HCL 4 MG PO TABS: 4.0000 mg | ORAL_TABLET | ORAL | Status: DC | PRN

## 2021-11-14 MED ORDER — COCONUT OIL OIL: 1.0000 | TOPICAL_OIL | Status: DC | PRN

## 2021-11-14 MED ORDER — OXYTOCIN BOLUS FROM INFUSION
333.0000 mL | Freq: Once | INTRAVENOUS | Status: AC
  Administered 2021-11-14: 333 mL via INTRAVENOUS

## 2021-11-14 MED ORDER — ZOLPIDEM TARTRATE 5 MG PO TABS: 5.0000 mg | ORAL_TABLET | Freq: Every evening | ORAL | Status: DC | PRN

## 2021-11-14 MED ORDER — LIDOCAINE HCL (PF) 1 % IJ SOLN: 30.0000 mL | INTRAMUSCULAR | Status: DC | PRN

## 2021-11-14 MED ORDER — LACTATED RINGERS IV SOLN: 500.0000 mL | INTRAVENOUS | Status: DC | PRN

## 2021-11-14 MED ORDER — SODIUM CHLORIDE 0.9% FLUSH: 3.0000 mL | INTRAVENOUS | Status: DC | PRN

## 2021-11-14 MED ORDER — OXYCODONE HCL 5 MG PO TABS: 10.0000 mg | ORAL_TABLET | ORAL | Status: DC | PRN

## 2021-11-14 MED ORDER — DIBUCAINE (PERIANAL) 1 % EX OINT: 1.0000 | TOPICAL_OINTMENT | CUTANEOUS | Status: DC | PRN

## 2021-11-14 MED ORDER — LACTATED RINGERS IV SOLN: INTRAVENOUS | Status: DC

## 2021-11-14 MED ORDER — FLEET ENEMA 7-19 GM/118ML RE ENEM: 1.0000 | ENEMA | RECTAL | Status: DC | PRN

## 2021-11-14 MED ORDER — ONDANSETRON HCL 4 MG/2ML IJ SOLN: 4.0000 mg | INTRAMUSCULAR | Status: DC | PRN

## 2021-11-14 NOTE — Progress Notes (Signed)
Patient screened out for psychosocial assessment since none of the following apply: °Psychosocial stressors documented in mother or baby's chart °Gestation less than 32 weeks °Code at delivery  °Infant with anomalies °Please contact the Clinical Social Worker if specific needs arise, by [name]'s request, or if [name] scores greater than 9/yes to question 10 on Edinburgh Postpartum Depression Screen. ° °Everard Interrante Boyd-Gilyard, MSW, LCSW °Clinical Social Work °[phone] °  °

## 2021-11-14 NOTE — Lactation Note (Signed)
This note was copied from a baby's chart.  NICU Lactation Consultation Note  Patient Name: Jamie Lutz Today's Date: 11/14/2021 Age:27 hours   Subjective Reason for consult: Initial assessment LC returned and set up pump, provided pump education, and stork pump form. Pending MD signature.   Objective Infant data: Mother's Current Feeding Choice: Breast Milk and Donor Milk  Maternal data: [id number]  Vaginal, Spontaneous  Does the patient have breastfeeding experience prior to this delivery?: Yes How long did the patient breastfeed?: 1 year with other children   Intervention/Plan Interventions: Education; [employer] brochure  Tools: Pump Pump Education: Setup, frequency, and cleaning; Milk Storage  Plan: Consult Status: NICU follow-up  NICU Follow-up type: New admission follow up  Pt to pump q3h [name] will fax stork form when complete  [name] 11/14/2021, 4:10 PM

## 2021-11-14 NOTE — H&P (Signed)
OBSTETRIC ADMISSION HISTORY AND PHYSICAL  Jamie Lutz is a 26 y.o. female [id number] at 72w4dpresenting via EMS following svd that occurred at home at 3.09am today. EMS delivered placenta at about 0315, en route the hospital. She had been seen earlier in MAU for preterm contractions, had some tocolysis and discharged home after no cervical change for several hours. She reports ROM about 20 mins prior to delivery at home. She plans on breast and bottle feeding. She declines birth control.  [facility]  Dating: By 12 week [place]--->  Estimated Date of Delivery: 12/29/21  Sono:    '@[redacted]w[redacted]d' , CWD, normal anatomy, vertex presentation, 351g,    Prenatal History/Complications:  Patient Active Problem List   Diagnosis Date Noted   Preterm delivery 11/14/2021   Preterm uterine contractions 11/13/2021   Gestational diabetes 10/28/2021   Asymptomatic bacteriuria during pregnancy in second trimester 08/19/2021   Supervision of high-risk pregnancy 07/24/2021   Depression with anxiety 06/05/2016    Past Medical History: Past Medical History:  Diagnosis Date   Anxiety    Depression    Gestational diabetes     Past Surgical History: Past Surgical History:  Procedure Laterality Date   PERINEAL LACERATION REPAIR  04/17/2011   Procedure: SUTURE REPAIR PERINEAL LACERATION;  Surgeon: JJonnie Kind MD;  [facility];  Service: Gynecology;  Laterality: N/A;    Obstetrical History: OB History     Gravida  5   Para  5   Term  4   Preterm  1   AB  0   Living  5      SAB  0   IAB  0   Ectopic  0   Multiple  0   Live Births  5           Social History Social History   Socioeconomic History   Marital status: Married    Spouse name: JShea Stakes  Number of children: 3   Years of education: Not on file   Highest education level: High school graduate  Occupational History   Not on file  Tobacco Use   Smoking status: Never    Smokeless tobacco: Never  Vaping Use   Vaping Use: Never used  Substance and Sexual Activity   Alcohol use: No    Alcohol/week: 0.0 standard drinks of alcohol   Drug use: No   Sexual activity: Yes    Birth control/protection: None  Other Topics Concern   Not on file  Social History Narrative   Not on file   Social Determinants of Health   Financial Resource Strain: Medium Risk (10/30/2017)   Overall Financial Resource Strain (CARDIA)    Difficulty of Paying Living Expenses: Somewhat hard  Food Insecurity: No Food Insecurity (11/14/2021)   Hunger Vital Sign    Worried About Running Out of Food in the Last Year: Never true    Ran Out of Food in the Last Year: Never true  Transportation Needs: No Transportation Needs (11/14/2021)   PRAPARE - THydrologist(Medical): No    Lack of Transportation (Non-Medical): No  Physical Activity: Insufficiently Active (10/30/2017)   Exercise Vital Sign    Days of Exercise per Week: 2 days    Minutes of Exercise per Session: 30 min  Stress: Stress Concern Present (10/30/2017)   FEden   Feeling of Stress : Rather  much  Social Connections: Moderately Integrated (10/30/2017)   Social Connection and Isolation Panel [NHANES]    Frequency of Communication with Friends and Family: More than three times a week    Frequency of Social Gatherings with Friends and Family: More than three times a week    Attends Religious Services: More than 4 times per year    Active Member of [employer] or Organizations: No    Attends Music therapist: Never    Marital Status: Married    Family History: Family History  Problem Relation Age of Onset   Hypothyroidism Mother    Diabetes Maternal Grandmother    Cancer Maternal Grandfather        esophagus    Allergies: No Known Allergies  Medications Prior to Admission  Medication Sig Dispense Refill Last  Dose   Accu-Chek Softclix Lancets lancets Use as instructed to check blood sugar 4 times daily 100 each 12    Blood Glucose Monitoring Suppl (ACCU-CHEK GUIDE ME) w/Device KIT 1 each by Does not apply route 4 (four) times daily. 1 kit 0    glucose blood (ACCU-CHEK GUIDE) test strip Use as instructed to check blood sugar 4 times daily 50 each 12    NIFEdipine (PROCARDIA) 10 MG capsule Take 1 capsule (10 mg total) by mouth 3 (three) times daily. 20 capsule 1    oxyCODONE-acetaminophen (PERCOCET/ROXICET) 5-325 MG tablet Take 1 tablet by mouth every 6 (six) hours as needed for moderate pain. 6 tablet 0    Prenatal Vit-Fe Fumarate-FA (PRENATAL PO) Take by mouth.        Review of Systems   All systems reviewed and negative except as stated in HPI  Blood pressure (!) 98/58, pulse 85, temperature 98 F (36.7 C), temperature source Oral, resp. rate 16, last menstrual period 04/22/2021, unknown if currently breastfeeding. General appearance: alert, cooperative, and appears stated age Lungs: clear to auscultation bilaterally Heart: regular rate and rhythm Abdomen: soft, fundus firm Pelvic: no perineal lacerations noted; bleeding well controlled Extremities: Homans sign is negative, no sign of DVT  Baby delivered by the time of arrival.     Prenatal labs: ABO, Rh: --/--/PENDING (10/26 9485) Antibody: PENDING (10/26 0518) Rubella:  no results available RPR: Non Reactive (09/29 0833)  HBsAg:   no results available. reordered HIV: Non Reactive (09/29 0833)  GBS:   unknown. Collected today 2 hr Glucola abnormal Genetic screening - declined Anatomy US normal  Prenatal Transfer Tool  Maternal Diabetes: Yes:  Diabetes Type:  Diet controlled Genetic Screening: Declined Maternal Ultrasounds/Referrals: Normal Fetal Ultrasounds or other Referrals:  None Maternal Substance Abuse:  No Significant Maternal Medications:  None Significant Maternal Lab Results:  None Number of Prenatal  Visits:greater than 3 verified prenatal visits Other Comments:  None  Results for orders placed or performed during the hospital encounter of 11/14/21 (from the past 24 hour(s))  Type and screen [place]   Collection Time: 11/14/21  5:18 AM  Result Value Ref Range   ABO/RH(D) PENDING    Antibody Screen PENDING    Sample Expiration      11/17/2021,2359 Performed at [facility], [address], [place],  [address]   CBC   Collection Time: 11/14/21  5:20 AM  Result Value Ref Range   WBC 14.5 (H) 4.0 - 10.5 K/uL   RBC 3.49 (L) 3.87 - 5.11 MIL/uL   Hemoglobin 9.7 (L) 12.0 - 15.0 g/dL   HCT 29.1 (L) 36.0 - 46.0 %  MCV 83.4 80.0 - 100.0 fL   MCH 27.8 26.0 - 34.0 pg   MCHC 33.3 30.0 - 36.0 g/dL   RDW 15.1 11.5 - 15.5 %   Platelets 173 150 - 400 K/uL   nRBC 0.0 0.0 - 0.2 %  Results for orders placed or performed during the hospital encounter of 11/13/21 (from the past 24 hour(s))  Urinalysis, Routine w reflex microscopic Urine, Clean Catch   Collection Time: 11/13/21  6:39 PM  Result Value Ref Range   Color, Urine YELLOW YELLOW   APPearance HAZY (A) CLEAR   Specific Gravity, Urine 1.015 1.005 - 1.030   pH 7.0 5.0 - 8.0   Glucose, UA NEGATIVE NEGATIVE mg/dL   Hgb urine dipstick NEGATIVE NEGATIVE   Bilirubin Urine NEGATIVE NEGATIVE   Ketones, ur NEGATIVE NEGATIVE mg/dL   Protein, ur NEGATIVE NEGATIVE mg/dL   Nitrite NEGATIVE NEGATIVE   Leukocytes,Ua SMALL (A) NEGATIVE   RBC / HPF 0-5 0 - 5 RBC/hpf   WBC, UA 0-5 0 - 5 WBC/hpf   Bacteria, UA RARE (A) NONE SEEN   Squamous Epithelial / LPF 0-5 0 - 5   Mucus PRESENT    Ca Oxalate Crys, UA PRESENT     Patient Active Problem List   Diagnosis Date Noted   Preterm delivery 11/14/2021   Preterm uterine contractions 11/13/2021   Gestational diabetes 10/28/2021   Asymptomatic bacteriuria during pregnancy in second trimester 08/19/2021   Supervision of high-risk pregnancy 07/24/2021   Depression with  anxiety 06/05/2016   History of gestational diabetes 02/25/2016   Adjustment disorder with anxious mood 11/03/2014   Myofascial abdominal wall pain 05/03/2014   Chronic abdominal pain 05/03/2014   Constipation 05/03/2014   Paresthesia 02/06/2014    Assessment/Plan:  Quyen Cutsforth is a 26 y.o. [id number] at 64w4dhere for post partum management after precipitous preterm delivery at home. She is stable on arrival. Slightly low blood pressures enroute the hospital, got 500cc IV N/S per EMS. Repeated 1 litre bolus of LR on arrival.  Blood pressures are good. Bleeding well controlled. No lacerations noted. Baby to NICU due to prematurity. NICU doctor came by to discuss with patient what plan of care is  Continue routine immediate post partum recovery.  #ID:  GBS unknown, collected. GC/Chlamydia and wet prep collected given preterm labor #MOF: breast and bottle #MOC:declined #Circ:  N/a  CLiliane ChannelMD MPH OB Fellow, FFort Worthfor WB[place]10/26/2023

## 2021-11-14 NOTE — Lactation Note (Signed)
This note was copied from a baby's chart.  NICU Lactation Consultation Note  Patient Name: Girl Taylen Wendland Today's Date: 11/14/2021 Age:28 hours   Subjective Reason for consult: Initial assessment; NICU baby; Preterm <34wks LC provided brief introduction, IDF overview, and pumping recommendations . RN looking for a pump and will have [facility] paged to set up/ provide pumping ed after parents visit infant in NICU.  Pt requests stork pump. LC will pick up signed form and fax after MD signs.   Objective Infant data: Mother's Current Feeding Choice: Breast Milk and Donor Milk  Maternal data: [id number]  Vaginal, Spontaneous  Does the patient have breastfeeding experience prior to this delivery?: Yes How long did the patient breastfeed?: 1 year with other children     Intervention/Plan Interventions: Education [facility] services brochure Stork Rx form  Plan: Consult Status: NICU follow-up  NICU Follow-up type: New admission follow up  [place] to return and complete pumping ed Pt to pump q3h and bring any EBM to NICU  [name] 11/14/2021, 9:38 AM

## 2021-11-15 ENCOUNTER — Other Ambulatory Visit (HOSPITAL_COMMUNITY): Payer: Self-pay

## 2021-11-15 LAB — SURGICAL PATHOLOGY

## 2021-11-15 MED ORDER — SENNOSIDES-DOCUSATE SODIUM 8.6-50 MG PO TABS
2.0000 | ORAL_TABLET | Freq: Every evening | ORAL | 2 refills | Status: DC | PRN
  Filled 2021-11-15: qty 30, 15d supply, fill #0

## 2021-11-15 MED ORDER — IBUPROFEN 600 MG PO TABS
600.0000 mg | ORAL_TABLET | Freq: Four times a day (QID) | ORAL | 0 refills | Status: DC | PRN
  Filled 2021-11-15: qty 60, 15d supply, fill #0

## 2021-11-15 MED ORDER — ACETAMINOPHEN 500 MG PO TABS
1000.0000 mg | ORAL_TABLET | Freq: Four times a day (QID) | ORAL | 0 refills | Status: AC | PRN
  Filled 2021-11-15: qty 60, 8d supply, fill #0

## 2021-11-15 NOTE — Discharge Summary (Signed)
Postpartum Discharge Summary     Patient Name: Jamie Lutz DOB: 07-Nov-1995 MRN: [id number]  Date of admission: 11/14/2021 Delivery date:11/14/2021  Delivering provider:   Date of discharge: 11/15/2021  Admitting diagnosis: Preterm delivery [O60.10X0] Intrauterine pregnancy: [redacted]w[redacted]d    Secondary diagnosis:  Principal Problem:   Preterm delivery Active Problems:   History of gestational diabetes   Supervision of high-risk pregnancy   Gestational diabetes   Preterm uterine contractions  Additional problems: None   Discharge diagnosis: Preterm Pregnancy Delivered                                              Post partum procedures: None Augmentation:  None Complications: None  Hospital course: Onset of Labor With Vaginal Delivery      26y.o. yo [id number] admitted  after having a vaginal delivery at home at [facility]0/25/23.   Delivery Method:Vaginal, Spontaneous  Episiotomy: None  Lacerations:  None  Patient had an uncomplicated postpartum course,  She is ambulating, tolerating a regular diet, passing flatus, and urinating well. Patient is discharged home in stable condition on 11/15/21.  Newborn Data: Birth date:11/14/2021  Birth time:3:09 AM  Gender:Female  Living status:Living  Apgars: ,  Weight:2230 g   Magnesium Sulfate received: No BMZ received: No Rhophylac:No MMR:No T-DaP:Given postpartum Flu: No Transfusion:No  Physical exam  Vitals:   11/14/21 1644 11/14/21 2002 11/15/21 0359 11/15/21 0823  BP: (!) 89/53 (!) 93/56 (!) 94/52 94/64  Pulse: 88 91 73 84  Resp: _0 Temp: 97.8 F (36.6 C) 97.8 F (36.6 C) 97.6 F (36.4 C) 97.7 F (36.5 C)  TempSrc: Oral Oral Oral Oral  SpO2: 99% 99% 98% 100%   General: alert, cooperative, and no distress Lochia: appropriate Uterine Fundus: firm Incision: N/A DVT Evaluation: No evidence of DVT seen on physical exam. Negative Homan's sign. No cords or calf tenderness. No significant  calf/ankle edema. Labs: Lab Results  Component Value Date   WBC 14.5 (H) 11/14/2021   HGB 9.7 (L) 11/14/2021   HCT 29.1 (L) 11/14/2021   MCV 83.4 11/14/2021   PLT 173 11/14/2021      Latest Ref Rng & Units 05/21/2019    6:56 AM  CMP  Glucose 70 - 99 mg/dL 104   BUN 6 - 20 mg/dL 10   Creatinine 0.44 - 1.00 mg/dL 0.63   Sodium 135 - 145 mmol/L 138   Potassium 3.5 - 5.1 mmol/L 3.7   Chloride 98 - 111 mmol/L 104   CO2 22 - 32 mmol/L 24   Calcium 8.9 - 10.3 mg/dL 9.1   Total Protein 6.5 - 8.1 g/dL 7.4   Total Bilirubin 0.3 - 1.2 mg/dL 0.5   Alkaline Phos 38 - 126 U/L 66   AST 15 - 41 U/L 18   ALT 0 - 44 U/L 15    Edinburgh Score:    11/14/2021   12:00 PM  Edinburgh Postnatal Depression Scale Screening Tool  I have been able to laugh and see the funny side of things. 0  I have looked forward with enjoyment to things. 0  I have blamed myself unnecessarily when things went wrong. 0  I have been anxious or worried for no good reason. 0  I have felt scared or panicky for no good reason. 0  Things have been  getting on top of me. 1  I have been so unhappy that I have had difficulty sleeping. 0  I have felt sad or miserable. 1  I have been so unhappy that I have been crying. 1  The thought of harming myself has occurred to me. 0  Edinburgh Postnatal Depression Scale Total 3      After visit meds:  Allergies as of 11/15/2021   No Known Allergies      Medication List     STOP taking these medications    Accu-Chek Guide Me w/Device Kit   Accu-Chek Guide test strip Generic drug: glucose blood   Accu-Chek Softclix Lancets lancets   NIFEdipine 10 MG capsule Commonly known as: Procardia   oxyCODONE-acetaminophen 5-325 MG tablet Commonly known as: PERCOCET/ROXICET       TAKE these medications    acetaminophen 500 MG tablet Commonly known as: TYLENOL Take 2 tablets (1,000 mg total) by mouth every 6 (six) hours as needed for mild pain or moderate pain (for pain  scale < 4).   ibuprofen 600 MG tablet Commonly known as: ADVIL Take 1 tablet (600 mg total) by mouth every 6 (six) hours as needed for moderate pain or cramping.   PRENATAL PO Take by mouth.   senna-docusate 8.6-50 MG tablet Commonly known as: Senokot-S Take 2 tablets by mouth at bedtime as needed for mild constipation or moderate constipation.        Discharge home in stable condition Infant Feeding: Bottle and Breast Infant Disposition:NICU Discharge instruction: per After Visit Summary and Postpartum booklet. Activity: Advance as tolerated. Pelvic rest for 6 weeks.  Diet: routine diet Anticipated Birth Control: Unsure Postpartum Appointment:4 weeks Additional Postpartum F/U: 2 hour GTT     11/15/2021 Verita Schneiders, MD

## 2021-11-15 NOTE — Lactation Note (Signed)
This note was copied from a baby's chart.  NICU Lactation Consultation Note  Patient Name: Jamie Lutz KGURK'Y Date: 11/15/2021 Age:28 hours   Subjective Reason for consult: Follow-up assessment Pt pumped 2x in first 24-hours. She is awaiting MD's signature on stork pump rx. Pt denies pain or difficulty with pump use.   Objective Infant data: Mother's Current Feeding Choice: Breast Milk and Donor Milk   Maternal data: [id number]  Vaginal, Spontaneous  Does the patient have breastfeeding experience prior to this delivery?: Yes How long did the patient breastfeed?: 1 year with other children  Pumping frequency: 2 x in 1st 24-hours   Intervention/Plan Interventions: Education; [employer] brochure  Tools: Pump Pump Education: Setup, frequency, and cleaning; Milk Storage  Plan: Consult Status: NICU follow-up  NICU Follow-up type: Maternal D/C visit; Verify onset of copious milk; Verify absence of engorgement  LC will plan return visit to retrieve signed stork pump rx and fax to [facility].   [name] 11/15/2021, 8:58 AM

## 2021-11-16 LAB — CULTURE, BETA STREP (GROUP B ONLY)

## 2021-11-19 ENCOUNTER — Ambulatory Visit: Payer: Self-pay

## 2021-11-19 NOTE — Lactation Note (Signed)
This note was copied from a baby's chart.  NICU Lactation Consultation Note  Patient Name: Girl Jolette Lana FTDDU'K Date: 11/19/2021 Age:26 years   Subjective Reason for consult: Follow-up assessment Mother continues to pump often and without difficulty. She received a Medela pump for at-home use.   Objective Infant data: Mother's Current Feeding Choice: Breast Milk  Infant feeding assessment Scale for Readiness: 3    Maternal data: [id number]  Vaginal, Spontaneous  Pumping frequency: q3h Pumped volume: 60 mL  Pump: DEBP, Personal  Assessment Maternal: Milk volume: Normal   Intervention/Plan Interventions: Education  Plan: Consult Status: NICU follow-up  NICU Follow-up type: Weekly NICU follow up  Continue to pump q3h and bring EBM to NICU.  [name] 11/19/2021, 1:02 PM

## 2021-11-21 ENCOUNTER — Ambulatory Visit: Payer: Self-pay

## 2021-11-21 ENCOUNTER — Encounter: Payer: 59 | Admitting: Obstetrics & Gynecology

## 2021-11-21 NOTE — Lactation Note (Signed)
This note was copied from a baby's chart.  NICU Lactation Consultation Note  Patient Name: Jamie Lutz NKNLZ'J Date: 11/21/2021 Age:28 days  Subjective Reason for consult: Follow-up assessment; NICU baby; Late-preterm 34-36.6wks; RN request; Infant < 6lbs  Visited with family of 54 81/36 weeks old (adjusted) NICU female, [name] is a P5 and experienced breastfeeding. NICU RN Belenda Cruise requested [facility] assistance for the 5 pm feeding, but baby was deeply asleep and did not wake up. Scheduled another feeding assist for tomorrow at 2 pm, [name] aware she should pump prior feedings/attempts at the breast. Reviewed lactogenesis III, pumping schedule and IDF 1/2. Ms. Milinda Hirschfeld voiced that she's been pumping for 10-15 minutes, advised to try pumping for 15-20 minutes or until her breasts are completely empty now that her milk is in.  Objective Infant data: Mother's Current Feeding Choice: Breast Milk  Infant feeding assessment Scale for Readiness: 2 Scale for Quality: 4  Maternal data: [id number]  Vaginal, Spontaneous Current breast feeding challenges:: NICU admission How long did the patient breastfeed?: Didn't breastfeed her first baby but she did with # 2, 3, 4 for over a year Pumping frequency: 6-7 times/24 hours Pumped volume: 45 mL Risk factor for low milk supply:: prematurity, infant separation Pump: DEBP, Personal  Assessment Infant: In NICU  Maternal: Milk volume: Normal  Intervention/Plan Interventions: Breast feeding basics reviewed; DEBP; Education; Infant Driven Feeding Algorithm education Tools: Pump Pump Education: Setup, frequency, and cleaning; Milk Storage  Plan of care: Encouraged pumping every 3 hours x 15-20 minutes, ideally 8 pumping sessions/24 hours  She'll start taking baby to an empty breast on feeding cues around feeding times  No other support person at this time. All questions and concerns answered, family to contact [facility] services  PRN.  Consult Status: NICU follow-up  NICU Follow-up type: Assist with IDF-1 (Mother to pre-pump before breastfeeding)   Lenise Herald 11/21/2021, 4:43 PM

## 2021-11-22 ENCOUNTER — Ambulatory Visit: Payer: Self-pay

## 2021-11-22 NOTE — Lactation Note (Addendum)
This note was copied from a baby's chart.  NICU Lactation Consultation Note  Patient Name: Jamie Lutz XVEZB'M Date: 11/22/2021 Age:28 days  Subjective Reason for consult: Follow-up assessment; NICU baby; Late-preterm 34-36.6wks; Infant < 6lbs; Breastfeeding assistance  Visited with family of 62 days old LPI NICU female, Jamie Lutz is a P5 and experienced breastfeeding. This LC came to assist with the 2 pm feeding but baby did not fully wake up, she was very sleepy (see LATCH score). Jamie Lutz voiced that she took baby to breast for the 11 am feeding and she latched for about 5 minutes. She continues to pump consistently and volumes remain WNL. Noticed that she's been using her Medela personal pump instead of the hospital grade one, she said that she didn't have all the pieces, a new kit was provided.  Objective Infant data: Mother's Current Feeding Choice: Breast Milk  Infant feeding assessment Scale for Readiness: 3  Maternal data: [id number]  Vaginal, Spontaneous No data recorded Current breast feeding challenges:: NICU admission How long did the patient breastfeed?: Didn't breastfeed her first baby but she did with # 2, 3, 4 for over a year Pumping frequency: 6-7 times/24 hours Pumped volume: 45 mL Risk factor for low milk supply:: prematurity, infant separation Pump: DEBP, Personal  Assessment Infant: LATCH Score: 5  Maternal: Milk volume: Normal  Intervention/Plan Interventions: Breast feeding basics reviewed; Assisted with latch; Hand express; Pre-pump if needed; Support pillows; DEBP; Education; "The NICU and Your Baby" book; Infant Driven Feeding Algorithm education Tools: Pump Pump Education: Setup, frequency, and cleaning; Milk Storage  Plan of care: Encouraged pumping every 3 hours x 15-20 minutes, ideally 8 pumping sessions/24 hours  She'll continue taking baby to an empty breast on feeding cues around feeding times LC to bring NS # 20 for  the next feeding assist.    No other support person at this time. All questions and concerns answered, family to contact [facility] services PRN.  Consult Status: NICU follow-up  NICU Follow-up type: Assist with IDF-1 (Mother to pre-pump before breastfeeding); Weekly NICU follow up   [place] 11/22/2021, 3:42 PM

## 2021-11-25 ENCOUNTER — Telehealth (HOSPITAL_COMMUNITY): Payer: Self-pay | Admitting: *Deleted

## 2021-11-25 NOTE — Telephone Encounter (Signed)
Mom reports feeling good. No concerns about herself at this time. EPDS=3 [facility] score=3) Mom reports baby is doing well. Feeding, peeing, and pooping without difficulty. Safe sleep reviewed. Mom reports no concerns about baby at present.  Odis Hollingshead, RN 11-25-2021 at 4:20pm

## 2021-11-26 ENCOUNTER — Ambulatory Visit: Payer: Self-pay

## 2021-11-26 NOTE — Lactation Note (Signed)
This note was copied from a baby's chart. Lactation Consultation Note  Patient Name: Jamie Lutz YGEFU'W Date: 11/26/2021 Reason for consult: Follow-up assessment Age:28 days  [name] requested LC because she had a question regarding the milk she pumped this morning. She voiced she pumped at 6:50 am and left her breastmilk at room temperature for 5 hours. Revised breastmilk storage guidelines and instructed her to discard 6:50 am pumping session. She continues putting baby to breast and will call again for assistance PRN.  Feeding Mother's Current Feeding Choice: Breast Milk  Consult Status Consult Status: NICU follow-up Date: 11/26/21 Follow-up type: In-patient   Glinda Natzke Francene Boyers 11/26/2021, 11:46 AM

## 2021-11-26 NOTE — Lactation Note (Signed)
This note was copied from a baby's chart. Lactation Consultation Note  Patient Name: Jamie Lutz Today's Date: 11/26/2021   Age:28 days  Visited with Jamie Lutz again due to SLP request. Night shift RN reported that mom was taking baby off the breast at the 5 minutes mark because "she was getting full". Spoke to Jamie Lutz about baby's feeding pattern and she voiced that the reason why she's been taking baby off the breast is because she was experiencing a strong let down (even with pumping prior feedings at the breast) and milk was falling off baby's mouth, so she had to stop. Discussed safe feeding practices and encouraged mom to continue doing so if baby cannot handle let know. NICU LC to follow up tomorrow to assist with the latch; this dyad may benefit from the use of a NS to pace flow at the breast. Continue current plan of care.  Ltanya Bayley S Baylin Gamblin 11/26/2021, 5:23 PM

## 2021-11-27 ENCOUNTER — Ambulatory Visit: Payer: Self-pay

## 2021-11-27 NOTE — Lactation Note (Signed)
This note was copied from a baby's chart.  NICU Lactation Consultation Note  Patient Name: Jamie Lutz Today's Date: 11/27/2021 Age:26 years   Subjective Reason for consult: Breastfeeding assistance Mother pre-pumped. Infant did not wake for feeding. Reviewed normalcy of behavior and IDF algorithm.  Rescheduled for 1100 on 11-28-2021.  Objective Infant data: Mother's Current Feeding Choice: Breast Milk  Infant feeding assessment Scale for Readiness: 3   Maternal data: [id number]  Vaginal, Spontaneous Pumping frequency: q3h with 120+ yield Pumped volume: 120 mL  Pump: DEBP, Personal  Assessment Infant: LATCH Score: 6   Maternal: Milk volume: Normal   Intervention/Plan Interventions: Education; Infant Driven Feeding Algorithm education  Plan: Consult Status: NICU follow-up  NICU Follow-up type: Assist with IDF-1 (Mother to pre-pump before breastfeeding)  Rechallenge at breast with LC present tomorrow at 1100.  Elder Negus 11/27/2021, 11:30 AM

## 2021-11-28 ENCOUNTER — Ambulatory Visit: Payer: Self-pay

## 2021-11-28 NOTE — Lactation Note (Signed)
This note was copied from a baby's chart.  NICU Lactation Consultation Note  Patient Name: Jamie Lutz ZOXWR'U Date: 11/28/2021 Age:28 wk.o.   Subjective Reason for consult: Follow-up assessment; NICU baby; Preterm <34wks; Infant < 6lbs  Lactation followed up with parent and infant to assist with breastfeeding. We placed infant on parent's chest. I did not observe any feeding cues. Parent hand expressed breast milk and rubbed onto baby's lips to stimulate suck reflex. Infant continued to sleep.  We reviewed basics of feeding cues, signs of feeding readiness and how to position infant for latch. I encouraged parent to continue to pump q3 hours to protect milk volume.  Objective Infant data: Mother's Current Feeding Choice: Breast Milk  Infant feeding assessment Scale for Readiness: 2 Scale for Quality: 3  Maternal data: [id number]  Vaginal, Spontaneous Current breast feeding challenges:: NICU  Does the patient have breastfeeding experience prior to this delivery?: Yes  Pumping frequency: q3 hours Pumped volume: 120 mL   Pump: Personal  Assessment Infant: LATCH Score: 5   Maternal: Milk volume: Normal   Intervention/Plan Interventions: Breast feeding basics reviewed; Education; Hand express  Tools: Pump Pump Education: Setup, frequency, and cleaning  Plan: Consult Status: NICU follow-up  NICU Follow-up type: Weekly NICU follow up    Walker Shadow 11/28/2021, 11:24 AM

## 2021-12-03 ENCOUNTER — Ambulatory Visit: Payer: Self-pay

## 2021-12-03 NOTE — Lactation Note (Signed)
This note was copied from a baby's chart.  NICU Lactation Consultation Note  Patient Name: Jamie Lutz DUKRC'V Date: 12/03/2021 Age:28 wk.o.  Subjective Reason for consult: Follow-up assessment; NICU baby; Late-preterm 34-36.6wks; Infant < 6lbs; Breastfeeding assistance  Visited with family for the 11 am feeding, but Jamie Lutz was already feeding baby a bottle, she voiced that baby woke up early. Offered assistance with latch for the next feeding since this baby is on IDF 2 but she politely declined; she said that baby had a hard time dealing with her strong let down yesterday afternoon (she had a stridor) and that she prefers working on bottle feedings for now. Asked her to call for assistance when needed. SLP Jamie Lutz brought a NS # 20 in the room to help with her strong milk ejection reflex, showed mom how to use it in case she would like doing some independent breastfeeding; she's a P5 and experienced.   Objective Infant data: Mother's Current Feeding Choice: Breast Milk  Infant feeding assessment Scale for Readiness: 2 Scale for Quality: 2  Maternal data: [id number]  Vaginal, Spontaneous Pumping frequency: 6 times/24 hours Pumped volume: 90 mL (90-120 ml) Flange Size: 24 Pump: Personal  Assessment Infant: LATCH Score: 9  Maternal: Milk volume: Normal (borderline WNL)  Intervention/Plan Interventions: Breast feeding basics reviewed; DEBP; Education Tools: Pump; Flanges Pump Education: Setup, frequency, and cleaning; Milk Storage  Plan of care: Encouraged pumping every 3 hours x 15-20 minutes, ideally 8 pumping sessions/24 hours  She'll try taking baby to a slightly pumped breast on feeding cues around feeding times using NS # 20 PRN She'll continue working on bottle feedings   No other support person at this time. All questions and concerns answered, family to contact [facility] services PRN.  Consult Status: NICU follow-up  NICU Follow-up type: Weekly NICU  follow up; Assist with IDF-2 (Mother does not need to pre-pump before breastfeeding)   Jamie Lutz 12/03/2021, 11:19 AM

## 2021-12-04 ENCOUNTER — Ambulatory Visit: Payer: Self-pay

## 2021-12-04 NOTE — Lactation Note (Signed)
This note was copied from a baby's chart.  NICU Lactation Consultation Note  Patient Name: Jamie Lutz TIWPY'K Date: 12/04/2021 Age:28 wk.o.   Subjective Reason for consult: Follow-up assessment; NICU baby; Preterm <34wks; Breastfeeding assistance  Lactation followed up with Jamie Lutz and infant, Jamie Lutz, to assist with breastfeeding. I assisted with latching infant in a modified laid back/cradle hold to the left breast using a size 20 NS. Jamie Lutz pre-pumped 4 ounces at 1320.  Infant latched readily to the NS with good pacing, audible swallows, and appropriate pauses. Slight stridor noted which became more noticeable around 7-10 minutes into the feeding, which is potentially consistent with fatigue.  Infant's affect was calm and quiet at the breast. I did not note any loss of breast milk or episodes of choking. Parent expressed concerns about her let down, but she stated that baby did well this feeding.   I encouraged her to repeat this process again at the next feeding (pre-pump 45 minutes prior) and to use a nipple shield again. If infant has another good feeding, she can try to reduce the amount that she pre-pumps gradually.  Jamie Lutz asked for follow up on Friday 11/16 at 1400.  Objective Infant data: Mother's Current Feeding Choice: Breast Milk  Infant feeding assessment Scale for Readiness: 2 Scale for Quality: 2  Maternal data: [id number]  Vaginal, Spontaneous Current breast feeding challenges:: NICU  Does the patient have breastfeeding experience prior to this delivery?: Yes  Pumping frequency: 6 times/24 hours Pumped volume: 120 mL Flange Size: 24  Pump: Personal  Assessment Infant: LATCH Score: 9   Maternal: Milk volume: Normal   Intervention/Plan Interventions: Breast feeding basics reviewed; Assisted with latch; Education; Support pillows  Tools: Nipple Shields Pump Education: Setup, frequency, and cleaning; Milk Storage Nipple shield size:  20  Plan: Consult Status: NICU follow-up  NICU Follow-up type: Assist with IDF-1 (Mother to pre-pump before breastfeeding)    Walker Shadow 12/04/2021, 2:45 PM

## 2021-12-11 ENCOUNTER — Ambulatory Visit: Payer: Self-pay

## 2021-12-11 NOTE — Lactation Note (Signed)
This note was copied from a baby's chart.  NICU Lactation Consultation Note  Patient Name: Jamie Lutz NIOEV'O Date: 12/11/2021 Age:28 wk.o.  Subjective Reason for consult: Follow-up assessment; NICU baby; Early term 31-38.6wks  Visited with family of 16 72/51 weeks old (adjusted) NICU female; [name] is a P5 and experienced breastfeeding. She voiced she's been taking baby to breast using NS # 20 and that is helping with her let down. She also noted that she's only pumping when she's at home; when she's not breastfeeding. Explained the importance of consistent pumping after feedings at the breast to keep up with her supply; she voiced her supply has decreased since the last time she saw lactation. Revised breastmilk storage guidelines per her request as well as lactogenesis III and pumping schedule. Asked her to call for latch assistance whenever is needed.  Objective Infant data: Mother's Current Feeding Choice: Breast Milk  Infant feeding assessment Scale for Readiness: 2 Scale for Quality: 2  Maternal data: [id number]  Vaginal, Spontaneous Pumping frequency: 4-5 times/24 hours; she's also putting baby to breast 3 times/24 hours Pumped volume: 75 mL Flange Size: 24 Pump: Personal  Assessment Infant: LATCH Score: 10  Feeding Status: (S) Ad lib (per order)  Maternal: Milk volume: Normal  Intervention/Plan Interventions: Breast feeding basics reviewed; DEBP; Education Tools: Pump; Flanges Pump Education: Setup, frequency, and cleaning; Milk Storage  Plan of care: Encouraged to start pumping after feedings at the breast to protect her supply.  She'll continue taking baby to a full or slightly pumped breast on feeding cues around feeding times using NS # 20 PRN She'll continue working on bottle feedings   No other support person at this time. All questions and concerns answered, family to contact [facility] services PRN.  Consult Status: NICU follow-up  NICU  Follow-up type: Weekly NICU follow up; Assist with IDF-2 (Mother does not need to pre-pump before breastfeeding)   Jamie Lutz Jamie Lutz 12/11/2021, 11:50 AM

## 2021-12-17 ENCOUNTER — Other Ambulatory Visit: Payer: 59

## 2021-12-17 ENCOUNTER — Ambulatory Visit: Payer: 59 | Admitting: Women's Health

## 2021-12-17 DIAGNOSIS — Z131 Encounter for screening for diabetes mellitus: Secondary | ICD-10-CM

## 2021-12-17 DIAGNOSIS — Z8632 Personal history of gestational diabetes: Secondary | ICD-10-CM

## 2021-12-31 ENCOUNTER — Ambulatory Visit (INDEPENDENT_AMBULATORY_CARE_PROVIDER_SITE_OTHER): Payer: 59 | Admitting: Women's Health

## 2021-12-31 ENCOUNTER — Other Ambulatory Visit: Payer: 59

## 2021-12-31 ENCOUNTER — Encounter: Payer: Self-pay | Admitting: Women's Health

## 2021-12-31 DIAGNOSIS — M25552 Pain in left hip: Secondary | ICD-10-CM | POA: Diagnosis not present

## 2021-12-31 DIAGNOSIS — Z8751 Personal history of pre-term labor: Secondary | ICD-10-CM
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
## 2021-12-31 NOTE — Patient Instructions (Signed)
Tips To Increase Milk Supply Lots of water! Enough so that your urine is clear Plenty of calories, if you're not getting enough calories, your milk supply can decrease Breastfeed/pump often, every 2-3 hours x 20-30mins Fenugreek 3 pills 3 times a day, this may make your urine smell like maple syrup Mother's Milk Tea Lactation cookies, google for the recipe Real oatmeal Body Armor sports drinks Greater Than hydration drink  

## 2022-01-01 LAB — GLUCOSE TOLERANCE, 2 HOURS W/ 1HR
Glucose, 1 hour: 152 mg/dL (ref 70–179)
Glucose, 2 hour: 94 mg/dL (ref 70–152)
Glucose, Fasting: 91 mg/dL (ref 70–91)

## 2022-01-30 ENCOUNTER — Encounter: Payer: Self-pay | Admitting: Radiology

## 2022-03-20 ENCOUNTER — Encounter: Payer: Self-pay | Admitting: Radiology

## 2022-04-14 ENCOUNTER — Ambulatory Visit
Admission: EM | Admit: 2022-04-14 | Discharge: 2022-04-14 | Disposition: A | Discharge status: Home / Self Care | Payer: 59 | Attending: Nurse Practitioner | Admitting: Nurse Practitioner

## 2022-04-14 DIAGNOSIS — M542 Cervicalgia: Secondary | ICD-10-CM

## 2022-04-14 DIAGNOSIS — J029 Acute pharyngitis, unspecified: Secondary | ICD-10-CM

## 2022-04-14 DIAGNOSIS — N898 Other specified noninflammatory disorders of vagina: Secondary | ICD-10-CM

## 2022-04-14 LAB — POCT URINALYSIS DIP (MANUAL ENTRY)
Bilirubin, UA: NEGATIVE
Glucose, UA: NEGATIVE mg/dL
Ketones, POC UA: NEGATIVE mg/dL
Leukocytes, UA: NEGATIVE
Nitrite, UA: NEGATIVE
Protein Ur, POC: NEGATIVE mg/dL
Spec Grav, UA: >=1.03 — AB (ref 1.010–1.025)
Urobilinogen, UA: 0.2 E.U./dL
pH, UA: 6 (ref 5.0–8.0)

## 2022-04-14 LAB — POCT RAPID STREP A (OFFICE): Rapid Strep A Screen: NEGATIVE

## 2022-04-14 MED ORDER — METHOCARBAMOL 500 MG PO TABS: 500.0000 mg | ORAL_TABLET | Freq: Two times a day (BID) | ORAL | 0 refills | Status: DC

## 2022-04-14 NOTE — ED Triage Notes (Signed)
Pt reports she has a sore throat, neck pain, bilateral ear pain, sharp pains in the back of her head x 2 days. Pain has increased in neck that radiates to her shoulders. Took ibuprofen but no relief.   Pt has low abdominal pain, frequent urination, and slight burning when urinating x 1 month.

## 2022-04-14 NOTE — ED Provider Notes (Signed)
[facility]    CSN: [id number] Arrival date & time: 04/14/22  [id number]      History   Chief Complaint Chief Complaint  Patient presents with   Neck Pain    HPI Jamie Lutz is a 27 y.o. female.    Neck Pain   Past Medical History:  Diagnosis Date   Anxiety    Depression    Gestational diabetes     Patient Active Problem List   Diagnosis Date Noted   History of preterm delivery 11/14/2021   Depression with anxiety 06/05/2016   History of gestational diabetes 02/25/2016   Adjustment disorder with anxious mood 11/03/2014   Myofascial abdominal wall pain 05/03/2014   Chronic abdominal pain 05/03/2014   Constipation 05/03/2014   Paresthesia 02/06/2014    Past Surgical History:  Procedure Laterality Date   PERINEAL LACERATION REPAIR  04/17/2011   Procedure: SUTURE REPAIR PERINEAL LACERATION;  Surgeon: Jonnie Kind, MD;  Location: [facility];  Service: Gynecology;  Laterality: N/A;    OB History     Gravida  5   Para  5   Term  4   Preterm  1   AB  0   Living  5      SAB  0   IAB  0   Ectopic  0   Multiple  0   Live Births  5            Home Medications    Prior to Admission medications   Medication Sig Start Date End Date Taking? Authorizing Provider  methocarbamol (ROBAXIN) 500 MG tablet Take 1 tablet (500 mg total) by mouth 2 (two) times daily. 04/14/22  Yes Darchelle Nunes-Warren, Alda Lea, NP  acetaminophen (TYLENOL) 500 MG tablet Take 2 tablets (1,000 mg total) by mouth every 6 (six) hours as needed for mild pain or moderate pain (for pain scale < 4). Patient not taking: Reported on 12/31/2021 11/15/21   Osborne Oman, MD  ibuprofen (ADVIL) 600 MG tablet Take 1 tablet (600 mg total) by mouth every 6 (six) hours as needed for moderate pain or cramping. Patient not taking: Reported on 12/31/2021 11/15/21   Anyanwu, Sallyanne Havers, MD  Prenatal Vit-Fe Fumarate-FA (PRENATAL PO) Take by mouth. Patient not taking: Reported on  12/31/2021    [provider]  senna-docusate (SENOKOT-S) 8.6-50 MG tablet Take 2 tablets by mouth at bedtime as needed for mild constipation or moderate constipation. Patient not taking: Reported on 12/31/2021 11/15/21   Osborne Oman, MD    Family History Family History  Problem Relation Age of Onset   Hypothyroidism Mother    Diabetes Maternal Grandmother    Cancer Maternal Grandfather        esophagus    Social History Social History   Tobacco Use   Smoking status: Never   Smokeless tobacco: Never  Vaping Use   Vaping Use: Never used  Substance Use Topics   Alcohol use: No    Alcohol/week: 0.0 standard drinks of alcohol   Drug use: No     Allergies   Patient has no known allergies.   Review of Systems Review of Systems  Musculoskeletal:  Positive for neck pain.     Physical Exam Triage Vital Signs ED Triage Vitals  Enc Vitals Group     BP 04/14/22 1901 117/79     Pulse Rate 04/14/22 1901 87     Resp 04/14/22 1901 20     Temp 04/14/22 1901  98 F (36.7 C)     Temp Source 04/14/22 1901 Oral     SpO2 04/14/22 1901 97 %     Weight --      Height --      Head Circumference --      Peak Flow --      Pain Score 04/14/22 1902 6     Pain Loc --      Pain Edu? --      Excl. in [place]? --    No data found.  Updated Vital Signs BP 117/79 (BP Location: Right Arm)   Pulse 87   Temp 98 F (36.7 C) (Oral)   Resp 20   LMP 04/09/2022 (Approximate)   SpO2 97%   Visual Acuity Right Eye Distance:   Left Eye Distance:   Bilateral Distance:    Right Eye Near:   Left Eye Near:    Bilateral Near:     Physical Exam Vitals and nursing note reviewed.  Constitutional:      General: She is not in acute distress.    Appearance: Normal appearance.  HENT:     Head: Normocephalic.     Right Ear: Tympanic membrane, ear canal and external ear normal.     Left Ear: Tympanic membrane, ear canal and external ear normal.     Nose: Nose normal.      Mouth/Throat:     Mouth: Mucous membranes are moist.     Pharynx: Posterior oropharyngeal erythema present.  Eyes:     Extraocular Movements: Extraocular movements intact.     Conjunctiva/sclera: Conjunctivae normal.     Pupils: Pupils are equal, round, and reactive to light.  Cardiovascular:     Rate and Rhythm: Normal rate and regular rhythm.     Pulses: Normal pulses.     Heart sounds: Normal heart sounds.  Pulmonary:     Effort: Pulmonary effort is normal. No respiratory distress.     Breath sounds: Normal breath sounds. No stridor. No wheezing, rhonchi or rales.  Abdominal:     General: Bowel sounds are normal.     Palpations: Abdomen is soft.     Tenderness: There is no abdominal tenderness.  Genitourinary:    Comments: GU exam deferred, self swab performed  Musculoskeletal:     Cervical back: Normal range of motion.  Lymphadenopathy:     Cervical: No cervical adenopathy.  Skin:    General: Skin is warm and dry.  Neurological:     General: No focal deficit present.     Mental Status: She is alert and oriented to person, place, and time.  Psychiatric:        Mood and Affect: Mood normal.        Behavior: Behavior normal.      UC Treatments / Results  Labs (all labs ordered are listed, but only abnormal results are displayed) Labs Reviewed  POCT URINALYSIS DIP (MANUAL ENTRY) - Abnormal; Notable for the following components:      Result Value   Spec Grav, UA >=1.030 (*)    Blood, UA large (*)    All other components within normal limits  POCT RAPID STREP A (OFFICE)  CERVICOVAGINAL ANCILLARY ONLY    EKG   Radiology No results found.  Procedures Procedures (including critical care time)  Medications Ordered in UC Medications - No data to display  Initial Impression / Assessment and Plan / UC Course  I have reviewed the triage vital signs and the nursing notes.  Pertinent labs & imaging results that were available during my care of the patient were  reviewed by me and considered in my medical decision making (see chart for details).  The patient is well-appearing, she is in no acute distress, vital signs are stable.  Rapid strep test was negative, culture has been ordered.  The urinalysis is also negative for urinary tract infection.  A urine culture has also been ordered.  Patient complains of vaginal discharge and vaginal odor.  Cytology swab is pending.  Patient's neck pain appears to be musculoskeletal in nature.  Cannot identify triggering event that may have caused her pain.  Will treat patient with methocarbamol 500 mg twice daily.  Also recommended neck exercises, along with the use of ice and heat as needed.  With regard to her throat pain, recommend warm salt water gargles 3-4 times daily while symptoms persist.  Also recommend the use of over-the-counter analgesics for pain or discomfort.  Patient was advised that if symptoms do not improve, recommend that she follow-up with a primary care physician for further evaluation.  Patient is in agreement with this plan of care and verbalizes understanding.  All questions were answered.  Patient stable for discharge.   Final Clinical Impressions(s) / UC Diagnoses   Final diagnoses:  Vaginal discharge  Vaginal odor  Neck pain  Sore throat     Discharge Instructions      The rapid strep test was negative.  A throat culture is pending.  Your urinalysis also did not indicate a urinary tract infection.  A culture has also been ordered.  You will be contacted if the pending test results are positive. Take medication as prescribed. Recommend increasing your water intake.  Try to drink at least 8-10 8 ounce glasses of water daily while symptoms persist. Continue use of over-the-counter analgesics such as ibuprofen or Tylenol as needed for pain or discomfort. Warm salt water gargles 3-4 times daily for any throat pain or discomfort. Recommend a diet with soft foods while throat pain  persist. Recommend use use of ice or heat as needed.  Apply ice for pain or swelling, heat for spasm or stiffness.  Apply for 20 minutes, remove for 1 hour, then repeat is much as possible. Gentle stretching and neck exercises while symptoms persist. If your neck pain does not improve, recommend that you follow-up with a primary care physician for further evaluation.  I have provided you information of primary care physician in the area who is accepting new patients. Follow-up as needed.    ED Prescriptions     Medication Sig Dispense Auth. Provider   methocarbamol (ROBAXIN) 500 MG tablet Take 1 tablet (500 mg total) by mouth 2 (two) times daily. 20 tablet Nery Kalisz-Warren, Alda Lea, NP      PDMP not reviewed this encounter.   Tish Men, NP 04/14/22 1939

## 2022-04-14 NOTE — Discharge Instructions (Addendum)
The rapid strep test was negative.  A throat culture is pending.  Your urinalysis also did not indicate a urinary tract infection.  A culture has also been ordered.  You will be contacted if the pending test results are positive. Take medication as prescribed. Recommend increasing your water intake.  Try to drink at least 8-10 8 ounce glasses of water daily while symptoms persist. Continue use of over-the-counter analgesics such as ibuprofen or Tylenol as needed for pain or discomfort. Warm salt water gargles 3-4 times daily for any throat pain or discomfort. Recommend a diet with soft foods while throat pain persist. Recommend use use of ice or heat as needed.  Apply ice for pain or swelling, heat for spasm or stiffness.  Apply for 20 minutes, remove for 1 hour, then repeat is much as possible. Gentle stretching and neck exercises while symptoms persist. If your neck pain does not improve, recommend that you follow-up with a primary care physician for further evaluation.  I have provided you information of primary care physician in the area who is accepting new patients. Follow-up as needed.

## 2022-04-15 LAB — CERVICOVAGINAL ANCILLARY ONLY
Bacterial Vaginitis (gardnerella): NEGATIVE
Candida Glabrata: NEGATIVE
Candida Vaginitis: NEGATIVE
Chlamydia: NEGATIVE
Comment: NEGATIVE
Comment: NEGATIVE
Comment: NEGATIVE
Comment: NEGATIVE
Comment: NEGATIVE
Comment: NORMAL
Neisseria Gonorrhea: NEGATIVE
Trichomonas: NEGATIVE

## 2022-05-05 ENCOUNTER — Emergency Department (HOSPITAL_COMMUNITY): Payer: Self-pay

## 2022-05-05 ENCOUNTER — Emergency Department (HOSPITAL_COMMUNITY)
Admission: EM | Admit: 2022-05-05 | Discharge: 2022-05-05 | Disposition: A | Discharge status: Home / Self Care | Payer: Self-pay | Attending: Emergency Medicine | Admitting: Emergency Medicine

## 2022-05-05 ENCOUNTER — Encounter (HOSPITAL_COMMUNITY): Payer: Self-pay | Admitting: Emergency Medicine

## 2022-05-05 ENCOUNTER — Other Ambulatory Visit: Payer: Self-pay

## 2022-05-05 DIAGNOSIS — R002 Palpitations: Secondary | ICD-10-CM | POA: Insufficient documentation

## 2022-05-05 DIAGNOSIS — R0789 Other chest pain: Secondary | ICD-10-CM | POA: Insufficient documentation

## 2022-05-05 DIAGNOSIS — R531 Weakness: Secondary | ICD-10-CM | POA: Insufficient documentation

## 2022-05-05 LAB — CBC
HCT: 42.3 % (ref 36.0–46.0)
Hemoglobin: 13.7 g/dL (ref 12.0–15.0)
MCH: 28.4 pg (ref 26.0–34.0)
MCHC: 32.4 g/dL (ref 30.0–36.0)
MCV: 87.6 fL (ref 80.0–100.0)
Platelets: 231 10*3/uL (ref 150–400)
RBC: 4.83 MIL/uL (ref 3.87–5.11)
RDW: 13.8 % (ref 11.5–15.5)
WBC: 8.3 10*3/uL (ref 4.0–10.5)
nRBC: 0 % (ref 0.0–0.2)

## 2022-05-05 LAB — BASIC METABOLIC PANEL
Anion gap: 4 — ABNORMAL LOW (ref 5–15)
BUN: 10 mg/dL (ref 6–20)
CO2: 23 mmol/L (ref 22–32)
Calcium: 8.5 mg/dL — ABNORMAL LOW (ref 8.9–10.3)
Chloride: 106 mmol/L (ref 98–111)
Creatinine, Ser: 0.56 mg/dL (ref 0.44–1.00)
GFR, Estimated: >60 mL/min (ref >60)
Glucose, Bld: 104 mg/dL — ABNORMAL HIGH (ref 70–99)
Potassium: 3.8 mmol/L (ref 3.5–5.1)
Sodium: 133 mmol/L — ABNORMAL LOW (ref 135–145)

## 2022-05-05 LAB — HCG, QUANTITATIVE, PREGNANCY: hCG, Beta Chain, Quant, S: <1 m[IU]/mL (ref <5)

## 2022-05-05 LAB — TROPONIN I (HIGH SENSITIVITY)
Troponin I (High Sensitivity): <2 ng/L (ref <18)
Troponin I (High Sensitivity): <2 ng/L (ref <18)

## 2022-05-05 LAB — D-DIMER, QUANTITATIVE: D-Dimer, Quant: 0.78 ug/mL-FEU — ABNORMAL HIGH (ref 0.00–0.50)

## 2022-05-05 LAB — MAGNESIUM: Magnesium: 2.2 mg/dL (ref 1.7–2.4)

## 2022-05-05 LAB — TSH: TSH: 1.233 u[IU]/mL (ref 0.350–4.500)

## 2022-05-05 LAB — T4, FREE: Free T4: 0.88 ng/dL (ref 0.61–1.12)

## 2022-05-05 MED ORDER — IOHEXOL 350 MG/ML SOLN
75.0000 mL | Freq: Once | INTRAVENOUS | Status: AC | PRN
  Administered 2022-05-05: 75 mL via INTRAVENOUS

## 2022-05-05 NOTE — Discharge Instructions (Addendum)
You have been referred to the cardiologist office for further evaluation of your palpitations.  Someone from the local cardiology office should be contacting you in the next 1 to 2 days to arrange appointment.  I have also provided to local clinics for you to contact to establish primary care.

## 2022-05-05 NOTE — ED Provider Triage Note (Signed)
Emergency Medicine Provider Triage Evaluation Note  Jamie Lutz , a 27 y.o. female  was evaluated in triage.  Pt complains of intermittent episodes of pain numbness and tingling to her entire left side.  She notes intermittent headache and palpitations.  Symptoms present for 2 weeks.  No some intermittent chest pains and generalized weakness 3 days ago.  Began taking magnesium 4 times a day 2 weeks ago without improvement.  States multiple family members have problems with their thyroid.  She describes having feelings of her heart racing for several weeks.  She denies any recent illness, illicit drug use, fever, chills, or shortness of breath  Review of Systems  Positive: Chest pain, numbness and tingling of her entire left side, palpitations, generalized weakness Negative: Shortness of breath, vomiting, fever, chills, cough  Physical Exam  BP (!) 124/91 (BP Location: Right Arm)   Pulse 76   Temp 98.7 F (37.1 C) (Oral)   Resp 19   LMP 04/09/2022 (Approximate)   SpO2 100%  Gen:   Awake, no distress   Resp:  Normal effort  MSK:   Moves extremities without difficulty  Other:    Medical Decision Making  Medically screening exam initiated at 11:23 AM.  Appropriate orders placed.  Theodora Blow was informed that the remainder of the evaluation will be completed by another provider, this initial triage assessment does not replace that evaluation, and the importance of remaining in the ED until their evaluation is complete.     Pauline Aus, PA-C 05/05/22 1126

## 2022-05-05 NOTE — ED Triage Notes (Signed)
Pt c/o headache x 2 weeks constant, making her whole left side hurt down to left calf, states headache just in left side of head. Denies injury. Sometimes blurred vision to both eyes per pt.  C/o chills last night, c/o gen weakness x 3 days. No obvious gen weakness noted. Pt ambulatory with steady gait.  C/o nausea and pain to left chest into back, is not worse with deep breathing. Non diaphoretic. Color wnl. C/o "heart palpitations" x 3 weeks. Chest hurts to touch and heart was racing last night per pt States cp right now to left chest is sore, denies injury.

## 2022-05-05 NOTE — ED Notes (Signed)
Pt verbalized understanding of discharge instructions. Opportunity for questions provided.  

## 2022-05-05 NOTE — ED Provider Notes (Signed)
[place] EMERGENCY DEPARTMENT AT [facility] Provider Note   CSN: [id number] Arrival date & time: 05/05/22  4098     History  Chief Complaint  Patient presents with   Chest Pain   Headache   Weakness    Thecla Forgione is a 27 y.o. female.   Chest Pain Associated symptoms: headache, palpitations and weakness   Associated symptoms: no abdominal pain, no cough, no dizziness, no fever, no nausea, no numbness, no shortness of breath and no vomiting   Headache Associated symptoms: myalgias and weakness   Associated symptoms: no abdominal pain, no cough, no dizziness, no fever, no nausea, no numbness, no seizures and no vomiting   Weakness Associated symptoms: chest pain, headaches and myalgias   Associated symptoms: no abdominal pain, no cough, no dizziness, no dysuria, no fever, no nausea, no seizures, no shortness of breath and no vomiting        Blanka Rockholt is a 27 y.o. female with complaint of left-sided headache x 2 weeks.  Describes aching pain from the left side of her head down to her left lower leg.  She also has been experiencing generalized weakness for several days.  Feels as though she is having palpitations of her heart for 3 weeks.  States her chest was racing on the night prior to arrival and she now is experiencing soreness of her left chest.  She denies any shortness of breath and states pain is not aggravated by deep breathing or movement.  She denies any neck pain or stiffness, cough, facial or extremity numbness or localized weakness.  She does note significant family history of thyroid disease.    Home Medications Prior to Admission medications   Medication Sig Start Date End Date Taking? Authorizing Provider  acetaminophen (TYLENOL) 500 MG tablet Take 2 tablets (1,000 mg total) by mouth every 6 (six) hours as needed for mild pain or moderate pain (for pain scale < 4). Patient not taking: Reported on 12/31/2021 11/15/21   Tereso Newcomer, MD  ibuprofen (ADVIL) 600 MG tablet Take 1 tablet (600 mg total) by mouth every 6 (six) hours as needed for moderate pain or cramping. Patient not taking: Reported on 12/31/2021 11/15/21   Anyanwu, Jethro Bastos, MD  methocarbamol (ROBAXIN) 500 MG tablet Take 1 tablet (500 mg total) by mouth 2 (two) times daily. 04/14/22   Leath-Warren, Sadie Haber, NP  Prenatal Vit-Fe Fumarate-FA (PRENATAL PO) Take by mouth. Patient not taking: Reported on 12/31/2021    [provider]  senna-docusate (SENOKOT-S) 8.6-50 MG tablet Take 2 tablets by mouth at bedtime as needed for mild constipation or moderate constipation. Patient not taking: Reported on 12/31/2021 11/15/21   Tereso Newcomer, MD      Allergies    Patient has no known allergies.    Review of Systems   Review of Systems  Constitutional:  Negative for chills and fever.  Eyes:  Negative for visual disturbance.  Respiratory:  Negative for cough, chest tightness and shortness of breath.   Cardiovascular:  Positive for chest pain and palpitations.  Gastrointestinal:  Negative for abdominal pain, nausea and vomiting.  Genitourinary:  Negative for dysuria and flank pain.  Musculoskeletal:  Positive for myalgias.  Skin:  Negative for rash.  Neurological:  Positive for weakness and headaches. Negative for dizziness, seizures, facial asymmetry and numbness.    Physical Exam Updated Vital Signs BP 119/73   Pulse 75   Temp 98 F (36.7 C) (  Oral)   Resp 15   LMP 04/09/2022 (Approximate)   SpO2 100%  Physical Exam Vitals and nursing note reviewed.  Constitutional:      General: She is not in acute distress.    Appearance: She is well-developed. She is not ill-appearing or toxic-appearing.  HENT:     Head: Atraumatic.     Mouth/Throat:     Mouth: Mucous membranes are moist.  Eyes:     Extraocular Movements: Extraocular movements intact.     Conjunctiva/sclera: Conjunctivae normal.     Pupils: Pupils are equal, round, and reactive  to light.  Cardiovascular:     Rate and Rhythm: Normal rate and regular rhythm.     Pulses: Normal pulses.  Pulmonary:     Effort: Pulmonary effort is normal. No respiratory distress.  Chest:     Chest wall: No tenderness.  Abdominal:     General: There is no distension.     Palpations: Abdomen is soft.     Tenderness: There is no abdominal tenderness.  Musculoskeletal:        General: No swelling or tenderness. Normal range of motion.     Right lower leg: No edema.     Left lower leg: No edema.  Skin:    General: Skin is warm.     Capillary Refill: Capillary refill takes less than 2 seconds.     Findings: No erythema, lesion or rash.  Neurological:     General: No focal deficit present.     Mental Status: She is alert.     GCS: GCS eye subscore is 4. GCS verbal subscore is 5. GCS motor subscore is 6.     Cranial Nerves: Cranial nerves 2-12 are intact.     Sensory: Sensation is intact. No sensory deficit.     Motor: No weakness.     Coordination: Coordination is intact.     Gait: Gait is intact.     Comments: CN II through XII intact.  Speech clear.  No pronator drift.  No facial weakness or dysarthria.     ED Results / Procedures / Treatments   Labs (all labs ordered are listed, but only abnormal results are displayed) Labs Reviewed  BASIC METABOLIC PANEL - Abnormal; Notable for the following components:      Result Value   Sodium 133 (*)    Glucose, Bld 104 (*)    Calcium 8.5 (*)    Anion gap 4 (*)    All other components within normal limits  D-DIMER, QUANTITATIVE - Abnormal; Notable for the following components:   D-Dimer, Quant 0.78 (*)    All other components within normal limits  CBC  MAGNESIUM  TSH  T4, FREE  HCG, QUANTITATIVE, PREGNANCY  TROPONIN I (HIGH SENSITIVITY)  TROPONIN I (HIGH SENSITIVITY)    EKG EKG Interpretation  Date/Time:  Monday May 05 2022 11:03:58 EDT Ventricular Rate:  80 PR Interval:  154 QRS Duration: 84 QT  Interval:  372 QTC Calculation: 429 R Axis:   -54 Text Interpretation: Normal sinus rhythm Left axis deviation Abnormal ECG When compared with ECG of 13-Feb-2019 17:48, No significant change since last tracing Confirmed by Meridee Score [phone]) on 05/05/2022 2:54:10 PM  Radiology CT Angio Chest PE W and/or Wo Contrast  Result Date: 05/05/2022 CLINICAL DATA:  Chest pain shortness of breath EXAM: CT ANGIOGRAPHY CHEST WITH CONTRAST TECHNIQUE: Multidetector CT imaging of the chest was performed using the standard protocol during bolus administration of intravenous contrast. Multiplanar CT image  reconstructions and MIPs were obtained to evaluate the vascular anatomy. RADIATION DOSE REDUCTION: This exam was performed according to the departmental dose-optimization program which includes automated exposure control, adjustment of the mA and/or kV according to patient size and/or use of iterative reconstruction technique. CONTRAST:  75mL OMNIPAQUE IOHEXOL 350 MG/ML SOLN COMPARISON:  02/13/2019. FINDINGS: Cardiovascular: Satisfactory opacification of the pulmonary arteries to the segmental level. No evidence of pulmonary embolism. Normal heart size. No pericardial effusion. Mediastinum/Nodes: No enlarged mediastinal, hilar, or axillary lymph nodes. Thyroid gland, trachea, and esophagus demonstrate no significant findings. Lungs/Pleura: Tiny right apical nodules measuring couple of mm. Unless the patient is at high risk this does not need follow up. No pneumonia or pulmonary edema. No pneumothorax or pleural effusion. Upper Abdomen: No acute abnormality. Musculoskeletal: No chest wall abnormality. No acute or significant osseous findings. Review of the MIP images confirms the above findings. IMPRESSION: 1. Very tiny right apical lung nodules do not need to be followed up unless patient is high risk. 2. No evidence of PE and no acute cardiopulmonary process Electronically Signed   By: Layla Maw M.D.   On:  05/05/2022 16:32   DG Chest 2 View  Result Date: 05/05/2022 CLINICAL DATA:  Chest pain EXAM: CHEST - 2 VIEW COMPARISON:  CXR 02/13/19, CTA chest 02/13/19 FINDINGS: No pleural effusion. No pneumothorax. There is a rounded density projecting over the left lung apex is new from prior exam. Normal cardiac and mediastinal contours. No radiographically apparent displaced rib fractures. Visualized upper abdomen is unremarkable. IMPRESSION: 1. Rounded density projecting over the left lung apex is new from prior exam. While this may represent a confluence of shadows, recommend further evaluation with a repeat chest radiograph after removal of overlying external material. 2. Otherwise no acute cardiopulmonary abnormality. Electronically Signed   By: Lorenza Cambridge M.D.   On: 05/05/2022 11:25    Procedures Procedures    Medications Ordered in ED Medications  iohexol (OMNIPAQUE) 350 MG/ML injection 75 mL (75 mLs Intravenous Contrast Given 05/05/22 1622)    ED Course/ Medical Decision Making/ A&P                             Medical Decision Making Patient here with complaints of heart palpitations, headache, generalized weakness, and aching pain to the left side of her body.  Symptoms have been present for weeks.  Symptoms have been constant and do not appear to be worsening.  No known injury.  Vital signs reviewed.  Patient is nontoxic-appearing.  No focal neurodeficits on my exam  Differential would include but not limited to ACS, PE, musculoskeletal injury, atypical migraine, meningitis stroke, cardiac arrhythmia, anxiety reaction  Amount and/or Complexity of Data Reviewed Labs: ordered.    Details: Labs interpreted by me no evidence of leukocytosis, hemoglobin unremarkable.  Chemistries without significant derangement, magnesium unremarkable.  Quantitative hCG less than 1, troponin less than 2, TSH and free T4 unremarkable.  D-dimer slightly elevated at 0.78 Radiology: ordered.    Details: Chest  x-ray shows rounded density projecting over the left lung apex possible shadow.  CT angio of the chest ordered for further evaluation of density and elevated D-dimer.  Imaging without evidence of PE or acute cardiopulmonary process.  Very tiny right apical lung nodule noted that does not recommend further evaluation unless patient is high risk ECG/medicine tests: ordered.    Details: EKG shows normal sinus rhythm.  No significant change from last tracing  Discussion of management or test interpretation with external provider(s): Patient here with multiple complaints that have been present for weeks.  Most concerning reported palpitations.  She is reporting headaches and pain to the left side of her body.  I do not appreciate any focal neurodeficits on exam low clinical suspicion for ACS, acute neurologic process or meningitis.  CT angio of the chest without presence of PE.  Tiny lung nodule and patient who is not high risk.  I feel she would benefit from cardiology follow-up regarding her palpitations as she may need Holter monitoring for further evaluation of this.  She is agreeable to plan.  She appears appropriate for discharge home  Risk Prescription drug management. Risk Details: Ambulatory referral to cardiology           Final Clinical Impression(s) / ED Diagnoses Final diagnoses:  Chest wall pain  Generalized weakness  Palpitations    Rx / DC Orders ED Discharge Orders     None         Pauline Aus, PA-C 05/08/22 1446    Terrilee Files, MD 05/09/22 [phone]

## 2022-05-05 NOTE — ED Notes (Signed)
Pt has multiple complaints.  Pt c/o L side chest/arm/abdomen pain, intermittent nausea, and intermittent generalized spasms x "a while."

## 2022-05-08 ENCOUNTER — Emergency Department (HOSPITAL_COMMUNITY)
Admission: EM | Admit: 2022-05-08 | Discharge: 2022-05-09 | Discharge status: Against Medical Advice | Payer: Self-pay | Attending: Emergency Medicine | Admitting: Emergency Medicine

## 2022-05-08 ENCOUNTER — Encounter (HOSPITAL_COMMUNITY): Payer: Self-pay | Admitting: Emergency Medicine

## 2022-05-08 ENCOUNTER — Emergency Department (HOSPITAL_COMMUNITY): Payer: Self-pay

## 2022-05-08 ENCOUNTER — Other Ambulatory Visit: Payer: Self-pay

## 2022-05-08 DIAGNOSIS — R1013 Epigastric pain: Secondary | ICD-10-CM | POA: Insufficient documentation

## 2022-05-08 DIAGNOSIS — R079 Chest pain, unspecified: Secondary | ICD-10-CM | POA: Insufficient documentation

## 2022-05-08 DIAGNOSIS — R110 Nausea: Secondary | ICD-10-CM | POA: Insufficient documentation

## 2022-05-08 DIAGNOSIS — Z5321 Procedure and treatment not carried out due to patient leaving prior to being seen by health care provider: Secondary | ICD-10-CM | POA: Insufficient documentation

## 2022-05-08 DIAGNOSIS — R350 Frequency of micturition: Secondary | ICD-10-CM | POA: Insufficient documentation

## 2022-05-08 DIAGNOSIS — R1012 Left upper quadrant pain: Secondary | ICD-10-CM | POA: Insufficient documentation

## 2022-05-08 LAB — CBC WITH DIFFERENTIAL/PLATELET
Abs Immature Granulocytes: 0.04 10*3/uL (ref 0.00–0.07)
Basophils Absolute: 0.1 10*3/uL (ref 0.0–0.1)
Basophils Relative: 1 %
Eosinophils Absolute: 0 10*3/uL (ref 0.0–0.5)
Eosinophils Relative: 0 %
HCT: 40.1 % (ref 36.0–46.0)
Hemoglobin: 13.3 g/dL (ref 12.0–15.0)
Immature Granulocytes: 0 %
Lymphocytes Relative: 24 %
Lymphs Abs: 2.6 10*3/uL (ref 0.7–4.0)
MCH: 28.2 pg (ref 26.0–34.0)
MCHC: 33.2 g/dL (ref 30.0–36.0)
MCV: 85 fL (ref 80.0–100.0)
Monocytes Absolute: 0.6 10*3/uL (ref 0.1–1.0)
Monocytes Relative: 5 %
Neutro Abs: 7.6 10*3/uL (ref 1.7–7.7)
Neutrophils Relative %: 70 %
Platelets: 225 10*3/uL (ref 150–400)
RBC: 4.72 MIL/uL (ref 3.87–5.11)
RDW: 13.8 % (ref 11.5–15.5)
WBC: 10.9 10*3/uL — ABNORMAL HIGH (ref 4.0–10.5)
nRBC: 0 % (ref 0.0–0.2)

## 2022-05-08 LAB — COMPREHENSIVE METABOLIC PANEL
ALT: 24 U/L (ref 0–44)
AST: 20 U/L (ref 15–41)
Albumin: 4.2 g/dL (ref 3.5–5.0)
Alkaline Phosphatase: 71 U/L (ref 38–126)
Anion gap: 13 (ref 5–15)
BUN: 11 mg/dL (ref 6–20)
CO2: 23 mmol/L (ref 22–32)
Calcium: 9.3 mg/dL (ref 8.9–10.3)
Chloride: 103 mmol/L (ref 98–111)
Creatinine, Ser: 0.65 mg/dL (ref 0.44–1.00)
GFR, Estimated: >60 mL/min (ref >60)
Glucose, Bld: 116 mg/dL — ABNORMAL HIGH (ref 70–99)
Potassium: 3.8 mmol/L (ref 3.5–5.1)
Sodium: 139 mmol/L (ref 135–145)
Total Bilirubin: 0.6 mg/dL (ref 0.3–1.2)
Total Protein: 7.5 g/dL (ref 6.5–8.1)

## 2022-05-08 LAB — URINALYSIS, ROUTINE W REFLEX MICROSCOPIC
Bacteria, UA: NONE SEEN
Bilirubin Urine: NEGATIVE
Glucose, UA: NEGATIVE mg/dL
Hgb urine dipstick: NEGATIVE
Ketones, ur: NEGATIVE mg/dL
Nitrite: NEGATIVE
Protein, ur: NEGATIVE mg/dL
Specific Gravity, Urine: 1.017 (ref 1.005–1.030)
pH: 6 (ref 5.0–8.0)

## 2022-05-08 LAB — LIPASE, BLOOD: Lipase: 32 U/L (ref 11–51)

## 2022-05-08 LAB — I-STAT BETA HCG BLOOD, ED (MC, WL, AP ONLY): I-stat hCG, quantitative: <5 m[IU]/mL (ref <5)

## 2022-05-08 MED ORDER — IOHEXOL 350 MG/ML SOLN
75.0000 mL | Freq: Once | INTRAVENOUS | Status: AC | PRN
  Administered 2022-05-08: 75 mL via INTRAVENOUS

## 2022-05-08 NOTE — ED Triage Notes (Addendum)
Pt c/o abdominal pain since Saturday. Reports left sided body pain for a couple weeks. Reports heart palpitations and was evaluated at ER yesterday. Endorses urinary frequency and nausea.

## 2022-05-08 NOTE — ED Provider Triage Note (Signed)
Emergency Medicine Provider Triage Evaluation Note  Jamie Lutz , a 27 y.o. female  was evaluated in triage.  Pt complains of abdominal pain.  Started this past Saturday.  Primarily in the left upper quadrant extending to the epigastric region.  At times radiates to the back.  Endorses nausea but no vomiting diarrhea.  Had a normal bowel movement this morning.  Endorses increased urinary frequency but no other urinary changes.  Denies fever and chills.  Also endorsing intermittent chest pain.  States she had a recent workup for pulmonary embolism with a negative chest CT on Monday.  Review of Systems  Positive: See above Negative: See above  Physical Exam  BP 111/74 (BP Location: Right Arm)   Pulse 87   Temp 98.2 F (36.8 C)   Resp 18   Ht  (1.626 m)   Wt 81.6 kg   LMP 04/09/2022 (Approximate)   SpO2 98%   BMI 30.90 kg/m  Gen:   Awake, no distress   Resp:  Normal effort  MSK:   Moves extremities without difficulty  Other:  Epigastric and left upper quadrant tenderness  Medical Decision Making  Medically screening exam initiated at 9:10 PM.  Appropriate orders placed.  Jamie Lutz was informed that the remainder of the evaluation will be completed by another provider, this initial triage assessment does not replace that evaluation, and the importance of remaining in the ED until their evaluation is complete.  Work up started   Gareth Eagle, Cordelia Poche 05/08/22 2111

## 2022-05-09 NOTE — ED Notes (Signed)
Pt stated she wanted to leave due to wait time. IV removed.

## 2022-05-15 ENCOUNTER — Other Ambulatory Visit: Payer: Self-pay

## 2022-05-15 ENCOUNTER — Emergency Department (HOSPITAL_COMMUNITY)
Admission: EM | Admit: 2022-05-15 | Discharge: 2022-05-15 | Disposition: A | Discharge status: Home / Self Care | Payer: Self-pay | Attending: Emergency Medicine | Admitting: Emergency Medicine

## 2022-05-15 DIAGNOSIS — R12 Heartburn: Secondary | ICD-10-CM | POA: Insufficient documentation

## 2022-05-15 DIAGNOSIS — J069 Acute upper respiratory infection, unspecified: Secondary | ICD-10-CM | POA: Insufficient documentation

## 2022-05-15 LAB — URINALYSIS, ROUTINE W REFLEX MICROSCOPIC
Bacteria, UA: NONE SEEN
Bilirubin Urine: NEGATIVE
Glucose, UA: NEGATIVE mg/dL
Ketones, ur: NEGATIVE mg/dL
Leukocytes,Ua: NEGATIVE
Nitrite: NEGATIVE
Protein, ur: NEGATIVE mg/dL
Specific Gravity, Urine: 1.005 (ref 1.005–1.030)
pH: 7 (ref 5.0–8.0)

## 2022-05-15 LAB — COMPREHENSIVE METABOLIC PANEL
ALT: 17 U/L (ref 0–44)
AST: 18 U/L (ref 15–41)
Albumin: 4.2 g/dL (ref 3.5–5.0)
Alkaline Phosphatase: 61 U/L (ref 38–126)
Anion gap: 8 (ref 5–15)
BUN: 6 mg/dL (ref 6–20)
CO2: 24 mmol/L (ref 22–32)
Calcium: 9 mg/dL (ref 8.9–10.3)
Chloride: 106 mmol/L (ref 98–111)
Creatinine, Ser: 0.65 mg/dL (ref 0.44–1.00)
GFR, Estimated: >60 mL/min (ref >60)
Glucose, Bld: 110 mg/dL — ABNORMAL HIGH (ref 70–99)
Potassium: 3.9 mmol/L (ref 3.5–5.1)
Sodium: 138 mmol/L (ref 135–145)
Total Bilirubin: 0.6 mg/dL (ref 0.3–1.2)
Total Protein: 7.8 g/dL (ref 6.5–8.1)

## 2022-05-15 LAB — I-STAT BETA HCG BLOOD, ED (MC, WL, AP ONLY): I-stat hCG, quantitative: <5 m[IU]/mL (ref <5)

## 2022-05-15 LAB — LIPASE, BLOOD: Lipase: 31 U/L (ref 11–51)

## 2022-05-15 LAB — CBC
HCT: 40.8 % (ref 36.0–46.0)
Hemoglobin: 13.2 g/dL (ref 12.0–15.0)
MCH: 28 pg (ref 26.0–34.0)
MCHC: 32.4 g/dL (ref 30.0–36.0)
MCV: 86.4 fL (ref 80.0–100.0)
Platelets: 212 10*3/uL (ref 150–400)
RBC: 4.72 MIL/uL (ref 3.87–5.11)
RDW: 13.7 % (ref 11.5–15.5)
WBC: 6 10*3/uL (ref 4.0–10.5)
nRBC: 0 % (ref 0.0–0.2)

## 2022-05-15 MED ORDER — ACETAMINOPHEN 325 MG PO TABS
650.0000 mg | ORAL_TABLET | Freq: Four times a day (QID) | ORAL | Status: DC | PRN
  Administered 2022-05-15: 650 mg via ORAL
  Filled 2022-05-15: qty 2

## 2022-05-15 NOTE — ED Notes (Signed)
VITALS ENTERED IN ERROR

## 2022-05-15 NOTE — Discharge Instructions (Addendum)
It was a pleasure taking care of you today. Your blood work was not concerning for anemia or infection. Please keep your appointment for H.Pylori screening tomorrow and follow up with your primary care provider.   Seek emergency care if experiencing new or worsening symptoms.

## 2022-05-15 NOTE — ED Triage Notes (Signed)
Pt. Stated, I have burning on my stomach , throat, and ears. I went to my Dr. Smitty Cords and Im to see a spine Dr. Cause Im having pain at my head to my toes. That's next week. I also have a headache.
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
## 2022-05-20 ENCOUNTER — Other Ambulatory Visit (INDEPENDENT_AMBULATORY_CARE_PROVIDER_SITE_OTHER): Payer: Self-pay

## 2022-05-20 ENCOUNTER — Ambulatory Visit (INDEPENDENT_AMBULATORY_CARE_PROVIDER_SITE_OTHER): Payer: Self-pay | Admitting: Orthopedic Surgery

## 2022-05-20 ENCOUNTER — Encounter: Payer: Self-pay | Admitting: Orthopedic Surgery

## 2022-05-20 VITALS — BP 130/92 | HR 85 | Ht 64.0 in | Wt 173.0 lb

## 2022-05-20 DIAGNOSIS — M25552 Pain in left hip: Secondary | ICD-10-CM

## 2022-05-20 DIAGNOSIS — M76892 Other specified enthesopathies of left lower limb, excluding foot: Secondary | ICD-10-CM

## 2022-05-20 DIAGNOSIS — M7062 Trochanteric bursitis, left hip: Secondary | ICD-10-CM

## 2022-05-20 NOTE — Progress Notes (Signed)
New Patient Visit  Assessment: Jamie Lutz is a 27 y.o. female with the following: 1. Pain in left hip 2. Tendinitis of left hip flexor 3. Greater trochanteric bursitis of left hip  Plan: Lallie Strahm has diffuse tenderness to palpation.  Specifically, she has tenderness to palpation within the left hip flexor area, as well as pain with resisted hip flexion.  She also has exquisite tenderness to palpation of the greater trochanter laterally.  AP pelvis demonstrates normal bilateral hip alignment, without concerning features.  She is also scheduled to be evaluated for back pain tomorrow.  At this point, I am recommending physical therapy for her diffuse pain.  She can continue with medications as needed.  She will follow-up as needed.  Follow-up: Return if symptoms worsen or fail to improve.  Subjective:  Chief Complaint  Patient presents with   New Patient (Initial Visit)   Hip Pain    LT hip/ has been painful since giving birth 10/2021    History of Present Illness: Jamie Lutz is a 27 y.o. female who presents for evaluation of left hip pain.  She gave birth in October, 2023.  Since then, she has had a lot of pain in the left side.  She reports pain in the anterior aspect the left hip, as well as the lateral aspect of the left hip.  She is also having pain in the lower back, which radiates into her foot.  She has generalized weakness in the left side.  No prior injuries to her left hip.  She is not taking medicines.  She has not worked with physical therapy.   Review of Systems: No fevers or chills No numbness or tingling No chest pain No shortness of breath No bowel or bladder dysfunction No GI distress No headaches   Medical History:  Past Medical History:  Diagnosis Date   Anxiety    Depression    Gestational diabetes     Past Surgical History:  Procedure Laterality Date   PERINEAL LACERATION REPAIR  04/17/2011   Procedure: SUTURE REPAIR  PERINEAL LACERATION;  Surgeon: Tilda Burrow, MD;  Location: [facility];  Service: Gynecology;  Laterality: N/A;    Family History  Problem Relation Age of Onset   Hypothyroidism Mother    Diabetes Maternal Grandmother    Cancer Maternal Grandfather        esophagus   Social History   Tobacco Use   Smoking status: Never   Smokeless tobacco: Never  Vaping Use   Vaping Use: Never used  Substance Use Topics   Alcohol use: No    Alcohol/week: 0.0 standard drinks of alcohol   Drug use: No    No Known Allergies  Current Meds  Medication Sig   Cholecalciferol (VITAMIN D-3 PO) Take 1 tablet by mouth daily.   cyclobenzaprine (FLEXERIL) 5 MG tablet Take 5 mg by mouth daily as needed for muscle spasms.   famotidine (PEPCID) 20 MG tablet Take 20 mg by mouth 2 (two) times daily as needed for heartburn or indigestion.   FLUoxetine (PROZAC) 10 MG capsule Take 10 mg by mouth daily.   GLUTAMINE PO Take 1 tablet by mouth daily.   ibuprofen (ADVIL) 600 MG tablet Take 1 tablet (600 mg total) by mouth every 6 (six) hours as needed for moderate pain or cramping.   ibuprofen (ADVIL) 800 MG tablet Take 800 mg by mouth every 8 (eight) hours as needed for headache, moderate pain or cramping.  MAGNESIUM GLUCONATE PO Take 1 tablet by mouth daily.   methocarbamol (ROBAXIN) 500 MG tablet Take 1 tablet (500 mg total) by mouth 2 (two) times daily.   senna-docusate (SENOKOT-S) 8.6-50 MG tablet Take 2 tablets by mouth at bedtime as needed for mild constipation or moderate constipation.    Objective: BP (!) 130/92   Pulse 85   Ht 5\' 4"  (1.626 m)   Wt 173 lb (78.5 kg)   LMP 05/10/2022   BMI 29.70 kg/m   Physical Exam:  General: Alert and oriented. and No acute distress. Gait: Left sided antalgic gait.  Patient left hip demonstrates no deformity.  She has smooth and painless internal and external rotation of the left hip.  Tenderness to palpation over the left hip flexor.  She also is tenderness to  palpation over the greater trochanter on the left.  Pain in the anterior hip with resisted leg extension.  She also has pain over the anterior hip with resisted hip flexion.  Toes warm and well-perfused.  Sensation is intact to the left lower extremity.   IMAGING: I personally ordered and reviewed the following images  AP pelvis was obtained in clinic today.  No acute injuries noted.  Bilateral hip joints are located.  Minimal degenerative changes.  No evidence of avascular necrosis.  No bony lesions.  Impression: Negative AP pelvis   New Medications:  No orders of the defined types were placed in this encounter.     Oliver Barre, MD  05/20/2022 5:15 PM

## 2022-05-22 NOTE — Progress Notes (Signed)
05/23/2022 Jamie Lutz [id number] [birth year]  Referring provider: Ernest Mallick, PA* Primary GI doctor: Dr. Adela Lank  ASSESSMENT AND PLAN:   27 year old female, self-pay, multitude of symptoms worsening over the past 4 weeks, brought to 4 of her 5 kids with her. Patient complains of postprandial epigastric abdominal pain, GERD, weight loss, constipation with rectal bleeding. negative H. Pylori breath test with primary care last week  CT abdomen pelvis, CTA chest unremarkable With multitude of GI symptoms including rectal bleeding and weight loss and postprandial abdominal pain we will plan on endoscopy and colonoscopy with Dr. Adela Lank to evaluate for gastritis, esophagitis, IBD, polyps.  Patient is self-pay, given paperwork for patient assistance with [facility]. Will start on Protonix 40 mg once daily, GERD information given. Started on fiber supplement Continue follow-up with other specialties Continue Prozac, if: EGD negative, possible IBS versus pelvic floor with recent birth of her fifth child. Can also consider trial of xiphaxin.   Patient Care Team: Vladimir Faster as PCP - General (Physician Assistant)  HISTORY OF PRESENT ILLNESS: 27 y.o. self pay female with a past medical history of gestational diabetes, anxiety, depression, reflux, constipation and others listed below presents for evaluation of multitude of symptoms.   11/14/2021 patient did have Hgb 9.7 related to patient's birth of her 5th child this improved 05/05/2022 CT angio with chest pain and shortness of breath labs reviewed show no leukocytosis no anemia 05/08/2022 CT abdomen pelvis with contrast for acute abdominal pain showed no acute abnormality or explanation for abdominal pain a tiny fat-containing umbilical hernia, normal liver normal gallbladder normal bleeding Reviewing patient's chart she has been appearing to have palpitations, anxiety, body aches, negative RF, CRP and sed rate.   Negative breath test with PCP last Friday.   Recently started on Prozac 2024 evaluation by cardiology getting cardiac echo and 3-day Holter getting at the end of the month, gets palpitations with AB pain.  Cardiology suggested to consider neurology evaluation or rheumatology evaluation for left-sided weakness  Patient has had over a month of AB pain, feeling weaker and more fatigue.  She is having mainly RUQ AB pain and epigastric pain, worse post prandial.  Has had weight loss 10 lbs in last 2 weeks due to fear of AB pain after eating. She had negative H pylori breath test last week with PCP.Marland Kitchen  She has been having BRB blood in the stools for 2-3 weeks.  She was very constipation, felt need to have BM but could not, would have "blobs" of stools mixed with blood.  No rectal pain but can have rare sharp rectal pain.  She has nausea, denies vomiting.  She has had GERD, started on pepcid several weeks ago, has had some dysphagia and has throat burning.  States she is used to being tired but this was different.  Neurologist is for gapabentin.   She denies blood thinner use.  She denies NSAID use, ibuprofen makes her stomach hurt worse.  She denies ETOH use.   She denies tobacco use.  She denies drug use.    She  reports that she has never smoked. She has never used smokeless tobacco. She reports that she does not drink alcohol and does not use drugs.  RELEVANT LABS AND IMAGING: CBC    Component Value Date/Time   WBC 6.0 05/15/2022 0914   RBC 4.72 05/15/2022 0914   HGB 13.2 05/15/2022 0914   HGB 11.1 10/18/2021 0833   HCT 40.8 05/15/2022 0914  HCT 33.7 (L) 10/18/2021 0833   PLT 212 05/15/2022 0914   PLT 184 10/18/2021 0833   MCV 86.4 05/15/2022 0914   MCV 84 10/18/2021 0833   MCH 28.0 05/15/2022 0914   MCHC 32.4 05/15/2022 0914   RDW 13.7 05/15/2022 0914   RDW 13.1 10/18/2021 0833   LYMPHSABS 2.6 05/08/2022 2118   LYMPHSABS 1.6 10/30/2017 1224   MONOABS 0.6 05/08/2022 2118    EOSABS 0.0 05/08/2022 2118   EOSABS 0.5 (H) 10/30/2017 1224   BASOSABS 0.1 05/08/2022 2118   BASOSABS 0.1 10/30/2017 1224   Recent Labs    10/18/21 0833 11/14/21 0520 05/05/22 1115 05/08/22 2118 05/15/22 0914  HGB 11.1 9.7* 13.7 13.3 13.2    CMP     Component Value Date/Time   NA 138 05/15/2022 0914   K 3.9 05/15/2022 0914   CL 106 05/15/2022 0914   CO2 24 05/15/2022 0914   GLUCOSE 110 (H) 05/15/2022 0914   BUN 6 05/15/2022 0914   CREATININE 0.65 05/15/2022 0914   CREATININE 0.63 12/08/2014 1114   CALCIUM 9.0 05/15/2022 0914   PROT 7.8 05/15/2022 0914   ALBUMIN 4.2 05/15/2022 0914   AST 18 05/15/2022 0914   ALT 17 05/15/2022 0914   ALKPHOS 61 05/15/2022 0914   BILITOT 0.6 05/15/2022 0914   GFRNONAA >60 05/15/2022 0914   GFRAA >60 05/21/2019 0656      Latest Ref Rng & Units 05/15/2022    9:14 AM 05/08/2022    9:18 PM 05/21/2019    6:56 AM  Hepatic Function  Total Protein 6.5 - 8.1 g/dL 7.8  7.5  7.4   Albumin 3.5 - 5.0 g/dL 4.2  4.2  4.2   AST 15 - 41 U/L 18  20  18    ALT 0 - 44 U/L 17  24  15    Alk Phosphatase 38 - 126 U/L 61  71  66   Total Bilirubin 0.3 - 1.2 mg/dL 0.6  0.6  0.5       Current Medications:      Current Outpatient Medications (Analgesics):    acetaminophen (TYLENOL) 500 MG tablet, Take 2 tablets (1,000 mg total) by mouth every 6 (six) hours as needed for mild pain or moderate pain (for pain scale < 4).   Current Outpatient Medications (Other):    Cholecalciferol (VITAMIN D-3 PO), Take 1 tablet by mouth daily.   dicyclomine (BENTYL) 10 MG capsule, Take 10 mg by mouth as needed.   famotidine (PEPCID) 20 MG tablet, Take 20 mg by mouth 2 (two) times daily as needed for heartburn or indigestion.   FLUoxetine (PROZAC) 10 MG capsule, Take 10 mg by mouth daily.   Na Sulfate-K Sulfate-Mg Sulf 17.5-3.13-1.6 GM/177ML SOLN, Take 1 kit by mouth once for 1 dose.   pantoprazole (PROTONIX) 40 MG tablet, Take 1 tablet (40 mg total) by mouth daily.    gabapentin (NEURONTIN) 100 MG capsule, Take by mouth. (Patient not taking: Reported on 05/23/2022)   MAGNESIUM GLUCONATE PO, Take 1 tablet by mouth daily. (Patient not taking: Reported on 05/23/2022)   methocarbamol (ROBAXIN) 500 MG tablet, Take 1 tablet (500 mg total) by mouth 2 (two) times daily. (Patient not taking: Reported on 05/23/2022)  Medical History:  Past Medical History:  Diagnosis Date   Anxiety    Depression    Gestational diabetes    Allergies: No Known Allergies   Surgical History:  She  has a past surgical history that includes Perineal laceration repair (04/17/2011). Family History:  Her family history includes Diabetes in her maternal grandmother; Esophageal cancer in her maternal grandfather; Luiz Blare' disease in her mother; Hypothyroidism in her mother and sister; Other in her maternal uncle.  REVIEW OF SYSTEMS  : All other systems reviewed and negative except where noted in the History of Present Illness.  PHYSICAL EXAM: BP 110/78 (BP Location: Left Arm, Patient Position: Sitting, Cuff Size: Normal)   Pulse 88   Ht 5\' 4"  (1.626 m) Comment: height measured without shoes  Wt 172 lb 8 oz (78.2 kg)   LMP 05/10/2022   Breastfeeding No   BMI 29.61 kg/m  General Appearance: Well nourished, in no apparent distress. Head:   Normocephalic and atraumatic. Eyes:  sclerae anicteric,conjunctive pink  Respiratory: Respiratory effort normal, BS equal bilaterally without rales, rhonchi, wheezing. Cardio: RRR with no MRGs. Peripheral pulses intact.  Abdomen: Soft,  Obese ,active bowel sounds. mild tenderness in the epigastrium. Without guarding and Without rebound. No masses. Rectal: Not evaluated Musculoskeletal: Full ROM, Normal gait. Without edema. Skin:  Dry and intact without significant lesions or rashes Neuro: Alert and  oriented x4;  No focal deficits. Psych:  Cooperative. Normal mood and affect.    Doree Albee, PA-C 12:55 PM

## 2022-05-23 ENCOUNTER — Encounter: Payer: Self-pay | Admitting: Physician Assistant

## 2022-05-23 ENCOUNTER — Ambulatory Visit (INDEPENDENT_AMBULATORY_CARE_PROVIDER_SITE_OTHER): Payer: Self-pay | Admitting: Physician Assistant

## 2022-05-23 VITALS — BP 110/78 | HR 88 | Ht 64.0 in | Wt 172.5 lb

## 2022-05-23 DIAGNOSIS — K219 Gastro-esophageal reflux disease without esophagitis: Secondary | ICD-10-CM

## 2022-05-23 DIAGNOSIS — R202 Paresthesia of skin: Secondary | ICD-10-CM

## 2022-05-23 DIAGNOSIS — R634 Abnormal weight loss: Secondary | ICD-10-CM

## 2022-05-23 DIAGNOSIS — R1084 Generalized abdominal pain: Secondary | ICD-10-CM

## 2022-05-23 DIAGNOSIS — K5900 Constipation, unspecified: Secondary | ICD-10-CM

## 2022-05-23 DIAGNOSIS — R110 Nausea: Secondary | ICD-10-CM

## 2022-05-23 MED ORDER — PANTOPRAZOLE SODIUM 40 MG PO TBEC: 40.0000 mg | DELAYED_RELEASE_TABLET | Freq: Every day | ORAL | 3 refills | Status: AC

## 2022-05-23 MED ORDER — NA SULFATE-K SULFATE-MG SULF 17.5-3.13-1.6 GM/177ML PO SOLN: 1.0000 | Freq: Once | ORAL | 0 refills | Status: AC

## 2022-05-23 NOTE — Progress Notes (Signed)
Agree with assessment / plan as outlined.  

## 2022-05-23 NOTE — Patient Instructions (Addendum)
You have been scheduled for a colonoscopy/ egd. Please follow written instructions given to you at your visit today.  Please pick up your prep supplies at the pharmacy within the next 1-3 days. If you use inhalers (even only as needed), please bring them with you on the day of your procedure.   Please take your proton pump inhibitor medication, protonix 40 mg once daily, can stop the pepcid Please take this medication 30 minutes to 1 hour before meals- this makes it more effective.  Avoid spicy and acidic foods Avoid fatty foods Limit your intake of coffee, tea, alcohol, and carbonated drinks Work to maintain a healthy weight Keep the head of the bed elevated at least 3 inches with blocks or a wedge pillow if you are having any nighttime symptoms Stay upright for 2 hours after eating Avoid meals and snacks three to four hours before bedtime  Here some information about pelvic floor dysfunction. This may be contributing to some of your symptoms. We will continue with our evaluation but I do want you to consider adding on fiber supplement with low-dose MiraLAX daily. We could also refer to pelvic floor physical therapy.   Pelvic Floor Dysfunction, Female Pelvic floor dysfunction (PFD) is a condition that results when the group of muscles and connective tissues that support the organs in the pelvis (pelvic floor muscles) do not work well. These muscles and their connections form a sling that supports the colon and bladder. In women, they also support the uterus. PFD causes pelvic floor muscles to be too weak, too tight, or both. In PFD, muscle movements are not coordinated. This may cause bowel or bladder problems. It may also cause pain. What are the causes? This condition may be caused by an injury to the pelvic area or by a weakening of pelvic muscles. This often results from pregnancy and childbirth or other types of strain. In many cases, the exact cause is not known. What increases the  risk? The following factors may make you more likely to develop this condition: Having chronic bladder tissue inflammation (interstitial cystitis). Being an older person. Being overweight. History of radiation treatment for cancer in the pelvic region. Previous pelvic surgery, such as removal of the uterus (hysterectomy). What are the signs or symptoms? Symptoms of this condition vary and may include: Bladder symptoms, such as: Trouble starting urination and emptying the bladder. Frequent urinary tract infections. Leaking urine when coughing, laughing, or exercising (stress incontinence). Having to pass urine urgently or frequently. Pain when passing urine. Bowel symptoms, such as: Constipation. Urgent or frequent bowel movements. Incomplete bowel movements. Painful bowel movements. Leaking stool or gas. Unexplained genital or rectal pain. Genital or rectal muscle spasms. Low back pain. Other symptoms may include: A heavy, full, or aching feeling in the vagina. A bulge that protrudes into the vagina. Pain during or after sex. How is this diagnosed? This condition may be diagnosed based on: Your symptoms and medical history. A physical exam. During the exam, your health care provider may check your pelvic muscles for tightness, spasm, pain, or weakness. This may include a rectal exam and a pelvic exam. In some cases, you may have diagnostic tests, such as: Electrical muscle function tests. Urine flow testing. X-ray tests of bowel function. Ultrasound of the pelvic organs. How is this treated? Treatment for this condition depends on the symptoms. Treatment options include: Physical therapy. This may include Kegel exercises to help relax or strengthen the pelvic floor muscles. Biofeedback. This type of  therapy provides feedback on how tight your pelvic floor muscles are so that you can learn to control them. Internal or external massage therapy. A treatment that involves  electrical stimulation of the pelvic floor muscles to help control pain (transcutaneous electrical nerve stimulation, or TENS). Sound wave therapy (ultrasound) to reduce muscle spasms. Medicines, such as: Muscle relaxants. Bladder control medicines. Surgery to reconstruct or support pelvic floor muscles may be an option if other treatments do not help. Follow these instructions at home: Activity Do your usual activities as told by your health care provider. Ask your health care provider if you should modify any activities. Do pelvic floor strengthening or relaxing exercises at home as told by your physical therapist. Lifestyle Maintain a healthy weight. Eat foods that are high in fiber, such as beans, whole grains, and fresh fruits and vegetables. Limit foods that are high in fat and processed sugars, such as fried or sweet foods. Manage stress with relaxation techniques such as yoga or meditation. General instructions If you have problems with leakage: Use absorbable pads or wear padded underwear. Wash frequently with mild soap. Keep your genital and anal area as clean and dry as possible. Ask your health care provider if you should try a barrier cream to prevent skin irritation. Take warm baths to relieve pelvic muscle tension or spasms. Take over-the-counter and prescription medicines only as told by your health care provider. Keep all follow-up visits. How is this prevented? The cause of PFD is not always known, but there are a few things you can do to reduce the risk of developing this condition, including: Staying at a healthy weight. Getting regular exercise. Managing stress. Contact a health care provider if: Your symptoms are not improving with home care. You have signs or symptoms of PFD that get worse at home. You develop new signs or symptoms. You have signs of a urinary tract infection, such as: Fever. Chills. Increased urinary frequency. A burning feeling when  urinating. You have not had a bowel movement in 3 days (constipation). Summary Pelvic floor dysfunction results when the muscles and connective tissues in your pelvic floor do not work well. These muscles and their connections form a sling that supports your colon and bladder. In women, they also support the uterus. PFD may be caused by an injury to the pelvic area or by a weakening of pelvic muscles. PFD causes pelvic floor muscles to be too weak, too tight, or a combination of both. Symptoms may vary from person to person. In most cases, PFD can be treated with physical therapies and medicines. Surgery may be an option if other treatments do not help. This information is not intended to replace advice given to you by your health care provider. Make sure you discuss any questions you have with your health care provider. Document Revised: 05/16/2020 Document Reviewed: 05/16/2020 Elsevier Patient Education  2022 [employer].

## 2022-06-20 ENCOUNTER — Other Ambulatory Visit: Payer: Self-pay

## 2022-06-20 ENCOUNTER — Ambulatory Visit (HOSPITAL_COMMUNITY): Payer: Self-pay | Attending: Orthopedic Surgery | Admitting: Physical Therapy

## 2022-06-20 DIAGNOSIS — M6281 Muscle weakness (generalized): Secondary | ICD-10-CM | POA: Insufficient documentation

## 2022-06-20 DIAGNOSIS — M25552 Pain in left hip: Secondary | ICD-10-CM | POA: Insufficient documentation

## 2022-06-20 DIAGNOSIS — M76892 Other specified enthesopathies of left lower limb, excluding foot: Secondary | ICD-10-CM | POA: Insufficient documentation

## 2022-06-20 DIAGNOSIS — M7062 Trochanteric bursitis, left hip: Secondary | ICD-10-CM | POA: Insufficient documentation

## 2022-06-20 NOTE — Therapy (Signed)
OUTPATIENT PHYSICAL THERAPY LOWER EXTREMITY EVALUATION   Patient Name: Jamie Lutz MRN: [id number] DOB:1995-04-13, 27 y.o., female Today's Date: 06/20/2022  END OF SESSION:  PT End of Session - 06/20/22 1050     Visit Number 1    Number of Visits 4    Date for PT Re-Evaluation 07/18/22    Authorization Type 100% discount    Progress Note Due on Visit 4    PT Start Time 0945    PT Stop Time 1020    PT Time Calculation (min) 35 min             Past Medical History:  Diagnosis Date   Anxiety    Depression    Gestational diabetes    Past Surgical History:  Procedure Laterality Date   PERINEAL LACERATION REPAIR  04/17/2011   Procedure: SUTURE REPAIR PERINEAL LACERATION;  Surgeon: Tilda Burrow, MD;  Location: [facility];  Service: Gynecology;  Laterality: N/A;   Patient Active Problem List   Diagnosis Date Noted   History of preterm delivery 11/14/2021   Depression with anxiety 06/05/2016   History of gestational diabetes 02/25/2016   Adjustment disorder with anxious mood 11/03/2014   Myofascial abdominal wall pain 05/03/2014   Chronic abdominal pain 05/03/2014   Constipation 05/03/2014   Paresthesia 02/06/2014    PCP: Chyrl Civatte   REFERRING PROVIDER: Oliver Barre, MD  REFERRING DIAG: osis M25.552 (ICD-10-CM) - Pain in left hip [phone] (ICD-10-CM) - Tendinitis of left hip flexor M70.62 (ICD-10-CM) - Greater trochanteric bursitis of left  THERAPY DIAG:  Left hip pain Muscle weakness  Rationale for Evaluation and Treatment: Rehabilitation  ONSET DATE: 11/12/22     SUBJECTIVE STATEMENT: Pt states that she has been having pain from her RT hip down into her anterior thigh ever since she had her baby. The patient states that he pain is constant.  She states that she is unable to lie on her right side.  She has been going to a Land.    PERTINENT HISTORY: Unremarkable  PAIN:  Are you having pain? Yes: NPRS scale: 6/10, max is 8/10 Pain  location: Rt hip  Pain description: aching  Aggravating factors: not sure  Relieving factors: not sure   PRECAUTIONS: None  WEIGHT BEARING RESTRICTIONS: No  FALLS:  Has patient fallen in last 6 months? No  LIVING ENVIRONMENT: Lives with: lives with their family Lives in: House/apartment OCCUPATION: does not work   PLOF: Independent  PATIENT GOALS: Less hip pain   NEXT MD VISIT: no scheduled visit   OBJECTIVE:   DIAGNOSTIC FINDINGS: Impression: Negative AP pelvis   COGNITION: Overall cognitive status: Within functional limits for tasks assessed       POSTURE: No Significant postural limitations Rt hip is high; PSIS:  Rt higher; ASIS lower indicative of SI Dysfunction. Supine to long sit test: supine Rt leg long; long sit shorter indicative of SI dysfunction.  PALPATION: Tender   LOWER EXTREMITY ROM: WFL   LOWER EXTREMITY MMT:  MMT Right eval Left eval  Hip flexion 4/5 4/5  Hip extension 3+/5 3+/5  Hip abduction 4/5 4/5  Hip adduction    Hip internal rotation    Hip external rotation    Knee flexion 3+/5 3+/5  Knee extension    Ankle dorsiflexion    Ankle plantarflexion    Ankle inversion    Ankle eversion     (Blank rows = not tested)   TODAY'S TREATMENT:  DATE: 06/20/2022:  Evaluation Mm energy techniques to improve SI dysfunction - Standing Heel Raises   reps - 5 x5" hold - Mini Squat with Counter Support  5x 5" hold - Sit to Stand  - 5reps - Supine Bridge  -5 - 5" hold - Prone Hip Extension  - 10 5" hold      PATIENT EDUCATION:  Education details: HEP Person educated: Patient Education method: Handouts Education comprehension: verbalized understanding  HOME EXERCISE PROGRAM: Access Code: [email or web address] URL: [email or web address] Date: 06/20/2022 Prepared by: Virgina Organ  Exercises - Standing Heel  Raises  - 2 x daily - 7 x weekly - 1 sets - 10 reps - 5" hold - Mini Squat with Counter Support  - 2 x daily - 7 x weekly - 1 sets - 10 reps - 5" hold - Sit to Stand  - 2 x daily - 7 x weekly - 1 sets - 10 reps - Supine Bridge  - 2 x daily - 7 x weekly - 1 sets - 10 reps - 5" hold - Prone Hip Extension  - 2 x daily - 7 x weekly - 1 sets - 10 reps - 5" hold  ASSESSMENT:  CLINICAL IMPRESSION: Patient is a 27 y.o. female who was seen today for physical therapy evaluation and treatment for Left hip pain. 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  [name] will benefit from skilled PT to address these issues and decrease her pain to improve her functioning ability.   OBJECTIVE IMPAIRMENTS: decreased balance, difficulty walking, decreased strength, and pain.   ACTIVITY LIMITATIONS: carrying, lifting, squatting, stairs, and locomotion level  PARTICIPATION LIMITATIONS: cleaning, shopping, and community activity   REHAB POTENTIAL: Good  CLINICAL DECISION MAKING: Stable/uncomplicated  EVALUATION COMPLEXITY: Low   GOALS: Goals reviewed with patient? No  SHORT TERM GOALS: Target date: 07/04/22 PT to be I in HEP in order to decrease her Lt hip pain to no greater than a 4/10 Baseline: Goal status: INITIAL  2.  Pt Lt hip and core mm to be improved by 1/2 grade complete her housework without  increased pain. Baseline:  Goal status: INITIAL  3.  PT SI to be in correct alignment Baseline:  Goal status: INITIAL   LONG TERM GOALS: Target date: 08/20/22  PT to be I in HEP in an advanced HEP in order to decrease her Lt hip pain to no greater than a 2/10 Baseline:  Goal status: INITIAL  2.   Pt  hip and core  mm to be improved by 1 grade to be able to complete a full day activity without having to take a break. Baseline:  Goal status: INITIAL  3.  PT to be able to lie on her right side without increased pain for sleeping. Baseline:   Goal status: INITIAL   PLAN:  PT FREQUENCY: 1x/week  PT DURATION: 4 weeks  PLANNED INTERVENTIONS: Therapeutic exercises, Balance training, Patient/Family education, Self Care, and Manual therapy  PLAN FOR NEXT SESSION:  check SI, begin single leg stance, wall squat, quadriped exercises and give as a HEP as pt is coming once a week.    Virgina Organ, PT CLT [phone]  06/20/2022, 10:51 AM

## 2022-06-27 ENCOUNTER — Encounter (HOSPITAL_COMMUNITY): Payer: Self-pay

## 2022-06-27 ENCOUNTER — Ambulatory Visit (HOSPITAL_COMMUNITY): Payer: Self-pay | Attending: Orthopedic Surgery

## 2022-06-27 DIAGNOSIS — M6281 Muscle weakness (generalized): Secondary | ICD-10-CM | POA: Insufficient documentation

## 2022-06-27 DIAGNOSIS — M25552 Pain in left hip: Secondary | ICD-10-CM | POA: Insufficient documentation

## 2022-06-27 NOTE — Therapy (Signed)
OUTPATIENT PHYSICAL THERAPY LOWER EXTREMITY TREATMENT   Patient Name: Jamie Lutz MRN: [id number] DOB:13-Mar-1995, 27 y.o., female Today's Date: 06/27/2022  END OF SESSION:  PT End of Session - 06/27/22 1217     Visit Number 2    Number of Visits 4    Date for PT Re-Evaluation 07/18/22    Authorization Type 100% discount    Progress Note Due on Visit 4    PT Start Time 1144   late sign in   PT Stop Time 1215    PT Time Calculation (min) 31 min    Activity Tolerance Patient tolerated treatment well    Behavior During Therapy [facility] for tasks assessed/performed              Past Medical History:  Diagnosis Date   Anxiety    Depression    Gestational diabetes    Past Surgical History:  Procedure Laterality Date   PERINEAL LACERATION REPAIR  04/17/2011   Procedure: SUTURE REPAIR PERINEAL LACERATION;  Surgeon: Tilda Burrow, MD;  Location: [facility];  Service: Gynecology;  Laterality: N/A;   Patient Active Problem List   Diagnosis Date Noted   History of preterm delivery 11/14/2021   Depression with anxiety 06/05/2016   History of gestational diabetes 02/25/2016   Adjustment disorder with anxious mood 11/03/2014   Myofascial abdominal wall pain 05/03/2014   Chronic abdominal pain 05/03/2014   Constipation 05/03/2014   Paresthesia 02/06/2014    PCP: Chyrl Civatte   REFERRING PROVIDER: Oliver Barre, MD  REFERRING DIAG: osis M25.552 (ICD-10-CM) - Pain in left hip [phone] (ICD-10-CM) - Tendinitis of left hip flexor M70.62 (ICD-10-CM) - Greater trochanteric bursitis of left  THERAPY DIAG:  Left hip pain Muscle weakness  Rationale for Evaluation and Treatment: Rehabilitation  ONSET DATE: 11/12/22     SUBJECTIVE STATEMENT: 06/27/22:  Pt stated she has pain from her Lt hip down to medial and lateral pain to heel, pain scale 7/10.  Pt states that she has been having pain from her RT hip down into her anterior thigh ever since she had her baby. The patient  states that he pain is constant.  She states that she is unable to lie on her right side.  She has been going to a [place].    PERTINENT HISTORY: Unremarkable  PAIN:  Are you having pain? Yes: current 7/10.  NPRS scale: 6/10, max is 8/10 Pain location: Rt hip  Pain description: aching  Aggravating factors: not sure  Relieving factors: not sure   PRECAUTIONS: None  WEIGHT BEARING RESTRICTIONS: No  FALLS:  Has patient fallen in last 6 months? No  LIVING ENVIRONMENT: Lives with: lives with their family Lives in: House/apartment OCCUPATION: does not work   PLOF: Independent  PATIENT GOALS: Less hip pain   NEXT MD VISIT: no scheduled visit   OBJECTIVE:   DIAGNOSTIC FINDINGS: Impression: Negative AP pelvis   COGNITION: Overall cognitive status: Within functional limits for tasks assessed       POSTURE: No Significant postural limitations Rt hip is high; PSIS:  Rt higher; ASIS lower indicative of SI Dysfunction. Supine to long sit test: supine Rt leg long; long sit shorter indicative of SI dysfunction.  PALPATION: Tender   LOWER EXTREMITY ROM: WFL   LOWER EXTREMITY MMT:  MMT Right eval Left eval  Hip flexion 4/5 4/5  Hip extension 3+/5 3+/5  Hip abduction 4/5 4/5  Hip adduction    Hip internal rotation    Hip  external rotation    Knee flexion 3+/5 3+/5  Knee extension    Ankle dorsiflexion    Ankle plantarflexion    Ankle inversion    Ankle eversion     (Blank rows = not tested)   TODAY'S TREATMENT:                                                                                                                              DATE:  06/27/22: Reviewed goals Educated importance of HEP compliance Checked SI with MET to improve alignement Pubic clearing exercises: Bridges Isometric abd/ add alternating with ball and belt 10x5 " holds  Quadruped UE 5x 5" LE 3x 5"    06/20/2022:  Evaluation Mm energy techniques to improve SI dysfunction -  Standing Heel Raises   reps - 5 x5" hold - Mini Squat with Counter Support  5x 5" hold - Sit to Stand  - 5reps - Supine Bridge  -5 - 5" hold - Prone Hip Extension  - 10 5" hold      PATIENT EDUCATION:  Education details: HEP Person educated: Patient Education method: Handouts Education comprehension: verbalized understanding  HOME EXERCISE PROGRAM: Access Code: [email or web address] URL: [email or web address] Date: 06/20/2022 Prepared by: Virgina Organ  Exercises - Standing Heel Raises  - 2 x daily - 7 x weekly - 1 sets - 10 reps - 5" hold - Mini Squat with Counter Support  - 2 x daily - 7 x weekly - 1 sets - 10 reps - 5" hold - Sit to Stand  - 2 x daily - 7 x weekly - 1 sets - 10 reps - Supine Bridge  - 2 x daily - 7 x weekly - 1 sets - 10 reps - 5" hold - Prone Hip Extension  - 2 x daily - 7 x weekly - 1 sets - 10 reps - 5" hold  ASSESSMENT:  CLINICAL IMPRESSION: 06/27/22:  Late arrival so unable to complete full POC today.  Began session by reviewing goals and educated importance of HEP compliance.  Pt able to recall current exercise program.  Began session with MET for proper SI alignment followed by core and proximal strengthening to assist with alignment.  Reports of pain reduced and radicular symptoms resolved.  Therex focus on pubic clearing and core stability exercises.  Pt with multimodal cueing and presents with instability during quadruped exercises to reduce lumbar curvature and rotation with exercise.  EOS reports of pain resolved.    Patient is a 27 y.o. female who was seen today for physical therapy evaluation and treatment for Left hip pain. 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  Ms Jamie Lutz will benefit from skilled PT to address these issues and decrease her pain to improve her functioning ability.   OBJECTIVE IMPAIRMENTS: decreased balance, difficulty walking, decreased strength, and pain.   ACTIVITY  LIMITATIONS: carrying, lifting, squatting, stairs, and locomotion level  PARTICIPATION  LIMITATIONS: cleaning, shopping, and community activity   REHAB POTENTIAL: Good  CLINICAL DECISION MAKING: Stable/uncomplicated  EVALUATION COMPLEXITY: Low   GOALS: Goals reviewed with patient? No  SHORT TERM GOALS: Target date: 07/04/22 PT to be I in HEP in order to decrease her Lt hip pain to no greater than a 4/10 Baseline: Goal status: IN PROGRESS  2.  Pt Lt hip and core mm to be improved by 1/2 grade complete her housework without  increased pain. Baseline:  Goal status: IN PROGRESS  3.  PT SI to be in correct alignment Baseline:  Goal status: IN PROGRESS   LONG TERM GOALS: Target date: 08/20/22  PT to be I in HEP in an advanced HEP in order to decrease her Lt hip pain to no greater than a 2/10 Baseline:  Goal status: IN PROGRESS  2.   Pt  hip and core  mm to be improved by 1 grade to be able to complete a full day activity without having to take a break. Baseline:  Goal status: IN PROGRESS  3.  PT to be able to lie on her right side without increased pain for sleeping. Baseline: 06/27/22:  discomfort in Left side Goal status: IN PROGRESS   PLAN:  PT FREQUENCY: 1x/week  PT DURATION: 4 weeks  PLANNED INTERVENTIONS: Therapeutic exercises, Balance training, Patient/Family education, Self Care, and Manual therapy  PLAN FOR NEXT SESSION:  check SI, begin single leg stance, wall squat, quadriped exercises and give as a HEP as pt is coming once a week.    Becky Sax, LPTA/CLT; CBIS [phone]  Juel Burrow, PTA 06/27/2022, 4:53 PM

## 2022-06-30 ENCOUNTER — Encounter (HOSPITAL_COMMUNITY): Payer: Self-pay

## 2022-07-08 ENCOUNTER — Ambulatory Visit (AMBULATORY_SURGERY_CENTER): Payer: Self-pay | Admitting: Gastroenterology

## 2022-07-08 ENCOUNTER — Encounter: Payer: Self-pay | Admitting: Gastroenterology

## 2022-07-08 VITALS — BP 101/63 | HR 71 | Temp 98.9°F | Resp 12 | Ht 64.0 in | Wt 172.0 lb

## 2022-07-08 DIAGNOSIS — R634 Abnormal weight loss: Secondary | ICD-10-CM

## 2022-07-08 DIAGNOSIS — K2950 Unspecified chronic gastritis without bleeding: Secondary | ICD-10-CM

## 2022-07-08 DIAGNOSIS — K219 Gastro-esophageal reflux disease without esophagitis: Secondary | ICD-10-CM

## 2022-07-08 DIAGNOSIS — K625 Hemorrhage of anus and rectum: Secondary | ICD-10-CM

## 2022-07-08 DIAGNOSIS — R1084 Generalized abdominal pain: Secondary | ICD-10-CM

## 2022-07-08 DIAGNOSIS — K649 Unspecified hemorrhoids: Secondary | ICD-10-CM

## 2022-07-08 MED ORDER — SODIUM CHLORIDE 0.9 % IV SOLN: 500.0000 mL | Freq: Once | INTRAVENOUS | Status: DC

## 2022-07-08 NOTE — Progress Notes (Signed)
Called to room to assist during endoscopic procedure.  Patient ID and intended procedure confirmed with present staff. Received instructions for my participation in the procedure from the performing physician.  

## 2022-07-08 NOTE — Progress Notes (Signed)
Vss nad trans to pacu 

## 2022-07-08 NOTE — Progress Notes (Signed)
[facility] History and Physical   Primary Care Physician:  Vladimir Faster   Reason for Procedure:   Abdominal pain, GERD, weight loss, rectal bleeding  Plan:    EGD and colonoscopy     HPI: Jamie Lutz is a 27 y.o. female here for EGD and colonoscopy to evaluate symptoms as outlined. See clinic note from 05/23/22 for details - on protonix 40mg  daily since the office visit. She reports feeling better on protonix since the office visit but still having pain. History of constipation and rectal bleeding, improved since office visit as well. Otherwise feels well without any cardiopulmonary symptoms.   I have discussed risks / benefits of anesthesia and endoscopic procedure with Theodora Blow and they wish to proceed with the exams as outlined today.    Past Medical History:  Diagnosis Date   Anxiety    Depression    GERD (gastroesophageal reflux disease)    Gestational diabetes     Past Surgical History:  Procedure Laterality Date   COLONOSCOPY     PERINEAL LACERATION REPAIR  04/17/2011   Procedure: SUTURE REPAIR PERINEAL LACERATION;  Surgeon: Tilda Burrow, MD;  Location: [facility];  Service: Gynecology;  Laterality: N/A;   UPPER GASTROINTESTINAL ENDOSCOPY      Prior to Admission medications   Medication Sig Start Date End Date Taking? Authorizing Provider  Cholecalciferol (VITAMIN D-3 PO) Take 1 tablet by mouth daily.   Yes [provider]  pantoprazole (PROTONIX) 40 MG tablet Take 1 tablet (40 mg total) by mouth daily. 05/23/22  Yes Doree Albee, PA-C  acetaminophen (TYLENOL) 500 MG tablet Take 2 tablets (1,000 mg total) by mouth every 6 (six) hours as needed for mild pain or moderate pain (for pain scale < 4). 11/15/21   Anyanwu, Jethro Bastos, MD  Azelastine HCl 137 MCG/SPRAY SOLN Place 1 spray into both nostrils 2 (two) times daily. Patient not taking: Reported on 07/08/2022 05/26/22   [provider]  buPROPion (WELLBUTRIN  XL) 150 MG 24 hr tablet Take 150 mg by mouth daily. Patient not taking: Reported on 07/08/2022 07/07/22   [provider]  dicyclomine (BENTYL) 10 MG capsule Take 10 mg by mouth as needed. Patient not taking: Reported on 07/08/2022 05/09/22   [provider]  famotidine (PEPCID) 20 MG tablet Take 20 mg by mouth 2 (two) times daily as needed for heartburn or indigestion. Patient not taking: Reported on 07/08/2022    [provider]  FLUoxetine (PROZAC) 20 MG capsule Take 20 mg by mouth daily. Patient not taking: Reported on 07/08/2022 05/26/22   [provider]  gabapentin (NEURONTIN) 100 MG capsule Take by mouth. Patient not taking: Reported on 05/23/2022 05/21/22 06/20/22  [provider]  levocetirizine (XYZAL) 5 MG tablet Take 5 mg by mouth every evening. Patient not taking: Reported on 07/08/2022 05/26/22   [provider]  MAGNESIUM GLUCONATE PO Take 1 tablet by mouth daily. Patient not taking: Reported on 05/23/2022    [provider]  methocarbamol (ROBAXIN) 500 MG tablet Take 1 tablet (500 mg total) by mouth 2 (two) times daily. Patient not taking: Reported on 05/23/2022 04/14/22   Leath-WarrenSadie Haber, NP    Current Outpatient Medications  Medication Sig Dispense Refill   Cholecalciferol (VITAMIN D-3 PO) Take 1 tablet by mouth daily.     pantoprazole (PROTONIX) 40 MG tablet Take 1 tablet (40 mg total) by mouth daily. 30 tablet 3   acetaminophen (TYLENOL)  500 MG tablet Take 2 tablets (1,000 mg total) by mouth every 6 (six) hours as needed for mild pain or moderate pain (for pain scale < 4). 60 tablet 0   Azelastine HCl 137 MCG/SPRAY SOLN Place 1 spray into both nostrils 2 (two) times daily. (Patient not taking: Reported on 07/08/2022)     buPROPion (WELLBUTRIN XL) 150 MG 24 hr tablet Take 150 mg by mouth daily. (Patient not taking: Reported on 07/08/2022)     dicyclomine (BENTYL) 10 MG capsule Take 10 mg by mouth as needed. (Patient not  taking: Reported on 07/08/2022)     famotidine (PEPCID) 20 MG tablet Take 20 mg by mouth 2 (two) times daily as needed for heartburn or indigestion. (Patient not taking: Reported on 07/08/2022)     FLUoxetine (PROZAC) 20 MG capsule Take 20 mg by mouth daily. (Patient not taking: Reported on 07/08/2022)     gabapentin (NEURONTIN) 100 MG capsule Take by mouth. (Patient not taking: Reported on 05/23/2022)     levocetirizine (XYZAL) 5 MG tablet Take 5 mg by mouth every evening. (Patient not taking: Reported on 07/08/2022)     MAGNESIUM GLUCONATE PO Take 1 tablet by mouth daily. (Patient not taking: Reported on 05/23/2022)     methocarbamol (ROBAXIN) 500 MG tablet Take 1 tablet (500 mg total) by mouth 2 (two) times daily. (Patient not taking: Reported on 05/23/2022) 20 tablet 0   Current Facility-Administered Medications  Medication Dose Route Frequency Provider Last Rate Last Admin   0.9 %  sodium chloride infusion  500 mL Intravenous Once Myles Mallicoat, Willaim Rayas, MD        Allergies as of 07/08/2022   (No Known Allergies)    Family History  Problem Relation Age of Onset   Hypothyroidism Mother    Luiz Blare' disease Mother    Hypothyroidism Sister    Other Maternal Uncle        has a colostomy bag but pt don't know the diagnosis   Diabetes Maternal Grandmother    Esophageal cancer Maternal Grandfather    Colon cancer Neg Hx    Rectal cancer Neg Hx    Stomach cancer Neg Hx     Social History   Socioeconomic History   Marital status: Married    Spouse name: Jacquenette Shone   Number of children: 5   Years of education: Not on file   Highest education level: High school graduate  Occupational History   Not on file  Tobacco Use   Smoking status: Never   Smokeless tobacco: Never  Vaping Use   Vaping Use: Never used  Substance and Sexual Activity   Alcohol use: No    Alcohol/week: 0.0 standard drinks of alcohol   Drug use: No   Sexual activity: Yes    Birth control/protection: None  Other Topics  Concern   Not on file  Social History Narrative   Not on file   Social Determinants of Health   Financial Resource Strain: Medium Risk (10/30/2017)   Overall Financial Resource Strain (CARDIA)    Difficulty of Paying Living Expenses: Somewhat hard  Food Insecurity: No Food Insecurity (11/14/2021)   Hunger Vital Sign    Worried About Running Out of Food in the Last Year: Never true    Ran Out of Food in the Last Year: Never true  Transportation Needs: No Transportation Needs (11/14/2021)   PRAPARE - Administrator, Civil Service (Medical): No    Lack of Transportation (Non-Medical): No  Physical Activity:  Insufficiently Active (10/30/2017)   Exercise Vital Sign    Days of Exercise per Week: 2 days    Minutes of Exercise per Session: 30 min  Stress: Stress Concern Present (10/30/2017)   [employer] of Occupational Health - Occupational Stress Questionnaire    Feeling of Stress : Rather much  Social Connections: Moderately Integrated (10/30/2017)   Social Connection and Isolation Panel [NHANES]    Frequency of Communication with Friends and Family: More than three times a week    Frequency of Social Gatherings with Friends and Family: More than three times a week    Attends Religious Services: More than 4 times per year    Active Member of [employer] or Organizations: No    Attends Banker Meetings: Never    Marital Status: Married  Catering manager Violence: Not At Risk (11/14/2021)   Humiliation, Afraid, Rape, and Kick questionnaire    Fear of Current or Ex-Partner: No    Emotionally Abused: No    Physically Abused: No    Sexually Abused: No    Review of Systems: All other review of systems negative except as mentioned in the HPI.  Physical Exam: Vital signs BP 111/69   Pulse 79   Temp 98.9 F (37.2 C) (Temporal)   Ht 5\' 4"  (1.626 m)   Wt 172 lb (78 kg)   LMP 06/26/2022 (Exact Date)   SpO2 100%   BMI 29.52 kg/m   General:   Alert,   Well-developed, pleasant and cooperative in NAD Lungs:  Clear throughout to auscultation.   Heart:  Regular rate and rhythm Abdomen:  Soft, nontender and nondistended.   Neuro/Psych:  Alert and cooperative. Normal mood and affect. A and O x 3  Harlin Rain, MD [facility]

## 2022-07-08 NOTE — Op Note (Signed)
[facility] Patient Name: Jamie Lutz Procedure Date: 07/08/2022 8:33 AM MRN: [id number] Endoscopist: Viviann Spare P. Adela Lank , MD, [id number] Age: 27 Referring MD:  Date of Birth: 26-Apr-1995 Gender: Female Account #: [id number] Procedure:                Colonoscopy Indications:              Rectal bleeding, abdominal pain, weight loss -                            history of constipation Medicines:                Monitored Anesthesia Care Procedure:                Pre-Anesthesia Assessment:                           - Prior to the procedure, a History and Physical                            was performed, and patient medications and                            allergies were reviewed. The patient's tolerance of                            previous anesthesia was also reviewed. The risks                            and benefits of the procedure and the sedation                            options and risks were discussed with the patient.                            All questions were answered, and informed consent                            was obtained. Prior Anticoagulants: The patient has                            taken no anticoagulant or antiplatelet agents. ASA                            Grade Assessment: II - A patient with mild systemic                            disease. After reviewing the risks and benefits,                            the patient was deemed in satisfactory condition to                            undergo the procedure.  After obtaining informed consent, the colonoscope                            was passed under direct vision. Throughout the                            procedure, the patient's blood pressure, pulse, and                            oxygen saturations were monitored continuously. The                            [id number]-[id number] Colonoscope [id number] was introduced                            through the anus and advanced  to the the terminal                            ileum, with identification of the appendiceal                            orifice and IC valve. The colonoscopy was performed                            without difficulty. The patient tolerated the                            procedure well. The quality of the bowel                            preparation was good. The terminal ileum, ileocecal                            valve, appendiceal orifice, and rectum were                            photographed. Scope In: 9:00:03 AM Scope Out: 9:14:42 AM Scope Withdrawal Time: 0 hours 12 minutes 34 seconds  Total Procedure Duration: 0 hours 14 minutes 39 seconds  Findings:                 Hemorrhoids were found on perianal exam.                           The terminal ileum appeared normal.                           Internal hemorrhoids were found during                            retroflexion. The hemorrhoids were small.                           The exam was otherwise without abnormality. Complications:            No immediate complications. Estimated blood  loss:                            None. Estimated Blood Loss:     Estimated blood loss: none. Impression:               - Hemorrhoids found on perianal exam.                           - The examined portion of the ileum was normal.                           - Internal hemorrhoids.                           - The examination was otherwise normal.                           - No polyps.                           Bleeding is secondary to internal hemorrhoids in                            the setting of constipation. She reports                            improvement in symptoms since office visit. Recommendation:           - Patient has a contact number available for                            emergencies. The signs and symptoms of potential                            delayed complications were discussed with the                            patient.  Return to normal activities tomorrow.                            Written discharge instructions were provided to the                            patient.                           - Resume previous diet.                           - Continue present medications.                           - Recommend daily fiber supplement (Citrucel) once                            daily or Miralax daily to keep stools soft. Can try  Calmol4 suppositories OTC for hemorrhoidal symptoms                           - Follow up in the office for long term management Jamie Veals P. Dionisia Pacholski, MD 07/08/2022 9:19:05 AM This report has been signed electronically.

## 2022-07-08 NOTE — Progress Notes (Deleted)
Called to room to assist during endoscopic procedure.  Patient ID and intended procedure confirmed with present staff. Received instructions for my participation in the procedure from the performing physician.  

## 2022-07-08 NOTE — Patient Instructions (Addendum)
-   Recommend daily fiber supplement (Citrucel) once                            daily or Miralax daily to keep stools soft. Can try                            Calmol4 suppositories OTC for hemorrhoidal symptoms                           - Follow up in the office for long term management           YOU HAD AN ENDOSCOPIC PROCEDURE TODAY: Refer to the procedure report and other information in the discharge instructions given to you for any specific questions about what was found during the examination. If this information does not answer your questions, please call [place] office at [phone] to clarify.   YOU SHOULD EXPECT: Some feelings of bloating in the abdomen. Passage of more gas than usual. Walking can help get rid of the air that was put into your GI tract during the procedure and reduce the bloating. If you had a lower endoscopy (such as a colonoscopy or flexible sigmoidoscopy) you may notice spotting of blood in your stool or on the toilet paper. Some abdominal soreness may be present for a day or two, also.  DIET: Your first meal following the procedure should be a light meal and then it is ok to progress to your normal diet. A half-sandwich or bowl of soup is an example of a good first meal. Heavy or fried foods are harder to digest and may make you feel nauseous or bloated. Drink plenty of fluids but you should avoid alcoholic beverages for 24 hours. If you had a esophageal dilation, please see attached instructions for diet.    ACTIVITY: Your care partner should take you home directly after the procedure. You should plan to take it easy, moving slowly for the rest of the day. You can resume normal activity the day after the procedure however YOU SHOULD NOT DRIVE, use power tools, machinery or perform tasks that involve climbing or major physical exertion for 24 hours (because of the sedation medicines used during the test).   SYMPTOMS TO REPORT IMMEDIATELY: A gastroenterologist can be  reached at any hour. Please call [phone]  for any of the following symptoms:  Following lower endoscopy (colonoscopy, flexible sigmoidoscopy) Excessive amounts of blood in the stool  Significant tenderness, worsening of abdominal pains  Swelling of the abdomen that is new, acute  Fever of 100 or higher  Following upper endoscopy (EGD, EUS, ERCP, esophageal dilation) Vomiting of blood or coffee ground material  New, significant abdominal pain  New, significant chest pain or pain under the shoulder blades  Painful or persistently difficult swallowing  New shortness of breath  Black, tarry-looking or red, bloody stools  FOLLOW UP:  If any biopsies were taken you will be contacted by phone or by letter within the next 1-3 weeks. Call [phone]  if you have not heard about the biopsies in 3 weeks.  Please also call with any specific questions about appointments or follow up tests.

## 2022-07-08 NOTE — Op Note (Signed)
[facility] Patient Name: Jamie Lutz Procedure Date: 07/08/2022 8:40 AM MRN: [id number] Endoscopist: Viviann Spare P. Adela Lank , MD, [id number] Age: 27 Referring MD:  Date of Birth: 1995/07/12 Gender: Female Account #: [id number] Procedure:                Upper GI endoscopy Indications:              Epigastric abdominal pain, Follow-up of                            gastro-esophageal reflux disease, Weight loss -                            improved with pantoprazole since office visit.                            Still has some left sided abdominal pain Medicines:                Monitored Anesthesia Care Procedure:                Pre-Anesthesia Assessment:                           - Prior to the procedure, a History and Physical                            was performed, and patient medications and                            allergies were reviewed. The patient's tolerance of                            previous anesthesia was also reviewed. The risks                            and benefits of the procedure and the sedation                            options and risks were discussed with the patient.                            All questions were answered, and informed consent                            was obtained. Prior Anticoagulants: The patient has                            taken no anticoagulant or antiplatelet agents. ASA                            Grade Assessment: II - A patient with mild systemic                            disease. After reviewing the risks and benefits,  the patient was deemed in satisfactory condition to                            undergo the procedure.                           After obtaining informed consent, the endoscope was                            passed under direct vision. Throughout the                            procedure, the patient's blood pressure, pulse, and                            oxygen saturations  were monitored continuously. The                            Olympus Scope [id number] was introduced through the                            mouth, and advanced to the second part of duodenum.                            The upper GI endoscopy was accomplished without                            difficulty. The patient tolerated the procedure                            well. Scope In: Scope Out: Findings:                 The Z-line was regular.                           The exam of the esophagus was otherwise normal.                           The entire examined stomach was normal. Biopsies                            were taken with a cold forceps for Helicobacter                            pylori testing.                           The examined duodenum was normal. Biopsies for                            histology were taken with a cold forceps for                            evaluation of celiac disease. Complications:  No immediate complications. Estimated blood loss:                            Minimal. Estimated Blood Loss:     Estimated blood loss was minimal. Impression:               - Z-line regular.                           - Normal esophagus.                           - Normal stomach. Biopsied.                           - Normal examined duodenum. Biopsied.                           Normal exam - will await biopsy results. PPI has                            provided benefit. Recommendation:           - Patient has a contact number available for                            emergencies. The signs and symptoms of potential                            delayed complications were discussed with the                            patient. Return to normal activities tomorrow.                            Written discharge instructions were provided to the                            patient.                           - Resume previous diet.                           - Continue present  medications.                           - Await pathology results with further                            recommendations. Viviann Spare P. Maripat Borba, MD 07/08/2022 9:23:11 AM This report has been signed electronically.

## 2022-07-09 ENCOUNTER — Telehealth: Payer: Self-pay

## 2022-07-09 NOTE — Telephone Encounter (Signed)
Unable to leave message mailbox full.

## 2022-07-10 ENCOUNTER — Ambulatory Visit (HOSPITAL_COMMUNITY): Payer: Self-pay

## 2022-07-10 ENCOUNTER — Encounter (HOSPITAL_COMMUNITY): Payer: Self-pay

## 2022-07-10 DIAGNOSIS — M25552 Pain in left hip: Secondary | ICD-10-CM

## 2022-07-10 DIAGNOSIS — M6281 Muscle weakness (generalized): Secondary | ICD-10-CM

## 2022-07-10 NOTE — Therapy (Signed)
OUTPATIENT PHYSICAL THERAPY LOWER EXTREMITY TREATMENT   Patient Name: Jamie Lutz MRN: [id number] DOB:05-Sep-1995, 27 y.o., female Today's Date: 07/10/2022  END OF SESSION:  PT End of Session - 07/10/22 1541     Visit Number 3    Number of Visits 4    Date for PT Re-Evaluation 07/18/22    Authorization Type 100% discount    Progress Note Due on Visit 4    PT Start Time 1527    PT Stop Time 1610    PT Time Calculation (min) 43 min    Activity Tolerance Patient tolerated treatment well    Behavior During Therapy WFL for tasks assessed/performed               Past Medical History:  Diagnosis Date   Anxiety    Depression    GERD (gastroesophageal reflux disease)    Gestational diabetes    Past Surgical History:  Procedure Laterality Date   COLONOSCOPY     PERINEAL LACERATION REPAIR  04/17/2011   Procedure: SUTURE REPAIR PERINEAL LACERATION;  Surgeon: Tilda Burrow, MD;  Location: [facility];  Service: Gynecology;  Laterality: N/A;   UPPER GASTROINTESTINAL ENDOSCOPY     Patient Active Problem List   Diagnosis Date Noted   History of preterm delivery 11/14/2021   Depression with anxiety 06/05/2016   History of gestational diabetes 02/25/2016   Adjustment disorder with anxious mood 11/03/2014   Myofascial abdominal wall pain 05/03/2014   Chronic abdominal pain 05/03/2014   Constipation 05/03/2014   Paresthesia 02/06/2014    PCP: Chyrl Civatte   REFERRING PROVIDER: Oliver Barre, MD  REFERRING DIAG: osis M25.552 (ICD-10-CM) - Pain in left hip [phone] (ICD-10-CM) - Tendinitis of left hip flexor M70.62 (ICD-10-CM) - Greater trochanteric bursitis of left  THERAPY DIAG:  Left hip pain Muscle weakness  Rationale for Evaluation and Treatment: Rehabilitation  ONSET DATE: 11/12/22     SUBJECTIVE STATEMENT: 07/10/22:  Pt reports she has been busy, had colonoscopy on Tuesday due to abdominal pain since March.  Reports achey pain on Lt hip and no radicular  symptoms since last session.  Reports compliance with HEP 3x a week, reports the quadruped exercises are easier on the floor vs bed.    Pt states that she has been having pain from her RT hip down into her anterior thigh ever since she had her baby. The patient states that he pain is constant.  She states that she is unable to lie on her right side.  She has been [place].    PERTINENT HISTORY: Unremarkable  PAIN:  Are you having pain? Yes: current 3/10.  NPRS scale: 6/10, max is 8/10 Pain location: Rt hip  Pain description: aching  Aggravating factors: not sure  Relieving factors: not sure   PRECAUTIONS: None  WEIGHT BEARING RESTRICTIONS: No  FALLS:  Has patient fallen in last 6 months? No  LIVING ENVIRONMENT: Lives with: lives with their family Lives in: House/apartment OCCUPATION: does not work   PLOF: Independent  PATIENT GOALS: Less hip pain   NEXT MD VISIT: no scheduled visit   OBJECTIVE:   DIAGNOSTIC FINDINGS: Impression: Negative AP pelvis   COGNITION: Overall cognitive status: Within functional limits for tasks assessed       POSTURE: No Significant postural limitations Rt hip is high; PSIS:  Rt higher; ASIS lower indicative of SI Dysfunction. Supine to long sit test: supine Rt leg long; long sit shorter indicative of SI dysfunction.  PALPATION: Tender   LOWER EXTREMITY ROM: WFL   LOWER EXTREMITY MMT:  MMT Right eval Left eval  Hip flexion 4/5 4/5  Hip extension 3+/5 3+/5  Hip abduction 4/5 4/5  Hip adduction    Hip internal rotation    Hip external rotation    Knee flexion 3+/5 3+/5  Knee extension    Ankle dorsiflexion    Ankle plantarflexion    Ankle inversion    Ankle eversion     (Blank rows = not tested)   TODAY'S TREATMENT:                                                                                                                              DATE:  07/10/22: Checked SI with no MET required this session Bridge  with Rt foot closer to buttock 10x SLR Lt only with ab set 10x Dead bug with ab set 10x  Quadruped UE/LE 10x 5" with 2# bar on lower back  Standing: Wall squat with ab set 10x 10" holds SLS Rt 30" Lt 30"- increased difficulty with Lt compared to Rt  Vector stance 3x 5" with core engagement    06/27/22: Reviewed goals Educated importance of HEP compliance Checked SI with MET to improve alignement Pubic clearing exercises: Bridges Isometric abd/ add alternating with ball and belt 10x5 " holds  Quadruped UE 5x 5" LE 3x 5"    06/20/2022:  Evaluation Mm energy techniques to improve SI dysfunction - Standing Heel Raises   reps - 5 x5" hold - Mini Squat with Counter Support  5x 5" hold - Sit to Stand  - 5reps - Supine Bridge  -5 - 5" hold - Prone Hip Extension  - 10 5" hold      PATIENT EDUCATION:  Education details: HEP Person educated: Patient Education method: Handouts Education comprehension: verbalized understanding  HOME EXERCISE PROGRAM: Access Code: [email or web address] URL: [email or web address] Date: 06/20/2022 Prepared by: Virgina Organ  Exercises - Standing Heel Raises  - 2 x daily - 7 x weekly - 1 sets - 10 reps - 5" hold - Mini Squat with Counter Support  - 2 x daily - 7 x weekly - 1 sets - 10 reps - 5" hold - Sit to Stand  - 2 x daily - 7 x weekly - 1 sets - 10 reps - Supine Bridge  - 2 x daily - 7 x weekly - 1 sets - 10 reps - 5" hold - Prone Hip Extension  - 2 x daily - 7 x weekly - 1 sets - 10 reps - 5" hold  - Bird Dog  - 1 x daily - 7 x weekly - 3 sets - 10 reps - 3-5" hold - Wall Squat  - 1 x daily - 7 x weekly - 3 sets - 5 reps - 10" hold - Standing 3-Way Kick  - 1 x daily - 7 x weekly - 3 sets - 5 reps - 5"  hold  ASSESSMENT:  CLINICAL IMPRESSION: 6/520/24:  Session focus with core stability.  Checked SI with no MET required this session.  Pt presents with improved control with quadruped exercises and tolerated well with additional  exercises added this sessioin, required min verbal and tactile cueing for core engangement prior movement.  Reports pain reduced at EOS.    Patient is a 27 y.o. female who was seen today for physical therapy evaluation and treatment for Left hip pain. 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  Ms Jamie Mangone will benefit from skilled PT to address these issues and decrease her pain to improve her functioning ability.   OBJECTIVE IMPAIRMENTS: decreased balance, difficulty walking, decreased strength, and pain.   ACTIVITY LIMITATIONS: carrying, lifting, squatting, stairs, and locomotion level  PARTICIPATION LIMITATIONS: cleaning, shopping, and community activity   REHAB POTENTIAL: Good  CLINICAL DECISION MAKING: Stable/uncomplicated  EVALUATION COMPLEXITY: Low   GOALS: Goals reviewed with patient? No  SHORT TERM GOALS: Target date: 07/04/22 PT to be I in HEP in order to decrease her Lt hip pain to no greater than a 4/10 Baseline: Goal status: IN PROGRESS  2.  Pt Lt hip and core mm to be improved by 1/2 grade complete her housework without  increased pain. Baseline:  Goal status: IN PROGRESS  3.  PT SI to be in correct alignment Baseline:  Goal status: IN PROGRESS   LONG TERM GOALS: Target date: 08/20/22  PT to be I in HEP in an advanced HEP in order to decrease her Lt hip pain to no greater than a 2/10  Baseline:  Goal status: IN PROGRESS  2.   Pt  hip and core  mm to be improved by 1 grade to be able to complete a full day activity without having to take a break. Baseline:  Goal status: IN PROGRESS  3.  PT to be able to lie on her right side without increased pain for sleeping. Baseline: 06/27/22:  discomfort in Left side Goal status: IN PROGRESS   PLAN:  PT FREQUENCY: 1x/week  PT DURATION: 4 weeks  PLANNED INTERVENTIONS: Therapeutic exercises, Balance training, Patient/Family education, Self Care, and Manual  therapy  PLAN FOR NEXT SESSION:  Reassess next session.  Check SI, progress core, advance HEP.  Becky Sax, LPTA/CLT; CBIS [phone]  Juel Burrow, PTA 07/10/2022, 4:22 PM

## 2022-07-15 ENCOUNTER — Encounter (HOSPITAL_COMMUNITY): Payer: Self-pay | Admitting: Physical Therapy

## 2022-07-17 ENCOUNTER — Ambulatory Visit: Payer: Self-pay | Admitting: Nurse Practitioner

## 2022-07-25 ENCOUNTER — Ambulatory Visit (HOSPITAL_COMMUNITY): Payer: Self-pay | Attending: Orthopedic Surgery | Admitting: Physical Therapy

## 2022-07-25 ENCOUNTER — Encounter (HOSPITAL_COMMUNITY): Payer: Self-pay | Admitting: Physical Therapy

## 2022-07-25 DIAGNOSIS — M6281 Muscle weakness (generalized): Secondary | ICD-10-CM | POA: Insufficient documentation

## 2022-07-25 DIAGNOSIS — M25552 Pain in left hip: Secondary | ICD-10-CM | POA: Insufficient documentation

## 2022-07-25 NOTE — Therapy (Signed)
OUTPATIENT PHYSICAL THERAPY LOWER EXTREMITY TREATMENT/ progress    Patient Name: Jamie Lutz MRN: [id number] DOB:31-Jul-1995, 27 y.o., female Today's Date: 07/25/2022  Progress Note Reporting Period 5/31/ to 7/5  See note below for Objective Data and Assessment of Progress/Goals.     END OF SESSION:  PT End of Session - 07/25/22 1030    Visit Number 4    Number of Visits 8    Date for PT Re-Evaluation 08/24/22    Authorization Type 100% discount    Progress Note Due on Visit 8    PT Start Time 1000    PT Stop Time 1030    PT Time Calculation (min) 30 min    Activity Tolerance Patient tolerated treatment well    Behavior During Therapy WFL for tasks assessed/performed                Past Medical History:  Diagnosis Date   Anxiety    Depression    GERD (gastroesophageal reflux disease)    Gestational diabetes    Past Surgical History:  Procedure Laterality Date   COLONOSCOPY     PERINEAL LACERATION REPAIR  04/17/2011   Procedure: SUTURE REPAIR PERINEAL LACERATION;  Surgeon: Tilda Burrow, MD;  Location: [facility];  Service: Gynecology;  Laterality: N/A;   UPPER GASTROINTESTINAL ENDOSCOPY     Patient Active Problem List   Diagnosis Date Noted   History of preterm delivery 11/14/2021   Depression with anxiety 06/05/2016   History of gestational diabetes 02/25/2016   Adjustment disorder with anxious mood 11/03/2014   Myofascial abdominal wall pain 05/03/2014   Chronic abdominal pain 05/03/2014   Constipation 05/03/2014   Paresthesia 02/06/2014    PCP: Chyrl Civatte   REFERRING PROVIDER: Oliver Barre, MD  REFERRING DIAG: osis M25.552 (ICD-10-CM) - Pain in left hip [phone] (ICD-10-CM) - Tendinitis of left hip flexor M70.62 (ICD-10-CM) - Greater trochanteric bursitis of left  THERAPY DIAG:  Left hip pain Muscle weakness  Rationale for Evaluation and Treatment: Rehabilitation  ONSET DATE: 11/12/22     SUBJECTIVE STATEMENT: Pt states she  is doing her exercises pain is at a 3/10 pain went as high as a 9/10 when she was sleeping.   Pt states that she has been having pain from her RT hip down into her anterior thigh ever since she had her baby. The patient states that he pain is constant.  She states that she is unable to lie on her right side.  She has been [place].    PERTINENT HISTORY: Unremarkable  PAIN:  Are you having pain? Yes: current 3/10.  NPRS scale: 6/10, max is 8/10 Pain location: Rt hip  Pain description: aching  Aggravating factors: not sure  Relieving factors: not sure   PRECAUTIONS: None  WEIGHT BEARING RESTRICTIONS: No  FALLS:  Has patient fallen in last 6 months? No  LIVING ENVIRONMENT: Lives with: lives with their family Lives in: House/apartment OCCUPATION: does not work   PLOF: Independent  PATIENT GOALS: Less hip pain   NEXT MD VISIT: no scheduled visit   OBJECTIVE:   DIAGNOSTIC FINDINGS: Impression: Negative AP pelvis   COGNITION: Overall cognitive status: Within functional limits for tasks assessed       POSTURE: No Significant postural limitations Rt hip is high; PSIS:  Rt higher; ASIS lower indicative of SI Dysfunction. Supine to long sit test: supine Rt leg long; long sit shorter indicative of SI dysfunction.  PALPATION: Tender   LOWER  EXTREMITY ROM: [facility]   LOWER EXTREMITY MMT:  MMT Right eval 07/25/22 Left eval 07/25/22  Hip flexion 4/5 5/5 4/5 5/5  Hip extension 3+/5 4-/5 3+/5 4-/5  Hip abduction 4/5 5/5 4/5 5/5  Hip adduction      Hip internal rotation      Hip external rotation      Knee flexion 3+/5 4/5 3+/5 4/5  Knee extension      Ankle dorsiflexion      Ankle plantarflexion      Ankle inversion      Ankle eversion       (Blank rows = not tested) Sit to stand:  30 seconds x 14 ( 22 is poor for pt age and sex .    TODAY'S TREATMENT:                                                                                                                               DATE:  07/25/22:reassess see above  SI in line no adjustment needed.   Sit to stand x 10 Hip excursion x 3  Standing heel raise x 15 Cybex knee flexion 3 PL x 15 Body craft leg press 4 pl x 15   07/10/22: Checked SI with no MET required this session Bridge with Rt foot closer to buttock 10x SLR Lt only with ab set 10x Dead bug with ab set 10x  Quadruped UE/LE 10x 5" with 2# bar on lower back  Standing: Wall squat with ab set 10x 10" holds SLS Rt 30" Lt 30"- increased difficulty with Lt compared to Rt  Vector stance 3x 5" with core engagement    06/27/22: Reviewed goals Educated importance of HEP compliance Checked SI with MET to improve alignement Pubic clearing exercises: Bridges Isometric abd/ add alternating with ball and belt 10x5 " holds  Quadruped UE 5x 5" LE 3x 5"    06/20/2022:  Evaluation Mm energy techniques to improve SI dysfunction - Standing Heel Raises   reps - 5 x5" hold - Mini Squat with Counter Support  5x 5" hold - Sit to Stand  - 5reps - Supine Bridge  -5 - 5" hold - Prone Hip Extension  - 10 5" hold      PATIENT EDUCATION:  Education details: HEP Person educated: Patient Education method: Handouts Education comprehension: verbalized understanding  HOME EXERCISE PROGRAM: Access Code: [email or web address] URL: [email or web address] Date: 06/20/2022 Prepared by: Virgina Organ  Exercises - Standing Heel Raises  - 2 x daily - 7 x weekly - 1 sets - 10 reps - 5" hold - Mini Squat with Counter Support  - 2 x daily - 7 x weekly - 1 sets - 10 reps - 5" hold - Sit to Stand  - 2 x daily - 7 x weekly - 1 sets - 10 reps - Supine Bridge  - 2 x daily - 7 x weekly - 1 sets - 10 reps - 5" hold - Prone  Hip Extension  - 2 x daily - 7 x weekly - 1 sets - 10 reps - 5" hold  - Bird Dog  - 1 x daily - 7 x weekly - 3 sets - 10 reps - 3-5" hold - Wall Squat  - 1 x daily - 7 x weekly - 3 sets - 5 reps - 10" hold - Standing 3-Way Kick  - 1  x daily - 7 x weekly - 3 sets - 5 reps - 5" hold  ASSESSMENT:  CLINICAL IMPRESSION: Pt reassess her SI is in proper alignment.  PT LE strength has improved but continue to be weak.  PT pain has decreased overall but continues to have bouts of high pain.  Pt is able to complete more housework without having to rest but still needs to rest.  PT continues to have decreased activity tolerance, decreased strength, and increased pain and will continue to benefit from skilled PT once a week for four more weeks.  OBJECTIVE IMPAIRMENTS: decreased balance, difficulty walking, decreased strength, and pain.   ACTIVITY LIMITATIONS: carrying, lifting, squatting, stairs, and locomotion level  PARTICIPATION LIMITATIONS: cleaning, shopping, and community activity   REHAB POTENTIAL: Good  CLINICAL DECISION MAKING: Stable/uncomplicated  EVALUATION COMPLEXITY: Low   GOALS: Goals reviewed with patient? No  SHORT TERM GOALS: Target date: 07/04/22 PT to be I in HEP in order to decrease her Lt hip pain to no greater than a 4/10 Baseline: Goal status: IN PROGRESS  2.  Pt Lt hip and core mm to be improved by 1/2 grade complete her housework without  increased pain. Baseline:  Goal status: IN PROGRESS  3.  PT SI to be in correct alignment Baseline:  Goal status: MET   LONG TERM GOALS: Target date: 08/20/22  PT to be I in HEP in an advanced HEP in order to decrease her Lt hip pain to no greater than a 2/10  Baseline:  Goal status: IN PROGRESS  2.   Pt  hip and core  mm to be improved by 1 grade to be able to complete a full day activity without having to take a break. Baseline:  Goal status: IN PROGRESS  3.  PT to be able to lie on her right side without increased pain for sleeping. Baseline: 06/27/22:  discomfort in Left side Goal status: IN PROGRESS   PLAN:  PT FREQUENCY: 1x/week  PT DURATION: 4 weeks; additional 4 weeks   PLANNED INTERVENTIONS: Therapeutic exercises, Balance training,  Patient/Family education, Self Care, and Manual therapy  PLAN FOR NEXT SESSION:  Continue with lumbar stab at higher level ; begin postural theraband and paloff exercises.  Virgina Organ, PT CLT [phone]  07/25/2022, 10:33 AM

## 2022-08-15 ENCOUNTER — Ambulatory Visit (HOSPITAL_COMMUNITY): Payer: Self-pay | Admitting: Physical Therapy

## 2022-08-19 ENCOUNTER — Encounter (HOSPITAL_COMMUNITY): Payer: Self-pay | Admitting: Physical Therapy

## 2022-08-29 ENCOUNTER — Encounter (HOSPITAL_COMMUNITY): Payer: Self-pay | Admitting: Physical Therapy

## 2022-09-05 ENCOUNTER — Encounter (HOSPITAL_COMMUNITY): Payer: Self-pay

## 2022-09-09 NOTE — Addendum Note (Signed)
Addended by: Bella Kennedy on: 09/09/2022 08:16 AM   Modules accepted: Orders

## 2023-01-30 ENCOUNTER — Emergency Department (HOSPITAL_COMMUNITY): Payer: Self-pay

## 2023-01-30 ENCOUNTER — Encounter (HOSPITAL_COMMUNITY): Payer: Self-pay

## 2023-01-30 ENCOUNTER — Other Ambulatory Visit: Payer: Self-pay

## 2023-01-30 ENCOUNTER — Emergency Department (HOSPITAL_COMMUNITY)
Admission: EM | Admit: 2023-01-30 | Discharge: 2023-01-31 | Disposition: A | Discharge status: Home / Self Care | Payer: Self-pay | Attending: Emergency Medicine | Admitting: Emergency Medicine

## 2023-01-30 DIAGNOSIS — M542 Cervicalgia: Secondary | ICD-10-CM | POA: Diagnosis present

## 2023-01-30 DIAGNOSIS — Y92410 Unspecified street and highway as the place of occurrence of the external cause: Secondary | ICD-10-CM | POA: Diagnosis not present

## 2023-01-30 DIAGNOSIS — R519 Headache, unspecified: Secondary | ICD-10-CM | POA: Diagnosis not present

## 2023-01-30 DIAGNOSIS — M549 Dorsalgia, unspecified: Secondary | ICD-10-CM | POA: Diagnosis not present

## 2023-01-30 DIAGNOSIS — T07XXXA Unspecified multiple injuries, initial encounter: Secondary | ICD-10-CM

## 2023-01-30 LAB — POC URINE PREG, ED: Preg Test, Ur: NEGATIVE

## 2023-01-30 MED ORDER — IBUPROFEN 400 MG PO TABS
600.0000 mg | ORAL_TABLET | Freq: Once | ORAL | Status: AC
Start: 2023-01-30 — End: 2023-01-30
  Administered 2023-01-30: 600 mg via ORAL
  Filled 2023-01-30: qty 2
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
## 2023-01-31 NOTE — Discharge Instructions (Signed)
 Apply ice to sore areas.  Ice should be applied for 30 minutes at a time, 4 times a day.  You may take ibuprofen  or naproxen  as needed for pain.  For additional pain relief, add acetaminophen .  When you combine acetaminophen  with either ibuprofen  or naproxen , you get better pain relief than you get from taking either medication by itself.

## 2023-07-31 ENCOUNTER — Encounter (HOSPITAL_COMMUNITY): Payer: Self-pay | Admitting: *Deleted

## 2023-07-31 ENCOUNTER — Emergency Department (HOSPITAL_COMMUNITY)
Admission: EM | Admit: 2023-07-31 | Discharge: 2023-08-01 | Disposition: A | Discharge status: Home / Self Care | Payer: Self-pay | Attending: Emergency Medicine | Admitting: Emergency Medicine

## 2023-07-31 ENCOUNTER — Other Ambulatory Visit: Payer: Self-pay

## 2023-07-31 DIAGNOSIS — S060X0A Concussion without loss of consciousness, initial encounter: Secondary | ICD-10-CM | POA: Insufficient documentation

## 2023-07-31 DIAGNOSIS — M25561 Pain in right knee: Secondary | ICD-10-CM | POA: Diagnosis not present

## 2023-07-31 DIAGNOSIS — S0990XA Unspecified injury of head, initial encounter: Secondary | ICD-10-CM | POA: Diagnosis present

## 2023-07-31 DIAGNOSIS — M542 Cervicalgia: Secondary | ICD-10-CM | POA: Diagnosis not present

## 2023-07-31 DIAGNOSIS — W01198A Fall on same level from slipping, tripping and stumbling with subsequent striking against other object, initial encounter: Secondary | ICD-10-CM | POA: Insufficient documentation

## 2023-07-31 DIAGNOSIS — W19XXXA Unspecified fall, initial encounter: Secondary | ICD-10-CM

## 2023-07-31 DIAGNOSIS — M545 Low back pain, unspecified: Secondary | ICD-10-CM | POA: Diagnosis not present
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
## 2023-08-01 ENCOUNTER — Emergency Department (HOSPITAL_COMMUNITY): Payer: Self-pay

## 2023-08-01 MED ORDER — OXYCODONE HCL 5 MG PO TABS
5.0000 mg | ORAL_TABLET | Freq: Once | ORAL | Status: AC
  Administered 2023-08-01: 5 mg via ORAL
  Filled 2023-08-01: qty 1

## 2023-08-01 MED ORDER — ACETAMINOPHEN 325 MG PO TABS
650.0000 mg | ORAL_TABLET | Freq: Once | ORAL | Status: AC
  Administered 2023-08-01: 650 mg via ORAL
  Filled 2023-08-01: qty 2

## 2023-08-01 NOTE — Discharge Instructions (Signed)
 You were evaluated in the Emergency Department and after careful evaluation, we did not find any emergent condition requiring admission or further testing in the hospital.  Your exam/testing today is overall reassuring.  Symptoms may be due to a concussion.  Recommend mental and physical rest as we discussed.  Please return to the Emergency Department if you experience any worsening of your condition.   Thank you for allowing us  to be a part of your care.

## 2023-08-01 NOTE — ED Provider Notes (Signed)
 [facility]-EMERGENCY DEPT [facility] Emergency Department Provider Note MRN:  [id number]  Arrival date & time: 08/01/23     Chief Complaint   Fall   History of Present Illness   Jamie Lutz is a 28 y.o. year-old female with a history of anxiety, depression presenting to the ED with chief complaint of fall.  Slipped on wet floor and fell backwards hitting her head.  Endorsing headache, nausea, posterior neck pain.  Denies chest pain or shortness of breath, mild left lumbar pain, mild right knee pain.  No abdominal pain.  Review of Systems  A thorough review of systems was obtained and all systems are negative except as noted in the HPI and PMH.   Patient's Health History    Past Medical History:  Diagnosis Date   Anxiety    Depression    GERD (gastroesophageal reflux disease)    Gestational diabetes     Past Surgical History:  Procedure Laterality Date   COLONOSCOPY     PERINEAL LACERATION REPAIR  04/17/2011   Procedure: SUTURE REPAIR PERINEAL LACERATION;  Surgeon: Norleen LULLA Server, MD;  Location: [facility];  Service: Gynecology;  Laterality: N/A;   UPPER GASTROINTESTINAL ENDOSCOPY      Family History  Problem Relation Age of Onset   Hypothyroidism Mother    Yvone' disease Mother    Hypothyroidism Sister    Other Maternal Uncle        has a colostomy bag but pt don't know the diagnosis   Diabetes Maternal Grandmother    Esophageal cancer Maternal Grandfather    Colon cancer Neg Hx    Rectal cancer Neg Hx    Stomach cancer Neg Hx     Social History   Socioeconomic History   Marital status: Married    Spouse name: Millard   Number of children: 5   Years of education: Not on file   Highest education level: High school graduate  Occupational History   Not on file  Tobacco Use   Smoking status: Never   Smokeless tobacco: Never  Vaping Use   Vaping status: Never Used  Substance and Sexual Activity   Alcohol use: No    Alcohol/week: 0.0 standard drinks of  alcohol   Drug use: No   Sexual activity: Yes    Birth control/protection: None  Other Topics Concern   Not on file  Social History Narrative   Not on file   Social Drivers of Health   Financial Resource Strain: Low Risk  (02/02/2023)   Received from [facility]   Overall Financial Resource Strain (CARDIA)    Difficulty of Paying Living Expenses: Not hard at all  Food Insecurity: No Food Insecurity (02/02/2023)   Received from [facility]   Hunger Vital Sign    Within the past 12 months, you worried that your food would run out before you got the money to buy more.: Never true    Within the past 12 months, the food you bought just didn't last and you didn't have money to get more.: Never true  Transportation Needs: No Transportation Needs (02/02/2023)   Received from [facility] - Transportation    Lack of Transportation (Medical): No    Lack of Transportation (Non-Medical): No  Physical Activity: Insufficiently Active (05/13/2022)   Received from [facility]   Exercise Vital Sign    On average, how many days per week do you engage in moderate to strenuous exercise (like a brisk walk)?: 5  days    On average, how many minutes do you engage in exercise at this level?: 20 min  Stress: No Stress Concern Present (05/13/2022)   Received from [facility] - Occupational Stress Questionnaire    Feeling of Stress : Only a little  Social Connections: Moderately Integrated (05/13/2022)   Received from [facility]   Social Network    How would you rate your social network (family, work, friends)?: Adequate participation with social networks  Intimate Partner Violence: Not At Risk (05/13/2022)   Received from [facility]   HITS    Over the last 12 months how often did your partner physically hurt you?: Never    Over the last 12 months how often did your partner insult you or talk down to you?: Never    Over the last 12 months  how often did your partner threaten you with physical harm?: Never    Over the last 12 months how often did your partner scream or curse at you?: Never     Physical Exam   Vitals:   08/01/23 0252 08/01/23 0607  BP: 106/73 94/62  Pulse: (!) 59 72  Resp: 16 17  Temp: 98 F (36.7 C) 97.7 F (36.5 C)  SpO2: 100% 100%    CONSTITUTIONAL: Well-appearing, NAD NEURO/PSYCH:  Alert and oriented x 3, no focal deficits EYES:  eyes equal and reactive ENT/NECK:  no LAD, no JVD CARDIO: Regular rate, well-perfused, normal S1 and S2 PULM:  CTAB no wheezing or rhonchi GI/GU:  non-distended, non-tender MSK/SPINE:  No gross deformities, no edema SKIN:  no rash, atraumatic   *Additional and/or pertinent findings included in MDM below  Diagnostic and Interventional Summary    EKG Interpretation Date/Time:    Ventricular Rate:    PR Interval:    QRS Duration:    QT Interval:    QTC Calculation:   R Axis:      Text Interpretation:         Labs Reviewed - No data to display  CT HEAD WO CONTRAST ( )  Final Result    CT CERVICAL SPINE WO CONTRAST  Final Result      Medications  acetaminophen  (TYLENOL ) tablet 650 mg (650 mg Oral Given 08/01/23 0304)  oxyCODONE  (Oxy IR/ROXICODONE ) immediate release tablet 5 mg (5 mg Oral Given 08/01/23 0454)     Procedures  /  Critical Care Procedures  ED Course and Medical Decision Making  Initial Impression and Ddx Patient has some midline spinal tenderness and hesitancy to move the neck.  Obtaining CT cervical spine.  Will also obtain CT head to exclude intracranial bleeding.  No other midline tenderness to the spine, lungs clear and present in all fields, abdomen soft.  Normal range of motion of the knee.  Past medical/surgical history that increases complexity of ED encounter: None  Interpretation of Diagnostics I personally reviewed the CT head and cervical spine and my interpretation is as follows: No traumatic injuries    Patient  Reassessment and Ultimate Disposition/Management     Discharge  Patient management required discussion with the following services or consulting groups:  None  Complexity of Problems Addressed Acute illness or injury that poses threat of life of bodily function  Additional Data Reviewed and Analyzed Further history obtained from: Further history from spouse/family member  Additional Factors Impacting ED Encounter Risk None  Ozell HERO. Theadore, MD [facility] Emergency [facility] [email or web address]  Final Clinical  Impressions(s) / ED Diagnoses     ICD-10-CM   1. Fall, initial encounter  W19.XXXA     2. Concussion without loss of consciousness, initial encounter  S06.0X0A     3. Neck pain  M54.2       ED Discharge Orders     None        Discharge Instructions Discussed with and Provided to Patient:     Discharge Instructions      You were evaluated in the Emergency Department and after careful evaluation, we did not find any emergent condition requiring admission or further testing in the hospital.  Your exam/testing today is overall reassuring.  Symptoms may be due to a concussion.  Recommend mental and physical rest as we discussed.  Please return to the Emergency Department if you experience any worsening of your condition.   Thank you for allowing us  to be a part of your care.       Theadore Ozell HERO, MD 08/01/23 [phone]

## 2023-08-01 NOTE — ED Notes (Signed)
 This RN reviewed discharge instructions with patient. She verbalized understanding and denied any further questions. PT well appearing upon discharge and reports tolerable pain. Pt ambulated with stable gait to exit. Pt endorses ride home.
